# Patient Record
Sex: Male | Born: 1980 | Race: Black or African American | Hispanic: Refuse to answer | Marital: Married | Smoking: Never smoker
Health system: Southern US, Community
[De-identification: ages and names within clinical notes are randomized; demographics above are authoritative.]

## PROBLEM LIST (undated history)

## (undated) DIAGNOSIS — I10 Essential (primary) hypertension: Secondary | ICD-10-CM

## (undated) DIAGNOSIS — E669 Obesity, unspecified: Secondary | ICD-10-CM

## (undated) DIAGNOSIS — T7840XA Allergy, unspecified, initial encounter: Secondary | ICD-10-CM

## (undated) DIAGNOSIS — E201 Pseudohypoparathyroidism: Secondary | ICD-10-CM

## (undated) HISTORY — DX: Allergy, unspecified, initial encounter: T78.40XA

---

## 2004-08-02 DIAGNOSIS — I10 Essential (primary) hypertension: Secondary | ICD-10-CM

## 2004-08-02 HISTORY — DX: Essential (primary) hypertension: I10

## 2014-08-12 ENCOUNTER — Emergency Department (HOSPITAL_COMMUNITY): Payer: BC Managed Care – PPO

## 2014-08-12 ENCOUNTER — Emergency Department (HOSPITAL_COMMUNITY)
Admission: EM | Admit: 2014-08-12 | Discharge: 2014-08-12 | Disposition: A | Discharge status: Home / Self Care | Payer: BC Managed Care – PPO | Attending: Emergency Medicine | Admitting: Emergency Medicine

## 2014-08-12 ENCOUNTER — Encounter (HOSPITAL_COMMUNITY): Payer: Self-pay | Admitting: Family Medicine

## 2014-08-12 DIAGNOSIS — I10 Essential (primary) hypertension: Secondary | ICD-10-CM | POA: Insufficient documentation

## 2014-08-12 DIAGNOSIS — J189 Pneumonia, unspecified organism: Secondary | ICD-10-CM

## 2014-08-12 DIAGNOSIS — J159 Unspecified bacterial pneumonia: Secondary | ICD-10-CM | POA: Diagnosis not present

## 2014-08-12 DIAGNOSIS — E669 Obesity, unspecified: Secondary | ICD-10-CM | POA: Insufficient documentation

## 2014-08-12 DIAGNOSIS — R059 Cough, unspecified: Secondary | ICD-10-CM

## 2014-08-12 DIAGNOSIS — R05 Cough: Secondary | ICD-10-CM

## 2014-08-12 HISTORY — DX: Essential (primary) hypertension: I10

## 2014-08-12 LAB — BASIC METABOLIC PANEL
Anion gap: 8 (ref 5–15)
BUN: <5 mg/dL — ABNORMAL LOW (ref 6–23)
CHLORIDE: 97 meq/L (ref 96–112)
CO2: 30 mmol/L (ref 19–32)
Calcium: 6.4 mg/dL — CL (ref 8.4–10.5)
Creatinine, Ser: 0.88 mg/dL (ref 0.50–1.35)
GFR calc non Af Amer: >90 mL/min (ref >90)
GLUCOSE: 126 mg/dL — AB (ref 70–99)
Potassium: 3.2 mmol/L — ABNORMAL LOW (ref 3.5–5.1)
SODIUM: 135 mmol/L (ref 135–145)

## 2014-08-12 LAB — BRAIN NATRIURETIC PEPTIDE: B Natriuretic Peptide: 48.4 pg/mL (ref 0.0–100.0)

## 2014-08-12 LAB — CBC
HCT: 40 % (ref 39.0–52.0)
HEMOGLOBIN: 13.4 g/dL (ref 13.0–17.0)
MCH: 30.1 pg (ref 26.0–34.0)
MCHC: 33.5 g/dL (ref 30.0–36.0)
MCV: 89.9 fL (ref 78.0–100.0)
Platelets: 287 10*3/uL (ref 150–400)
RBC: 4.45 MIL/uL (ref 4.22–5.81)
RDW: 14.9 % (ref 11.5–15.5)
WBC: 12.3 10*3/uL — ABNORMAL HIGH (ref 4.0–10.5)

## 2014-08-12 LAB — I-STAT TROPONIN, ED: TROPONIN I, POC: 0 ng/mL (ref 0.00–0.08)

## 2014-08-12 MED ORDER — AZITHROMYCIN 250 MG PO TABS
500.0000 mg | ORAL_TABLET | Freq: Once | ORAL | Status: AC
  Administered 2014-08-12: 500 mg via ORAL
  Filled 2014-08-12: qty 2

## 2014-08-12 MED ORDER — AZITHROMYCIN 250 MG PO TABS: 250.0000 mg | ORAL_TABLET | Freq: Every day | ORAL | Status: DC

## 2014-08-12 MED ORDER — ACETAMINOPHEN 325 MG PO TABS
650.0000 mg | ORAL_TABLET | Freq: Once | ORAL | Status: AC
  Administered 2014-08-12: 650 mg via ORAL
  Filled 2014-08-12: qty 2

## 2014-08-12 MED ORDER — AMLODIPINE BESYLATE 5 MG PO TABS: 5.0000 mg | ORAL_TABLET | Freq: Every day | ORAL | Status: DC

## 2014-08-12 MED ORDER — AMLODIPINE BESYLATE 5 MG PO TABS
5.0000 mg | ORAL_TABLET | Freq: Once | ORAL | Status: AC
  Administered 2014-08-12: 5 mg via ORAL
  Filled 2014-08-12: qty 1

## 2014-08-12 NOTE — ED Notes (Signed)
PT returned from scans. Monitored by pulse ox, bp cuff, and 5-lead. 

## 2014-08-12 NOTE — ED Notes (Signed)
Pt sent here from [facility] with BP in the 200s. sts hx of hypertension not taking any meds. sts a cough and some hoarseness but denies chest pain, SOB.

## 2014-08-12 NOTE — ED Notes (Signed)
Pt given education on medications. MD aware of Pts BP and had prescribed medication

## 2014-08-12 NOTE — Discharge Instructions (Signed)
Pneumonia °Pneumonia is an infection of the lungs.  °CAUSES °Pneumonia may be caused by bacteria or a virus. Usually, these infections are caused by breathing infectious particles into the lungs (respiratory tract). °SIGNS AND SYMPTOMS  °· Cough. °· Fever. °· Chest pain. °· Increased rate of breathing. °· Wheezing. °· Mucus production. °DIAGNOSIS  °If you have the common symptoms of pneumonia, your health care provider will typically confirm the diagnosis with a chest X-ray. The X-ray will show an abnormality in the lung (pulmonary infiltrate) if you have pneumonia. Other tests of your blood, urine, or sputum may be done to find the specific cause of your pneumonia. Your health care provider may also do tests (blood gases or pulse oximetry) to see how well your lungs are working. °TREATMENT  °Some forms of pneumonia may be spread to other people when you cough or sneeze. You may be asked to wear a mask before and during your exam. Pneumonia that is caused by bacteria is treated with antibiotic medicine. Pneumonia that is caused by the influenza virus may be treated with an antiviral medicine. Most other viral infections must run their course. These infections will not respond to antibiotics.  °HOME CARE INSTRUCTIONS  °· Cough suppressants may be used if you are losing too much rest. However, coughing protects you by clearing your lungs. You should avoid using cough suppressants if you can. °· Your health care provider may have prescribed medicine if he or she thinks your pneumonia is caused by bacteria or influenza. Finish your medicine even if you start to feel better. °· Your health care provider may also prescribe an expectorant. This loosens the mucus to be coughed up. °· Take medicines only as directed by your health care provider. °· Do not smoke. Smoking is a common cause of bronchitis and can contribute to pneumonia. If you are a smoker and continue to smoke, your cough may last several weeks after your  pneumonia has cleared. °· A cold steam vaporizer or humidifier in your room or home may help loosen mucus. °· Coughing is often worse at night. Sleeping in a semi-upright position in a recliner or using a couple pillows under your head will help with this. °· Get rest as you feel it is needed. Your body will usually let you know when you need to rest. °PREVENTION °A pneumococcal shot (vaccine) is available to prevent a common bacterial cause of pneumonia. This is usually suggested for: °· People over 65 years old. °· Patients on chemotherapy. °· People with chronic lung problems, such as bronchitis or emphysema. °· People with immune system problems. °If you are over 65 or have a high risk condition, you may receive the pneumococcal vaccine if you have not received it before. In some countries, a routine influenza vaccine is also recommended. This vaccine can help prevent some cases of pneumonia. You may be offered the influenza vaccine as part of your care. °If you smoke, it is time to quit. You may receive instructions on how to stop smoking. Your health care provider can provide medicines and counseling to help you quit. °SEEK MEDICAL CARE IF: °You have a fever. °SEEK IMMEDIATE MEDICAL CARE IF:  °· Your illness becomes worse. This is especially true if you are elderly or weakened from any other disease. °· You cannot control your cough with suppressants and are losing sleep. °· You begin coughing up blood. °· You develop pain which is getting worse or is uncontrolled with medicines. °· Any of the symptoms   which initially brought you in for treatment are getting worse rather than better.  You develop shortness of breath or chest pain. MAKE SURE YOU:   Understand these instructions.  Will watch your condition.  Will get help right away if you are not doing well or get worse. Document Released: 07/19/2005 Document Revised: 12/03/2013 Document Reviewed: 10/08/2010 [facility] Patient Information 2015  [place], [place]. This information is not intended to replace advice given to you by your health care provider. Make sure you discuss any questions you have with your health care provider.  Hypertension Hypertension, commonly called high blood pressure, is when the force of blood pumping through your arteries is too strong. Your arteries are the blood vessels that carry blood from your heart throughout your body. A blood pressure reading consists of a higher number over a lower number, such as 110/72. The higher number (systolic) is the pressure inside your arteries when your heart pumps. The lower number (diastolic) is the pressure inside your arteries when your heart relaxes. Ideally you want your blood pressure below 120/80. Hypertension forces your heart to work harder to pump blood. Your arteries may become narrow or stiff. Having hypertension puts you at risk for heart disease, stroke, and other problems.  RISK FACTORS Some risk factors for high blood pressure are controllable. Others are not.  Risk factors you cannot control include:   Race. You may be at higher risk if you are African American.  Age. Risk increases with age.  Gender. Men are at higher risk than women before age 16 years. After age 65, women are at higher risk than men. Risk factors you can control include:  Not getting enough exercise or physical activity.  Being overweight.  Getting too much fat, sugar, calories, or salt in your diet.  Drinking too much alcohol. SIGNS AND SYMPTOMS Hypertension does not usually cause signs or symptoms. Extremely high blood pressure (hypertensive crisis) may cause headache, anxiety, shortness of breath, and nosebleed. DIAGNOSIS  To check if you have hypertension, your health care provider will measure your blood pressure while you are seated, with your arm held at the level of your heart. It should be measured at least twice using the same arm. Certain conditions can cause a difference  in blood pressure between your right and left arms. A blood pressure reading that is higher than normal on one occasion does not mean that you need treatment. If one blood pressure reading is high, ask your health care provider about having it checked again. TREATMENT  Treating high blood pressure includes making lifestyle changes and possibly taking medicine. Living a healthy lifestyle can help lower high blood pressure. You may need to change some of your habits. Lifestyle changes may include:  Following the DASH diet. This diet is high in fruits, vegetables, and whole grains. It is low in salt, red meat, and added sugars.  Getting at least 2 hours of brisk physical activity every week.  Losing weight if necessary.  Not smoking.  Limiting alcoholic beverages.  Learning ways to reduce stress. If lifestyle changes are not enough to get your blood pressure under control, your health care provider may prescribe medicine. You may need to take more than one. Work closely with your health care provider to understand the risks and benefits. HOME CARE INSTRUCTIONS  Have your blood pressure rechecked as directed by your health care provider.   Take medicines only as directed by your health care provider. Follow the directions carefully. Blood pressure medicines  must be taken as prescribed. The medicine does not work as well when you skip doses. Skipping doses also puts you at risk for problems.   Do not smoke.   Monitor your blood pressure at home as directed by your health care provider. SEEK MEDICAL CARE IF:   You think you are having a reaction to medicines taken.  You have recurrent headaches or feel dizzy.  You have swelling in your ankles.  You have trouble with your vision. SEEK IMMEDIATE MEDICAL CARE IF:  You develop a severe headache or confusion.  You have unusual weakness, numbness, or feel faint.  You have severe chest or abdominal pain.  You vomit  repeatedly.  You have trouble breathing. MAKE SURE YOU:   Understand these instructions.  Will watch your condition.  Will get help right away if you are not doing well or get worse. Document Released: 07/19/2005 Document Revised: 12/03/2013 Document Reviewed: 05/11/2013 [facility] Patient Information 2015 [place], [place]. This information is not intended to replace advice given to you by your health care provider. Make sure you discuss any questions you have with your health care provider.

## 2014-08-12 NOTE — ED Provider Notes (Signed)
CSN: [id number]     Arrival date & time 08/12/14  1702 History   First MD Initiated Contact with Patient 08/12/14 1809     Chief Complaint  Patient presents with  . Cough  . Hypertension     (Consider location/radiation/quality/duration/timing/severity/associated sxs/prior Treatment) Patient is a 34 y.o. male presenting with cough and hypertension.  Cough Cough characteristics:  Non-productive Severity:  Moderate Onset quality:  Gradual Duration: several days. Timing:  Constant Progression:  Unchanged Chronicity:  New Context: upper respiratory infection   Relieved by:  Nothing Ineffective treatments: robitussin\ Associated symptoms: sinus congestion   Associated symptoms: no chest pain, no fever, no headaches and no shortness of breath   Hypertension This is a recurrent problem. Pertinent negatives include no chest pain, no abdominal pain, no headaches and no shortness of breath. He has tried nothing for the symptoms.    Past Medical History  Diagnosis Date  . Hypertension    History reviewed. No pertinent past surgical history. History reviewed. No pertinent family history. History  Substance Use Topics  . Smoking status: Never Smoker   . Smokeless tobacco: Not on file  . Alcohol Use: Not on file    Review of Systems  Constitutional: Negative for fever.  Respiratory: Positive for cough. Negative for shortness of breath.   Cardiovascular: Negative for chest pain.  Gastrointestinal: Negative for nausea, vomiting, abdominal pain and diarrhea.  Neurological: Negative for headaches.  All other systems reviewed and are negative.     Allergies  Review of patient's allergies indicates no known allergies.  Home Medications   Prior to Admission medications   Not on File   BP 207/106 mmHg  Pulse 109  Temp(Src) 99 F (37.2 C) (Oral)  Resp 21  Ht  (1.778 m)  Wt 370 lb (167.831 kg)  BMI 53.09 kg/m2  SpO2 95% Physical Exam  Constitutional: He is  oriented to person, place, and time. He appears well-developed and well-nourished. No distress.  obese  HENT:  Head: Normocephalic and atraumatic.  Mouth/Throat: Oropharynx is clear and moist.  Eyes: Conjunctivae are normal. Pupils are equal, round, and reactive to light. No scleral icterus.  Neck: Neck supple.  Cardiovascular: Normal rate, regular rhythm, normal heart sounds and intact distal pulses.   No murmur heard. Pulmonary/Chest: Effort normal and breath sounds normal. No stridor. No respiratory distress. He has no wheezes. He has no rales.  Abdominal: Soft. He exhibits no distension. There is no tenderness.  Musculoskeletal: Normal range of motion. He exhibits no edema.  Neurological: He is alert and oriented to person, place, and time.  Skin: Skin is warm and dry. No rash noted.  Psychiatric: He has a normal mood and affect. His behavior is normal.  Nursing note and vitals reviewed.   ED Course  Procedures (including critical care time) Labs Review Labs Reviewed  CBC - Abnormal; Notable for the following:    WBC 12.3 (*)    All other components within normal limits  BASIC METABOLIC PANEL - Abnormal; Notable for the following:    Potassium 3.2 (*)    Glucose, Bld 126 (*)    BUN <5 (*)    Calcium 6.4 (*)    All other components within normal limits  BRAIN NATRIURETIC PEPTIDE  I-STAT TROPOININ, ED    Imaging Review Dg Chest 2 View  08/12/2014   CLINICAL DATA:  Cough and cold.  Hypertension.  EXAM: CHEST  2 VIEW  COMPARISON:  None.  FINDINGS: There is a focal  area of abnormal density posteriorly of uptake is at the right base medially. There is also peribronchial thickening. Heart size and vascularity are normal. No effusions. No osseous abnormality.  IMPRESSION: Bronchitic changes with a focal area of slight infiltrate or atelectasis at the right base. I recommend a follow-up radiograph in 4 weeks to ensure clearing.   Electronically Signed   By: Geanie CooleyJim  Maxwell M.D.   On:  08/12/2014 19:31  All radiology studies independently viewed by me.       EKG Interpretation   Date/Time:  Monday August 12 2014 17:15:53 EST Ventricular Rate:  113 PR Interval:  150 QRS Duration: 84 QT Interval:  362 QTC Calculation: 496 R Axis:   48 Text Interpretation:  Sinus tachycardia Otherwise normal ECG No old  tracing to compare Confirmed by [facility]OFF[facility]  MD, TREY ([id number]) on 08/12/2014  6:22:31 PM      MDM   Final diagnoses:  CAP (community acquired pneumonia)  Essential hypertension    34 yo male presenting from urgent care secondary to hypertension.  He went to urgent care for evaluation of a cough.  Regarding his hypertension, he was on medications in the past, but doesn't recall which ones.   He denies chest pain, shortness of breath, headaches, confusion, decreased UOP.  His hypertension is a little less high when checked with a manual cuff.  He has no symptoms or signs of end organ damage.  Will start norvasc, but he will need outpatient follow up.    Regarding his cough, it appears mild.  However, his chest xray shows possible pneumonia.  He is also slightly tachycardic.  Plan to treat as CAP.  He is tolerating POs easily and was advised to maintain good hydration.  He appears stable for discharge home with outpatient follow up.     Candyce ChurnJohn David Husein Guedes III, MD 08/12/14 [phone]

## 2014-08-21 ENCOUNTER — Emergency Department (HOSPITAL_COMMUNITY): Payer: BC Managed Care – PPO

## 2014-08-21 ENCOUNTER — Encounter (HOSPITAL_COMMUNITY): Payer: Self-pay | Admitting: Physical Medicine and Rehabilitation

## 2014-08-21 ENCOUNTER — Emergency Department (HOSPITAL_COMMUNITY)
Admission: EM | Admit: 2014-08-21 | Discharge: 2014-08-21 | Disposition: A | Discharge status: Home / Self Care | Payer: BC Managed Care – PPO | Attending: Emergency Medicine | Admitting: Emergency Medicine

## 2014-08-21 DIAGNOSIS — I10 Essential (primary) hypertension: Secondary | ICD-10-CM | POA: Diagnosis not present

## 2014-08-21 DIAGNOSIS — R000 Tachycardia, unspecified: Secondary | ICD-10-CM | POA: Diagnosis not present

## 2014-08-21 DIAGNOSIS — Z79899 Other long term (current) drug therapy: Secondary | ICD-10-CM | POA: Diagnosis not present

## 2014-08-21 DIAGNOSIS — Z8639 Personal history of other endocrine, nutritional and metabolic disease: Secondary | ICD-10-CM | POA: Diagnosis not present

## 2014-08-21 DIAGNOSIS — J9801 Acute bronchospasm: Secondary | ICD-10-CM | POA: Insufficient documentation

## 2014-08-21 DIAGNOSIS — R05 Cough: Secondary | ICD-10-CM

## 2014-08-21 DIAGNOSIS — R059 Cough, unspecified: Secondary | ICD-10-CM

## 2014-08-21 HISTORY — DX: Pseudohypoparathyroidism: E20.1

## 2014-08-21 MED ORDER — ALBUTEROL SULFATE (2.5 MG/3ML) 0.083% IN NEBU: 5.0000 mg | INHALATION_SOLUTION | Freq: Once | RESPIRATORY_TRACT | Status: DC

## 2014-08-21 MED ORDER — ALBUTEROL SULFATE HFA 108 (90 BASE) MCG/ACT IN AERS
2.0000 | INHALATION_SPRAY | Freq: Once | RESPIRATORY_TRACT | Status: AC
  Administered 2014-08-21: 2 via RESPIRATORY_TRACT
  Filled 2014-08-21: qty 6.7

## 2014-08-21 NOTE — ED Notes (Signed)
Pt presents to department for evaluation of cough and feeling of being "bloated." pt states he was recently treated for pneumonia, states he is unable to sleep due to coughing, also states "my stomach feels full." denies pain at present. Pt is alert and oriented x4.

## 2014-08-21 NOTE — Discharge Instructions (Signed)
Use albuterol inhaler every 4-6 hours as needed for cough and wheezing. Follow-up with your primary care physician.  Bronchospasm A bronchospasm is a spasm or tightening of the airways going into the lungs. During a bronchospasm breathing becomes more difficult because the airways get smaller. When this happens there can be coughing, a whistling sound when breathing (wheezing), and difficulty breathing. Bronchospasm is often associated with asthma, but not all patients who experience a bronchospasm have asthma. CAUSES  A bronchospasm is caused by inflammation or irritation of the airways. The inflammation or irritation may be triggered by:   Allergies (such as to animals, pollen, food, or mold). Allergens that cause bronchospasm may cause wheezing immediately after exposure or many hours later.   Infection. Viral infections are believed to be the most common cause of bronchospasm.   Exercise.   Irritants (such as pollution, cigarette smoke, strong odors, aerosol sprays, and paint fumes).   Weather changes. Winds increase molds and pollens in the air. Rain refreshes the air by washing irritants out. Cold air may cause inflammation.   Stress and emotional upset.  SIGNS AND SYMPTOMS   Wheezing.   Excessive nighttime coughing.   Frequent or severe coughing with a simple cold.   Chest tightness.   Shortness of breath.  DIAGNOSIS  Bronchospasm is usually diagnosed through a history and physical exam. Tests, such as chest X-rays, are sometimes done to look for other conditions. TREATMENT   Inhaled medicines can be given to open up your airways and help you breathe. The medicines can be given using either an inhaler or a nebulizer machine.  Corticosteroid medicines may be given for severe bronchospasm, usually when it is associated with asthma. HOME CARE INSTRUCTIONS   Always have a plan prepared for seeking medical care. Know when to call your health care provider and local  emergency services (911 in the [place] Know where you can access local emergency care.  Only take medicines as directed by your health care provider.  If you were prescribed an inhaler or nebulizer machine, ask your health care provider to explain how to use it correctly. Always use a spacer with your inhaler if you were given one.  It is necessary to remain calm during an attack. Try to relax and breathe more slowly.  Control your home environment in the following ways:   Change your heating and air conditioning filter at least once a month.   Limit your use of fireplaces and wood stoves.  Do not smoke and do not allow smoking in your home.   Avoid exposure to perfumes and fragrances.   Get rid of pests (such as roaches and mice) and their droppings.   Throw away plants if you see mold on them.   Keep your house clean and dust free.   Replace carpet with wood, tile, or vinyl flooring. Carpet can trap dander and dust.   Use allergy-proof pillows, mattress covers, and box spring covers.   Wash bed sheets and blankets every week in hot water and dry them in a dryer.   Use blankets that are made of polyester or cotton.   Wash hands frequently. SEEK MEDICAL CARE IF:   You have muscle aches.   You have chest pain.   The sputum changes from clear or white to yellow, green, gray, or bloody.   The sputum you cough up gets thicker.   There are problems that may be related to the medicine you are given, such as a rash, itching,  swelling, or trouble breathing.  SEEK IMMEDIATE MEDICAL CARE IF:   You have worsening wheezing and coughing even after taking your prescribed medicines.   You have increased difficulty breathing.   You develop severe chest pain. MAKE SURE YOU:   Understand these instructions.  Will watch your condition.  Will get help right away if you are not doing well or get worse. Document Released: 07/22/2003 Document Revised: 07/24/2013  Document Reviewed: 01/08/2013 [facility] Patient Information 2015 [place], [place]. This information is not intended to replace advice given to you by your health care provider. Make sure you discuss any questions you have with your health care provider.  Cough, Adult  A cough is a reflex that helps clear your throat and airways. It can help heal the body or may be a reaction to an irritated airway. A cough may only last 2 or 3 weeks (acute) or may last more than 8 weeks (chronic).  CAUSES Acute cough:  Viral or bacterial infections. Chronic cough:  Infections.  Allergies.  Asthma.  Post-nasal drip.  Smoking.  Heartburn or acid reflux.  Some medicines.  Chronic lung problems (COPD).  Cancer. SYMPTOMS   Cough.  Fever.  Chest pain.  Increased breathing rate.  High-pitched whistling sound when breathing (wheezing).  Colored mucus that you cough up (sputum). TREATMENT   A bacterial cough may be treated with antibiotic medicine.  A viral cough must run its course and will not respond to antibiotics.  Your caregiver may recommend other treatments if you have a chronic cough. HOME CARE INSTRUCTIONS   Only take over-the-counter or prescription medicines for pain, discomfort, or fever as directed by your caregiver. Use cough suppressants only as directed by your caregiver.  Use a cold steam vaporizer or humidifier in your bedroom or home to help loosen secretions.  Sleep in a semi-upright position if your cough is worse at night.  Rest as needed.  Stop smoking if you smoke. SEEK IMMEDIATE MEDICAL CARE IF:   You have pus in your sputum.  Your cough starts to worsen.  You cannot control your cough with suppressants and are losing sleep.  You begin coughing up blood.  You have difficulty breathing.  You develop pain which is getting worse or is uncontrolled with medicine.  You have a fever. MAKE SURE YOU:   Understand these instructions.  Will watch your  condition.  Will get help right away if you are not doing well or get worse. Document Released: 01/15/2011 Document Revised: 10/11/2011 Document Reviewed: 01/15/2011 [facility] Patient Information 2015 [place], [place]. This information is not intended to replace advice given to you by your health care provider. Make sure you discuss any questions you have with your health care provider.

## 2014-08-21 NOTE — ED Notes (Signed)
PA in room

## 2014-08-21 NOTE — ED Provider Notes (Signed)
CSN: [id number]     Arrival date & time 08/21/14  1420 History   First MD Initiated Contact with Patient 08/21/14 1432     Chief Complaint  Patient presents with  . Cough  . Bloated     (Consider location/radiation/quality/duration/timing/severity/associated sxs/prior Treatment) HPI Comments: 34 year old obese male with a past medical history of hypertension and pseudohypoparathyroidism presenting to the ED with continued cough after being diagnosed with pneumonia on January 11. Patient reports the cough has somewhat improved, however is worse at night and he is occasionally wheezing. He completed a course of azithromycin. Since starting azithromycin, he states his abdomen feels bloated and has had a few episodes of nonbloody diarrhea. Denies fever, chills, nausea or vomiting. He was also started on amlodipine which she has been taking as he was told his blood pressure was elevated at the last visit. Mom states the patient has seasonal allergies and he has been outside in the cold a lot.  Patient is a 34 y.o. male presenting with cough. The history is provided by the patient and a parent.  Cough Associated symptoms: wheezing     Past Medical History  Diagnosis Date  . Hypertension   . Pseudohypoparathyroidism    No past surgical history on file. No family history on file. History  Substance Use Topics  . Smoking status: Never Smoker   . Smokeless tobacco: Not on file  . Alcohol Use: No    Review of Systems  Respiratory: Positive for cough and wheezing.   Gastrointestinal: Positive for diarrhea.       + Bloating.  All other systems reviewed and are negative.     Allergies  Review of patient's allergies indicates no known allergies.  Home Medications   Prior to Admission medications   Medication Sig Start Date End Date Taking? Authorizing Provider  amLODipine (NORVASC) 5 MG tablet Take 1 tablet (5 mg total) by mouth daily. 08/12/14   Candyce ChurnJohn David Wofford III, MD   azithromycin (ZITHROMAX) 250 MG tablet Take 1 tablet (250 mg total) by mouth daily. 08/13/14   Candyce ChurnJohn David Wofford III, MD   BP 174/102 mmHg  Pulse 106  Temp(Src) 98.9 F (37.2 C) (Oral)  Resp 22  Ht 5\' 9"  (1.753 m)  Wt 370 lb (167.831 kg)  BMI 54.61 kg/m2  SpO2 94% Physical Exam  Constitutional: He is oriented to person, place, and time. He appears well-developed and well-nourished. No distress.  HENT:  Head: Normocephalic and atraumatic.  Mouth/Throat: Oropharynx is clear and moist.  Eyes: Conjunctivae and EOM are normal. Pupils are equal, round, and reactive to light.  Neck: Normal range of motion. Neck supple.  Cardiovascular: Regular rhythm and normal heart sounds.   Mild tachycardia. No extremity edema.  Pulmonary/Chest: Effort normal. He has no rhonchi. He has no rales.  Faint expiratory wheezes bilateral.  Abdominal: Soft. Bowel sounds are normal. He exhibits no distension. There is no tenderness.  Musculoskeletal: Normal range of motion. He exhibits no edema.  Neurological: He is alert and oriented to person, place, and time.  Skin: Skin is warm and dry.  Psychiatric: He has a normal mood and affect. His behavior is normal.  Nursing note and vitals reviewed.   ED Course  Procedures (including critical care time) Labs Review Labs Reviewed - No data to display  Imaging Review Dg Chest 2 View  08/21/2014   CLINICAL DATA:  34 year old male with recent pneumonia. Now unrelenting feeling of bloating. Initial encounter.  EXAM: CHEST  2 VIEW  COMPARISON:  08/12/2014.  FINDINGS: Large body habitus. Interval resolved Mild streaky opacity in the right lower lobe. No pleural effusion. No new pulmonary opacity. Normal cardiac size and mediastinal contours. Visualized tracheal air column is within normal limits. No pneumothorax or pneumoperitoneum. Negative visible bowel gas pattern. No acute osseous abnormality identified.  IMPRESSION: Right lower lobe bronchopneumonia appears  resolved. No new cardiopulmonary abnormality.   Electronically Signed   By: Augusto Gamble M.D.   On: 08/21/2014 15:50     EKG Interpretation None      MDM   Final diagnoses:  Cough  Bronchospasm   Patient in no apparent distress. O2 sat 94% on room air. Mild tachycardia, however it is noted at his prior visit he was more tachycardic than he is at this time. No associated chest pain or shortness of breath. Chest x-ray showing the right lower lobe bronchopneumonia resolved. Blood pressure elevated at 174/102, however this is significantly improved from his prior visit where his blood pressure was consistently in the 200s over 110s. He has an appointment in 5 days with his PCP for further discussion of his blood pressure. No headaches or vision change. Given the faint expiratory wheezes, albuterol inhaler given. Mom states there is a lot of dust in the apartment and he has allergies. Advised him to continue BP meds and f/u with PCP. Stable for d/c. Return precautions given. Patient states understanding of treatment care plan and is agreeable.   Kathrynn Speed, PA-C 08/21/14 1612  Samuel Jester, DO 08/24/14 1343

## 2014-08-26 ENCOUNTER — Encounter: Payer: Self-pay | Admitting: Family Medicine

## 2014-08-26 ENCOUNTER — Ambulatory Visit: Payer: BC Managed Care – PPO | Attending: Family Medicine | Admitting: Family Medicine

## 2014-08-26 VITALS — BP 196/108 | HR 106 | Temp 98.4°F | Resp 20 | Ht 69.0 in | Wt 348.2 lb

## 2014-08-26 DIAGNOSIS — Z113 Encounter for screening for infections with a predominantly sexual mode of transmission: Secondary | ICD-10-CM

## 2014-08-26 DIAGNOSIS — J189 Pneumonia, unspecified organism: Secondary | ICD-10-CM | POA: Diagnosis not present

## 2014-08-26 DIAGNOSIS — I10 Essential (primary) hypertension: Secondary | ICD-10-CM | POA: Diagnosis not present

## 2014-08-26 DIAGNOSIS — J309 Allergic rhinitis, unspecified: Secondary | ICD-10-CM | POA: Insufficient documentation

## 2014-08-26 DIAGNOSIS — E201 Pseudohypoparathyroidism: Secondary | ICD-10-CM | POA: Diagnosis not present

## 2014-08-26 DIAGNOSIS — Z6841 Body Mass Index (BMI) 40.0 and over, adult: Secondary | ICD-10-CM | POA: Diagnosis not present

## 2014-08-26 DIAGNOSIS — Z114 Encounter for screening for human immunodeficiency virus [HIV]: Secondary | ICD-10-CM | POA: Insufficient documentation

## 2014-08-26 DIAGNOSIS — E209 Hypoparathyroidism, unspecified: Secondary | ICD-10-CM | POA: Insufficient documentation

## 2014-08-26 DIAGNOSIS — IMO0001 Reserved for inherently not codable concepts without codable children: Secondary | ICD-10-CM

## 2014-08-26 DIAGNOSIS — R030 Elevated blood-pressure reading, without diagnosis of hypertension: Secondary | ICD-10-CM

## 2014-08-26 DIAGNOSIS — E876 Hypokalemia: Secondary | ICD-10-CM | POA: Insufficient documentation

## 2014-08-26 LAB — COMPLETE METABOLIC PANEL WITH GFR
ALT: 56 U/L — ABNORMAL HIGH (ref 0–53)
AST: 33 U/L (ref 0–37)
Albumin: 3.9 g/dL (ref 3.5–5.2)
Alkaline Phosphatase: 88 U/L (ref 39–117)
BUN: 11 mg/dL (ref 6–23)
CALCIUM: 7.4 mg/dL — AB (ref 8.4–10.5)
CHLORIDE: 99 meq/L (ref 96–112)
CO2: 25 meq/L (ref 19–32)
Creat: 0.79 mg/dL (ref 0.50–1.35)
GFR, Est African American: >89 mL/min
GFR, Est Non African American: >89 mL/min
Glucose, Bld: 109 mg/dL — ABNORMAL HIGH (ref 70–99)
POTASSIUM: 3.2 meq/L — AB (ref 3.5–5.3)
Sodium: 141 mEq/L (ref 135–145)
TOTAL PROTEIN: 7.5 g/dL (ref 6.0–8.3)
Total Bilirubin: 0.4 mg/dL (ref 0.2–1.2)

## 2014-08-26 LAB — LIPID PANEL
Cholesterol: 171 mg/dL (ref 0–200)
HDL: 29 mg/dL — AB (ref >39)
LDL Cholesterol: 116 mg/dL — ABNORMAL HIGH (ref 0–99)
Total CHOL/HDL Ratio: 5.9 Ratio
Triglycerides: 132 mg/dL (ref <150)
VLDL: 26 mg/dL (ref 0–40)

## 2014-08-26 LAB — POCT URINALYSIS DIPSTICK
BILIRUBIN UA: NEGATIVE
Blood, UA: NEGATIVE
Glucose, UA: NEGATIVE
KETONES UA: NEGATIVE
Leukocytes, UA: NEGATIVE
NITRITE UA: NEGATIVE
SPEC GRAV UA: 1.015
UROBILINOGEN UA: 0.2
pH, UA: 7

## 2014-08-26 LAB — HIV ANTIBODY (ROUTINE TESTING W REFLEX): HIV 1&2 Ab, 4th Generation: NONREACTIVE

## 2014-08-26 LAB — TSH: TSH: 1.333 u[IU]/mL (ref 0.350–4.500)

## 2014-08-26 LAB — POCT GLYCOSYLATED HEMOGLOBIN (HGB A1C): Hemoglobin A1C: 6

## 2014-08-26 LAB — GLUCOSE, POCT (MANUAL RESULT ENTRY): POC GLUCOSE: 138 mg/dL — AB (ref 70–99)

## 2014-08-26 MED ORDER — DILTIAZEM HCL ER BEADS 240 MG PO CP24: 240.0000 mg | ORAL_CAPSULE | Freq: Every day | ORAL | Status: DC

## 2014-08-26 MED ORDER — CLONIDINE HCL 0.1 MG PO TABS
0.2000 mg | ORAL_TABLET | Freq: Once | ORAL | Status: AC
  Administered 2014-08-26: 0.2 mg via ORAL

## 2014-08-26 MED ORDER — LISINOPRIL 10 MG PO TABS: 10.0000 mg | ORAL_TABLET | Freq: Every day | ORAL | Status: DC

## 2014-08-26 MED ORDER — FLUTICASONE PROPIONATE 50 MCG/ACT NA SUSP: 2.0000 | Freq: Every day | NASAL | Status: DC

## 2014-08-26 NOTE — Patient Instructions (Addendum)
[name],  Thank you for coming in today. It was a pleasure meeting you. I look forward to being your primary doctor.   1. HTN: goal BP is < 140/90 at all times. STOP norvasc Start diltiazem 240 mg once daily, this will treat BP and heart rate. Also take lisinopril 10 mg daily. Over the next 4-6 weeks we will titrate up dose to get BP to goal.  Low salt diet, increase exercise as tolerated by adding walking 20-30 minutes 3-4 days per week.   2. Allergic rhinitis with swelling on R side: nasal steroid.  Continue albuterol as needed. Limit this to lowest dose needed as overuse of albuterol can increase heart rate and lower potassium.  You will be called with lab results.  F/u in one week for RN BP check, if BP still above goal but oyu are tolerating the medication the plan will be to increase lisinopril to 20 mg daily (1/2 max)  and diltiazem to 480 mg daily (max)  F/u with me in 3 weeks for HTN.   Dr. Armen Hahn DASH Eating Plan DASH stands for "Dietary Approaches to Stop Hypertension." The DASH eating plan is a healthy eating plan that has been shown to reduce high blood pressure (hypertension). Additional health benefits may include reducing the risk of type 2 diabetes mellitus, heart disease, and stroke. The DASH eating plan may also help with weight loss. WHAT DO I NEED TO KNOW ABOUT THE DASH EATING PLAN? For the DASH eating plan, you will follow these general guidelines:  Choose foods with a percent daily value for sodium of less than 5% (as listed on the food label).  Use salt-free seasonings or herbs instead of table salt or sea salt.  Check with your health care provider or pharmacist before using salt substitutes.  Eat lower-sodium products, often labeled as "lower sodium" or "no salt added."  Eat fresh foods.  Eat more vegetables, fruits, and low-fat dairy products.  Choose whole grains. Look for the word "whole" as the first word in the ingredient list.  Choose fish  and skinless chicken or [place] more often than red meat. Limit fish, poultry, and meat to 6 oz (170 g) each day.  Limit sweets, desserts, sugars, and sugary drinks.  Choose heart-healthy fats.  Limit cheese to 1 oz (28 g) per day.  Eat more home-cooked food and less restaurant, buffet, and fast food.  Limit fried foods.  Cook foods using methods other than frying.  Limit canned vegetables. If you do use them, rinse them well to decrease the sodium.  When eating at a restaurant, ask that your food be prepared with less salt, or no salt if possible. WHAT FOODS CAN I EAT? Seek help from a dietitian for individual calorie needs. Grains Whole grain or whole wheat bread. Brown rice. Whole grain or whole wheat pasta. Quinoa, bulgur, and whole grain cereals. Low-sodium cereals. Corn or whole wheat flour tortillas. Whole grain cornbread. Whole grain crackers. Low-sodium crackers. Vegetables Fresh or frozen vegetables (raw, steamed, roasted, or grilled). Low-sodium or reduced-sodium tomato and vegetable juices. Low-sodium or reduced-sodium tomato sauce and paste. Low-sodium or reduced-sodium canned vegetables.  Fruits All fresh, canned (in natural juice), or frozen fruits. Meat and Other Protein Products Ground beef (85% or leaner), grass-fed beef, or beef trimmed of fat. Skinless chicken or [place]. Ground chicken or [place]. Pork trimmed of fat. All fish and seafood. Eggs. Dried beans, peas, or lentils. Unsalted nuts and seeds. Unsalted canned beans. Dairy Low-fat dairy  products, such as skim or 1% milk, 2% or reduced-fat cheeses, low-fat ricotta or cottage cheese, or plain low-fat yogurt. Low-sodium or reduced-sodium cheeses. Fats and Oils Tub margarines without trans fats. Light or reduced-fat mayonnaise and salad dressings (reduced sodium). Avocado. Safflower, olive, or canola oils. Natural peanut or almond butter. Other Unsalted popcorn and pretzels. The items listed above may not be a  complete list of recommended foods or beverages. Contact your dietitian for more options. WHAT FOODS ARE NOT RECOMMENDED? Grains White bread. White pasta. White rice. Refined cornbread. Bagels and croissants. Crackers that contain trans fat. Vegetables Creamed or fried vegetables. Vegetables in a cheese sauce. Regular canned vegetables. Regular canned tomato sauce and paste. Regular tomato and vegetable juices. Fruits Dried fruits. Canned fruit in light or heavy syrup. Fruit juice. Meat and Other Protein Products Fatty cuts of meat. Ribs, chicken wings, bacon, sausage, bologna, salami, chitterlings, fatback, hot dogs, bratwurst, and packaged luncheon meats. Salted nuts and seeds. Canned beans with salt. Dairy Whole or 2% milk, cream, half-and-half, and cream cheese. Whole-fat or sweetened yogurt. Full-fat cheeses or blue cheese. Nondairy creamers and whipped toppings. Processed cheese, cheese spreads, or cheese curds. Condiments Onion and garlic salt, seasoned salt, table salt, and sea salt. Canned and packaged gravies. Worcestershire sauce. Tartar sauce. Barbecue sauce. Teriyaki sauce. Soy sauce, including reduced sodium. Steak sauce. Fish sauce. Oyster sauce. Cocktail sauce. Horseradish. Ketchup and mustard. Meat flavorings and tenderizers. Bouillon cubes. Hot sauce. Tabasco sauce. Marinades. Taco seasonings. Relishes. Fats and Oils Butter, stick margarine, lard, shortening, ghee, and bacon fat. Coconut, palm kernel, or palm oils. Regular salad dressings. Other Pickles and olives. Salted popcorn and pretzels. The items listed above may not be a complete list of foods and beverages to avoid. Contact your dietitian for more information. WHERE CAN I FIND MORE INFORMATION? National Heart, Lung, and Blood Institute: [employer] Document Released: 07/08/2011 Document Revised: 12/03/2013 Document Reviewed: 05/23/2013 [facility] Patient Information 2015  [place], [place]. This information is not intended to replace advice given to you by your health care provider. Make sure you discuss any questions you have with your health care provider.

## 2014-08-26 NOTE — Assessment & Plan Note (Addendum)
A: elevated above goal, with hypokalemia and tachycardia  P: D/c norvasc Start diltiazem 240 mg daily Lisinopril 10 mg daily Check CMP Check plasma renin/aldosterone level and ratio  Screening for diabetes negative

## 2014-08-26 NOTE — Assessment & Plan Note (Signed)
Screening HIV ordered  

## 2014-08-26 NOTE — Progress Notes (Signed)
   Subjective:    Patient ID: Peter Hahn, male    DOB: 09/24/1980, 34 y.o.   MRN: [id number] CC: HTN HPI 34 yo M NP:  1. HTN: dx in 2006. No previous treatment. Denies HA, CP, SOB, ext edema. Taking norvasc 5 mg daily. Has limited sodium. Has cut out caffeine.   2. Recent PNA: finished azithromycin. No CP or SOB. Some wheezing at night. Using albuterol. Grandma was sick contact with PNA. Having some R nasal stuffiness.   3. 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 Low calcium induced seizure. Patient irregularly taking calcium supplement, 500 mg TID recommended. No seizures. No tingling or numbness in face or extremities.   Soc Hx: non smoker  Med Hx: HTN dx in 2006,  Surg Hx: negative  Review of Systems As per HPI    Objective:   Physical Exam BP 218/100 mmHg  Pulse 113  Temp(Src) 98.4 F (36.9 C) (Oral)  Resp 20  Ht 5\' 9"  (1.753 m)  Wt 348 lb 3.2 oz (157.942 kg)  BMI 51.40 kg/m2  SpO2 94% General appearance: alert, cooperative, no distress and morbidly obese. Cheeks or ruddy.  Eyes: conjunctivae/corneas clear. PERRL, EOM's intact.  Nose: swollen turbinate R nares  Neck: no adenopathy, no carotid bruit, no JVD, supple, symmetrical, trachea midline and thyroid not enlarged, symmetric, no tenderness/mass/nodules Lungs: normal WOB, exp wheezing b/l, no crackles, no rhonhchi Heart: S1S2, RRR, tachy heart rate  Extremities: edema negative   CBG 138      Assessment & Plan:

## 2014-08-26 NOTE — Progress Notes (Signed)
Patient presents to establish care [facility] and was told he had HTN Started on 5 mg amlodipine Would like to lose weight Denies h/a, chest piain, blurred vision Declined flu vaccine due to recent pneumonia  BP 218/100 Left arm manually with large cuff P 113  Clonidine 0.2 mg given per protocol and PCP

## 2014-08-26 NOTE — Assessment & Plan Note (Signed)
2. Allergic rhinitis with swelling on R side: nasal steroid.  Continue albuterol as needed. Limit this to lowest dose needed as overuse of albuterol can increase heart rate and lower potassium.

## 2014-08-26 NOTE — Assessment & Plan Note (Signed)
Check PTH level Continue calcium supplement

## 2014-08-27 LAB — PARATHYROID HORMONE, INTACT (NO CA): PTH: 137 pg/mL — ABNORMAL HIGH (ref 14–64)

## 2014-08-27 LAB — MICROALBUMIN / CREATININE URINE RATIO
Creatinine, Urine: 143.5 mg/dL
Microalb Creat Ratio: 20.9 mg/g (ref 0.0–30.0)
Microalb, Ur: 3 mg/dL — ABNORMAL HIGH (ref <2.0)

## 2014-09-03 ENCOUNTER — Ambulatory Visit: Payer: BC Managed Care – PPO | Attending: Family Medicine | Admitting: *Deleted

## 2014-09-03 VITALS — BP 190/70 | HR 116 | Temp 99.6°F | Resp 22

## 2014-09-03 DIAGNOSIS — I10 Essential (primary) hypertension: Secondary | ICD-10-CM | POA: Diagnosis present

## 2014-09-03 LAB — ALDOSTERONE + RENIN ACTIVITY W/ RATIO
ALDO / PRA RATIO: 1.7 ratio (ref 0.9–28.9)
ALDOSTERONE: 7 ng/dL
PRA LC/MS/MS: 4.13 ng/mL/h (ref 0.25–5.82)

## 2014-09-03 MED ORDER — DILTIAZEM HCL ER BEADS 240 MG PO CP24: 480.0000 mg | ORAL_CAPSULE | Freq: Every day | ORAL | Status: DC

## 2014-09-03 NOTE — Patient Instructions (Signed)
DASH Eating Plan °DASH stands for "Dietary Approaches to Stop Hypertension." The DASH eating plan is a healthy eating plan that has been shown to reduce high blood pressure (hypertension). Additional health benefits may include reducing the risk of type 2 diabetes mellitus, heart disease, and stroke. The DASH eating plan may also help with weight loss. °WHAT DO I NEED TO KNOW ABOUT THE DASH EATING PLAN? °For the DASH eating plan, you will follow these general guidelines: °· Choose foods with a percent daily value for sodium of less than 5% (as listed on the food label). °· Use salt-free seasonings or herbs instead of table salt or sea salt. °· Check with your health care provider or pharmacist before using salt substitutes. °· Eat lower-sodium products, often labeled as "lower sodium" or "no salt added." °· Eat fresh foods. °· Eat more vegetables, fruits, and low-fat dairy products. °· Choose whole grains. Look for the word "whole" as the first word in the ingredient list. °· Choose fish and skinless chicken or turkey more often than red meat. Limit fish, poultry, and meat to 6 oz (170 g) each day. °· Limit sweets, desserts, sugars, and sugary drinks. °· Choose heart-healthy fats. °· Limit cheese to 1 oz (28 g) per day. °· Eat more home-cooked food and less restaurant, buffet, and fast food. °· Limit fried foods. °· Cook foods using methods other than frying. °· Limit canned vegetables. If you do use them, rinse them well to decrease the sodium. °· When eating at a restaurant, ask that your food be prepared with less salt, or no salt if possible. °WHAT FOODS CAN I EAT? °Seek help from a dietitian for individual calorie needs. °Grains °Whole grain or whole wheat bread. Brown rice. Whole grain or whole wheat pasta. Quinoa, bulgur, and whole grain cereals. Low-sodium cereals. Corn or whole wheat flour tortillas. Whole grain cornbread. Whole grain crackers. Low-sodium crackers. °Vegetables °Fresh or frozen vegetables  (raw, steamed, roasted, or grilled). Low-sodium or reduced-sodium tomato and vegetable juices. Low-sodium or reduced-sodium tomato sauce and paste. Low-sodium or reduced-sodium canned vegetables.  °Fruits °All fresh, canned (in natural juice), or frozen fruits. °Meat and Other Protein Products °Ground beef (85% or leaner), grass-fed beef, or beef trimmed of fat. Skinless chicken or turkey. Ground chicken or turkey. Pork trimmed of fat. All fish and seafood. Eggs. Dried beans, peas, or lentils. Unsalted nuts and seeds. Unsalted canned beans. °Dairy °Low-fat dairy products, such as skim or 1% milk, 2% or reduced-fat cheeses, low-fat ricotta or cottage cheese, or plain low-fat yogurt. Low-sodium or reduced-sodium cheeses. °Fats and Oils °Tub margarines without trans fats. Light or reduced-fat mayonnaise and salad dressings (reduced sodium). Avocado. Safflower, olive, or canola oils. Natural peanut or almond butter. °Other °Unsalted popcorn and pretzels. °The items listed above may not be a complete list of recommended foods or beverages. Contact your dietitian for more options. °WHAT FOODS ARE NOT RECOMMENDED? °Grains °White bread. White pasta. White rice. Refined cornbread. Bagels and croissants. Crackers that contain trans fat. °Vegetables °Creamed or fried vegetables. Vegetables in a cheese sauce. Regular canned vegetables. Regular canned tomato sauce and paste. Regular tomato and vegetable juices. °Fruits °Dried fruits. Canned fruit in light or heavy syrup. Fruit juice. °Meat and Other Protein Products °Fatty cuts of meat. Ribs, chicken wings, bacon, sausage, bologna, salami, chitterlings, fatback, hot dogs, bratwurst, and packaged luncheon meats. Salted nuts and seeds. Canned beans with salt. °Dairy °Whole or 2% milk, cream, half-and-half, and cream cheese. Whole-fat or sweetened yogurt. Full-fat   cheeses or blue cheese. Nondairy creamers and whipped toppings. Processed cheese, cheese spreads, or cheese  curds. °Condiments °Onion and garlic salt, seasoned salt, table salt, and sea salt. Canned and packaged gravies. Worcestershire sauce. Tartar sauce. Barbecue sauce. Teriyaki sauce. Soy sauce, including reduced sodium. Steak sauce. Fish sauce. Oyster sauce. Cocktail sauce. Horseradish. Ketchup and mustard. Meat flavorings and tenderizers. Bouillon cubes. Hot sauce. Tabasco sauce. Marinades. Taco seasonings. Relishes. °Fats and Oils °Butter, stick margarine, lard, shortening, ghee, and bacon fat. Coconut, palm kernel, or palm oils. Regular salad dressings. °Other °Pickles and olives. Salted popcorn and pretzels. °The items listed above may not be a complete list of foods and beverages to avoid. Contact your dietitian for more information. °WHERE CAN I FIND MORE INFORMATION? °National Heart, Lung, and Blood Institute: www.nhlbi.nih.gov/health/health-topics/topics/dash/ °Document Released: 07/08/2011 Document Revised: 12/03/2013 Document Reviewed: 05/23/2013 °[employer]® Patient Information ©2015 [employer]. This information is not intended to replace advice given to you by your health care provider. Make sure you discuss any questions you have with your health care provider. ° °

## 2014-09-03 NOTE — Progress Notes (Signed)
Patient presents for BP check Med list reviewed; states taking all meds as directed Discussed need for low sodium diet and using Mrs. Dash as alternative to salt. States he's been reading labels to choose low sodium options and has not had any fast food. States he has appt with dietician next Monday, 09/09/14 States he will start walking today and playing basketball Declined flu vaccine  BP 190/70  left arm manually with large cuff.   Patient states that he's been anxious all morning knowing he was coming today for BP check. States he could feel his heart "pounding" as soon as he walked into the office. States he has reacted to Dr's offices like this since childhood  States he is going to purchase BP cuff today to check at home. Instructed to record date/time and BP reading each time and bring record to next visit  P 116 verified manually R 22  T  99.6 oral  States he feels well. Denies cold sx  SPO2  96%  Per PCP: Increase diltiazem to 2 caps daily (480 mg total) F/u with PCP in 2 weeks. Will discuss lab results at that time.  Patient advised to call for med refills at least 7 days before running out so as not to go without.

## 2014-09-06 ENCOUNTER — Telehealth: Payer: Self-pay | Admitting: *Deleted

## 2014-09-06 MED ORDER — LISINOPRIL 20 MG PO TABS
20.0000 mg | ORAL_TABLET | Freq: Every day | ORAL | Status: DC
Start: 2014-09-06 — End: 2014-09-26

## 2014-09-06 MED ORDER — CALCIUM CITRATE 950 (200 CA) MG PO TABS: 200.0000 mg | ORAL_TABLET | Freq: Two times a day (BID) | ORAL | Status: DC

## 2014-09-06 NOTE — Addendum Note (Signed)
Addended by: Dessa PhiFUNCHES, Kaarin Pardy on: 09/06/2014 01:15 PM   Modules accepted: Orders

## 2014-09-06 NOTE — Telephone Encounter (Signed)
-----   Message from Lora PaulaJosalyn C Funches, MD sent at 09/06/2014  1:11 PM EST ----- Slightly low potassium Slightly elevated LDL High PTH with low calcium and normal kidneys consistent with pseudohypoparathyroidism  All other labs including renin and aldosterone activity and thyroid test normal  Plan:  Continue daily calcium supplement increase to twice daily  Increase lisinopril to 20 mg daily for BP  Will need to check phosphorous level

## 2014-09-06 NOTE — Telephone Encounter (Signed)
Pt aware of results and medicine changes

## 2014-09-09 ENCOUNTER — Telehealth: Payer: Self-pay | Admitting: Family Medicine

## 2014-09-09 ENCOUNTER — Encounter: Payer: BC Managed Care – PPO | Attending: Family Medicine | Admitting: Skilled Nursing Facility1

## 2014-09-09 ENCOUNTER — Encounter: Payer: Self-pay | Admitting: Skilled Nursing Facility1

## 2014-09-09 DIAGNOSIS — Z6841 Body Mass Index (BMI) 40.0 and over, adult: Secondary | ICD-10-CM | POA: Diagnosis not present

## 2014-09-09 DIAGNOSIS — Z713 Dietary counseling and surveillance: Secondary | ICD-10-CM | POA: Diagnosis not present

## 2014-09-09 NOTE — Patient Instructions (Signed)
-  Try to add a protein with your fruit like peanut butter with your banana or cheese cubes with your pineapple -Try to drink at least two cups of milk and eat one carton of yogurt -Try to add a serving of a vegetable to lunch like green beans or try sauteeing the chicken with peppers -Try switching up your oils from canola to olive and back -Try different cooking techniques -Try to move every day

## 2014-09-09 NOTE — Telephone Encounter (Signed)
Returned Pt call, was concerned about medication dose. notified used, pt verbalized understanding

## 2014-09-09 NOTE — Progress Notes (Signed)
  Medical Nutrition Therapy:  Appt start time: 0800 end time:  0900.   Assessment:  Primary concerns today: Referred for obesity. Patient states he has had parathyroidism since he was 34 years old. Patient states he had pneumonia one month ago when he discovered he has high blood pressure and he weighed 374 pounds he wanted to manage his weight. Patient was 374 pounds one month ago in hospital (according to patient, in record 370 pounds) which is about a 20 pound weight loss. Patient states the pneumonia medication caused a lot of bloat and once he was off that he probably lost water. Patient made dietary changes after he discovered how much he weighed:he stopped eating fast food and drinks more water; pizza is his favorite food but this past December he has stopped eating pizza and has no urge for it. Patient reports he has Been looking at the sodium content of food and was shocked at how much sodium was in the products he was eating. Patients Mom has been helping him to eat healthy by cooking all of his meals. Patient states he has also Switched to wheat bread. Patient states his Parents have recently moved in with him; they seem to be taking care of him i.e. Cooking for him. HDL 29, LDL 116, ALT 56, Potassium 3.2, A1C 6  Preferred Learning Style:   Auditory  Learning Readiness:   Change in progress   MEDICATIONS: Lisinopril, Calcium + Vitamin D, Tiazac   DIETARY INTAKE:  Usual eating pattern includes 3 meals and 2 snacks per day.  Everyday foods include chicken.  Avoided foods include pizza.    24-hr recall:  B ( AM): 2 eggs, 1 [place] bacon low sodium, 1 wheat toast------cereal with 2% milk Snk ( AM): 1-2 bananas L ( PM): 3-4 pieces of fried breaded chicken (diced small), wheat toast-------chicken salad Snk ( PM): banana and pineapples D ( PM): fried breaded chicken, cabbage----greens Snk ( PM): none Beverages: water, gingerale  Usual physical activity: ADL's  Estimated energy  needs: 1800 calories 200 g carbohydrates 135 g protein 50 g fat  Progress Towards Goal(s):  In progress.   Nutritional Diagnosis:  NB-2.1 Physical inactivity As related to lack of motivation.  As evidenced by patient report and BMI of 51.39.    Intervention:  Nutrition Counseling for obesity. Dietitian educated the patient on the importance of variety, balance, and portion control. Dietitian also educated the patient on the importance of physical activity and how to consume/absorb more calcium.  Goals:   -Try to add a protein with your fruit like peanut butter with your banana or cheese cubes with your pineapple -Try to drink at least two cups of milk and eat one carton of yogurt -Try to add a serving of a vegetable to lunch like green beans or try sauteeing the chicken with peppers -Try switching up your oils from canola to olive and back -Try different cooking techniques -Try to move every day  Teaching Method Utilized:   Visual Auditory   Handouts given during visit include:  Low sodium flavoring options  15 gram carbohydrate snack sheet  Barriers to learning/adherence to lifestyle change: fear of gaining weight.  Demonstrated degree of understanding via:  Teach Back   Monitoring/Evaluation:  Dietary intake, physical activity, calcium levels, A1C, LDL and body weight prn.

## 2014-09-09 NOTE — Telephone Encounter (Signed)
Patient called to speak to nurse about his recent medicine changes. Please f/u with pt.

## 2014-09-13 ENCOUNTER — Telehealth: Payer: Self-pay | Admitting: Family Medicine

## 2014-09-13 ENCOUNTER — Other Ambulatory Visit: Payer: Self-pay | Admitting: *Deleted

## 2014-09-13 DIAGNOSIS — I10 Essential (primary) hypertension: Secondary | ICD-10-CM

## 2014-09-13 MED ORDER — DILTIAZEM HCL ER BEADS 240 MG PO CP24: 480.0000 mg | ORAL_CAPSULE | Freq: Every day | ORAL | Status: DC

## 2014-09-13 NOTE — Telephone Encounter (Signed)
Pt spoke with the nurse and the nurse approved the following medication but the pharmacy will not fill. Please follow up with pharmacy and/or patient.  diltiazem (TIAZAC) 240 MG 24 hr capsule  Pt also needs a refill on:  lisinopril (PRINIVIL,ZESTRIL) 20 MG tablet

## 2014-09-15 ENCOUNTER — Encounter (HOSPITAL_COMMUNITY): Payer: Self-pay | Admitting: Physical Medicine and Rehabilitation

## 2014-09-15 ENCOUNTER — Emergency Department (HOSPITAL_COMMUNITY)
Admission: EM | Admit: 2014-09-15 | Discharge: 2014-09-15 | Disposition: A | Discharge status: Home / Self Care | Payer: BC Managed Care – PPO | Attending: Emergency Medicine | Admitting: Emergency Medicine

## 2014-09-15 ENCOUNTER — Emergency Department (HOSPITAL_COMMUNITY): Payer: BC Managed Care – PPO

## 2014-09-15 DIAGNOSIS — Z79899 Other long term (current) drug therapy: Secondary | ICD-10-CM | POA: Insufficient documentation

## 2014-09-15 DIAGNOSIS — I10 Essential (primary) hypertension: Secondary | ICD-10-CM | POA: Insufficient documentation

## 2014-09-15 DIAGNOSIS — R1013 Epigastric pain: Secondary | ICD-10-CM | POA: Diagnosis present

## 2014-09-15 DIAGNOSIS — Z8639 Personal history of other endocrine, nutritional and metabolic disease: Secondary | ICD-10-CM | POA: Diagnosis not present

## 2014-09-15 DIAGNOSIS — M25512 Pain in left shoulder: Secondary | ICD-10-CM | POA: Insufficient documentation

## 2014-09-15 DIAGNOSIS — R7989 Other specified abnormal findings of blood chemistry: Secondary | ICD-10-CM | POA: Diagnosis not present

## 2014-09-15 DIAGNOSIS — R945 Abnormal results of liver function studies: Secondary | ICD-10-CM

## 2014-09-15 LAB — URINALYSIS, ROUTINE W REFLEX MICROSCOPIC
Bilirubin Urine: NEGATIVE
Glucose, UA: NEGATIVE mg/dL
Hgb urine dipstick: NEGATIVE
Ketones, ur: NEGATIVE mg/dL
Leukocytes, UA: NEGATIVE
Nitrite: NEGATIVE
Protein, ur: NEGATIVE mg/dL
Specific Gravity, Urine: 1.024 (ref 1.005–1.030)
UROBILINOGEN UA: 0.2 mg/dL (ref 0.0–1.0)
pH: 5.5 (ref 5.0–8.0)

## 2014-09-15 LAB — COMPREHENSIVE METABOLIC PANEL
ALT: 56 U/L — ABNORMAL HIGH (ref 0–53)
ANION GAP: 9 (ref 5–15)
AST: 41 U/L — ABNORMAL HIGH (ref 0–37)
Albumin: 3.5 g/dL (ref 3.5–5.2)
Alkaline Phosphatase: 105 U/L (ref 39–117)
BUN: 14 mg/dL (ref 6–23)
CALCIUM: 7.8 mg/dL — AB (ref 8.4–10.5)
CHLORIDE: 101 mmol/L (ref 96–112)
CO2: 28 mmol/L (ref 19–32)
CREATININE: 1 mg/dL (ref 0.50–1.35)
GFR calc Af Amer: >90 mL/min (ref >90)
GFR calc non Af Amer: >90 mL/min (ref >90)
Glucose, Bld: 103 mg/dL — ABNORMAL HIGH (ref 70–99)
Potassium: 3.7 mmol/L (ref 3.5–5.1)
Sodium: 138 mmol/L (ref 135–145)
Total Bilirubin: 0.6 mg/dL (ref 0.3–1.2)
Total Protein: 7.6 g/dL (ref 6.0–8.3)

## 2014-09-15 LAB — CBC WITH DIFFERENTIAL/PLATELET
BASOS PCT: 0 % (ref 0–1)
Basophils Absolute: 0 10*3/uL (ref 0.0–0.1)
EOS ABS: 0.2 10*3/uL (ref 0.0–0.7)
EOS PCT: 1 % (ref 0–5)
HCT: 42 % (ref 39.0–52.0)
Hemoglobin: 13.9 g/dL (ref 13.0–17.0)
Lymphocytes Relative: 22 % (ref 12–46)
Lymphs Abs: 2.4 10*3/uL (ref 0.7–4.0)
MCH: 29.3 pg (ref 26.0–34.0)
MCHC: 33.1 g/dL (ref 30.0–36.0)
MCV: 88.6 fL (ref 78.0–100.0)
MONO ABS: 0.7 10*3/uL (ref 0.1–1.0)
MONOS PCT: 6 % (ref 3–12)
NEUTROS ABS: 7.8 10*3/uL — AB (ref 1.7–7.7)
NEUTROS PCT: 71 % (ref 43–77)
PLATELETS: 284 10*3/uL (ref 150–400)
RBC: 4.74 MIL/uL (ref 4.22–5.81)
RDW: 14.8 % (ref 11.5–15.5)
WBC: 11.1 10*3/uL — AB (ref 4.0–10.5)

## 2014-09-15 LAB — I-STAT TROPONIN, ED: TROPONIN I, POC: 0.01 ng/mL (ref 0.00–0.08)

## 2014-09-15 LAB — LIPASE, BLOOD: LIPASE: 19 U/L (ref 11–59)

## 2014-09-15 MED ORDER — OMEPRAZOLE 20 MG PO CPDR: 20.0000 mg | DELAYED_RELEASE_CAPSULE | Freq: Every day | ORAL | Status: DC

## 2014-09-15 NOTE — ED Provider Notes (Signed)
CSN: [id number]     Arrival date & time 09/15/14  1300 History   First MD Initiated Contact with Patient 09/15/14 1549     Chief Complaint  Patient presents with  . Abdominal Pain  . Shoulder Pain     (Consider location/radiation/quality/duration/timing/severity/associated sxs/prior Treatment) The history is provided by the patient and a relative.    Abdominal pain: Pt endorses several week history of mild bloating, worse after meals, and seems to be related to eating fatty/unhealthy meals. He describes the pain as mild, primarily epigastric, and resolved with burping or passing flatus. He has a h/o similar pain for years. No vomiting or diarrhea. No fever or chills. No recent abdominal trauma. Denies any recent sick contacts. No history of prior abdominal surgeries. He has not taken anything for it. No associated chest pain or SOB, palpitations, or other symptoms.   Shoulder pain: Patient endorses mild, aching, left sided shoulder/back pain that began after sleeping on his shoulder on a couch. Patient states he always has "aches and pains" from sleeping on sofas and this feels similar to his usual pain. He describes it as a dull, aching sensation that was relieved completely with rubbing and OTC "icy hot." No radiation to the chest. Pain reproduced with any movement but does not seem exertional. No recent trauma to the area. No associated SOB.   Past Medical History  Diagnosis Date  . Pseudohypoparathyroidism   . Hypertension 2006    first told his BP was high, never treated    History reviewed. No pertinent past surgical history. Family History  Problem Relation Age of Onset  . Diabetes Mother   . Diabetes Father   . Hypertension Other    History  Substance Use Topics  . Smoking status: Never Smoker   . Smokeless tobacco: Not on file  . Alcohol Use: No    Review of Systems  Constitutional: Negative for fever and chills.  HENT: Negative for congestion, rhinorrhea and sore  throat.   Eyes: Negative for visual disturbance.  Respiratory: Negative for cough, shortness of breath and wheezing.   Cardiovascular: Negative for chest pain and leg swelling.  Gastrointestinal: Negative for nausea, vomiting, diarrhea, constipation and abdominal distention.       + bloating  Musculoskeletal: Negative for neck pain.  Skin: Negative for rash.  Neurological: Negative for dizziness, light-headedness, numbness and headaches.      Allergies  Review of patient's allergies indicates no known allergies.  Home Medications   Prior to Admission medications   Medication Sig Start Date End Date Taking? Authorizing Provider  acetaminophen (TYLENOL) 500 MG tablet Take 1,000 mg by mouth daily as needed for mild pain or moderate pain.   Yes Historical Provider, MD  calcium citrate (CALCITRATE - DOSED IN MG ELEMENTAL CALCIUM) 950 MG tablet Take 1 tablet (200 mg of elemental calcium total) by mouth 2 (two) times daily. 09/06/14  Yes Josalyn C Funches, MD  diltiazem (TIAZAC) 240 MG 24 hr capsule Take 2 capsules (480 mg total) by mouth daily. 09/13/14  Yes Josalyn C Funches, MD  fluticasone (FLONASE) 50 MCG/ACT nasal spray Place 2 sprays into both nostrils daily. Patient taking differently: Place 2 sprays into both nostrils daily as needed for allergies or rhinitis.  08/26/14  Yes Josalyn C Funches, MD  lisinopril (PRINIVIL,ZESTRIL) 20 MG tablet Take 1 tablet (20 mg total) by mouth daily. 09/06/14  Yes Josalyn C Funches, MD  omeprazole (PRILOSEC) 20 MG capsule Take 1 capsule (20 mg total) by  mouth daily. 09/15/14   Shaune Pollack, MD   BP 166/83 mmHg  Pulse 110  Temp(Src) 98.4 F (36.9 C) (Oral)  Resp 18  SpO2 95% Physical Exam  Constitutional: He is oriented to person, place, and time. He appears well-developed and well-nourished. No distress.  HENT:  Head: Normocephalic and atraumatic.  Mouth/Throat: No oropharyngeal exudate.  Eyes: Conjunctivae are normal. Pupils are equal, round, and  reactive to light.  Neck: Neck supple.  Cardiovascular: Normal rate, normal heart sounds and intact distal pulses.  Exam reveals no friction rub.   No murmur heard. Pulmonary/Chest: Effort normal and breath sounds normal. No respiratory distress. He has no wheezes. He has no rales.  Abdominal: Soft. He exhibits no distension. There is no tenderness. There is no rebound and no guarding.  Musculoskeletal: He exhibits no edema.  Mild diffuse TTP over scapula with no pinpoint tenderness, deformity, bruising, or palpable abnormality. Shoulder with full painless ROM.  Neurological: He is alert and oriented to person, place, and time. No cranial nerve deficit. He exhibits normal muscle tone.  Skin: Skin is warm. No rash noted.  Nursing note and vitals reviewed.   ED Course  Procedures (including critical care time) Labs Review Labs Reviewed  CBC WITH DIFFERENTIAL/PLATELET - Abnormal; Notable for the following:    WBC 11.1 (*)    Neutro Abs 7.8 (*)    All other components within normal limits  COMPREHENSIVE METABOLIC PANEL - Abnormal; Notable for the following:    Glucose, Bld 103 (*)    Calcium 7.8 (*)    AST 41 (*)    ALT 56 (*)    All other components within normal limits  LIPASE, BLOOD  URINALYSIS, ROUTINE W REFLEX MICROSCOPIC  I-STAT TROPOININ, ED    Imaging Review Dg Chest 2 View  09/15/2014   CLINICAL DATA:  Elevated liver function test. Shortness of breath. Patient diagnosed with pneumonia 3 weeks ago.  EXAM: CHEST  2 VIEW  COMPARISON:  PA and lateral chest 08/21/2014 and 08/12/2014.  FINDINGS: Mild peribronchial thickening is identified. No consolidative process, pneumothorax or effusion is seen. Linear atelectasis or scar is noted in the right lower lobe. There is no pneumothorax or pleural effusion.  IMPRESSION: Mild peribronchial thickening suggestive of bronchitis.  Linear atelectasis or scar right lower lobe.   Electronically Signed   By: Drusilla Kanner M.D.   On: 09/15/2014  17:21   US Abdomen Limited Ruq  09/15/2014   CLINICAL DATA:  Elevated liver function tests.  Initial encounter.  EXAM: US ABDOMEN LIMITED - RIGHT UPPER QUADRANT  COMPARISON:  None.  FINDINGS: Gallbladder:  No gallstones or wall thickening visualized. No sonographic Murphy sign noted.  Common bile duct:  Diameter: 4 mm common normal.  Liver:  Echogenic liver when compared to the adjacent RIGHT kidney. No mass lesion or intrahepatic biliary ductal dilation.  IMPRESSION: 1. Negative for cholelithiasis or cholecystitis. 2. Echogenic liver compatible with hepatosteatosis.   Electronically Signed   By: Andreas Newport M.D.   On: 09/15/2014 17:18     EKG Interpretation   Date/Time:  Sunday September 15 2014 13:10:01 EST Ventricular Rate:  103 PR Interval:  160 QRS Duration: 90 QT Interval:  354 QTC Calculation: 463 R Axis:   38 Text Interpretation:  Sinus tachycardia Cannot rule out Anterior infarct ,  age undetermined Abnormal ECG Confirmed by BEATON  MD, ROBERT ([id number]) on  09/15/2014 3:47:39 PM      MDM   Final diagnoses:  Elevated LFTs  Essential hypertension  Left shoulder pain    34 yo AAM with PMHx of HTN, obesity, and tachycardia on Diltiazem who presents with two primary complaints: bloating and mild left shoulder pain. See HPI above. On arrival, pt AF, HR 90-100, RR 19, BP 152/91, satting 95% on RA. Exam as above, pt is overall well-appearing and in NAD. Abdomen soft, NT, ND. See remainder above.  Regarding pt's abdominal pain, suspect this is 2/2 mild GERD versus diet-related bloating. Relief with burping and flatulence is c/w indigestion and he denies any severe pain, vomiting, or signs of pancreatitis. No RUQ TTP or evidence of cholecystitis on exam. Abdomen is o/w soft, NT, ND, without evidence of SBO. No peritonitis and pt HDS. No RLQ TTP or evidence of appendicitis. No chest pain, SOB, and EKG shows NSR with no acute changes - do not suspect ACS/anginal equivalent. Also pain and  exam is not c/w acute dissection. Pt does have HR 90-100, but this is his baseline per review of records (For which PCP has started him on dilt) and he denies any palpitations to suggest arrhythmia. Will f/u labs and re-assess.  Regarding shoulder pain, his history and exam is c/w mild MSK pain 2/2 sleeping position on sofa. No dislocation on exam and ROM is full and painless. No recent trauma and do not suspect underlying fx or bony abnormality. Pt is neurovascularly intact. Will advise supportive care, OTC analgesics, and discussed sleep hygiene. Shoulder pain is separate from reported bloating and is "not connected," do not suspect anginal equivalent.  Labs as above. CBC with mild leukocytosis of 11.1k, which is baseline per review of records. UA negative w/o evidence of stone or UTI. CMP with mild elevation in ASt 41, ALT 56. Bili WNL. This is likely 2/2 fatty liver disease given habitus, but will send for RUQ U/S for eval of GB disease or liver pathology. Lipase normal. Troponin negative. CXR clear.  RUQ U/S negative. Pt states sx resolved. Will d/c with outp f/u, supportive care, and return precautions. Will trial PPI for possible GERD component.  Clinical Impression: 1. Essential hypertension   2. Elevated LFTs   3. Left shoulder pain     Disposition: Discharge  Condition: Good  I have discussed the results, Dx and Tx plan with the pt(& family if present). He/she/they expressed understanding and agree(s) with the plan. Discharge instructions discussed at great length. Strict return precautions discussed and pt &/or family have verbalized understanding of the instructions. No further questions at time of discharge.    Discharge Medication List as of 09/15/2014  6:35 PM    START taking these medications   Details  omeprazole (PRILOSEC) 20 MG capsule Take 1 capsule (20 mg total) by mouth daily., Starting 09/15/2014, Until Discontinued, Print        Follow Up: [facility]  [facility] [address] [place] [id number] [phone]  Call if you are interested in setting up an appointment with a new PCP   Pt seen in conjunction with Dr. Tivis RingerBeaton    Frazer [place], MD 09/16/14 1334  Shaune Pollackameron Taraya Steward, MD 09/16/14 1334  Nelia Shiobert L Beaton, MD 09/17/14 1245

## 2014-09-15 NOTE — ED Notes (Addendum)
Pt presents to department for evaluation of L shoulder pain, abdominal pain and heartburn. Pt states "my stomach feels full." denies pain upon arrival to ED. Respirations unlabored. No nausea/vomiting/diarrhea. Pt is alert and oriented x4. Currently taking Cardizem and Lisinopril.

## 2014-09-16 ENCOUNTER — Other Ambulatory Visit: Payer: Self-pay | Admitting: Family Medicine

## 2014-09-16 DIAGNOSIS — E201 Pseudohypoparathyroidism: Secondary | ICD-10-CM

## 2014-09-16 NOTE — Assessment & Plan Note (Signed)
Elevated PTH level. Plan to check vit D and phos level

## 2014-09-19 ENCOUNTER — Telehealth: Payer: Self-pay | Admitting: *Deleted

## 2014-09-19 NOTE — Telephone Encounter (Signed)
Pt did pick up BP medication and taking daily as prescribed.  Has F/U appointement with PCP      ----- Message from Lora PaulaJosalyn C Funches, MD sent at 09/16/2014 6:25 PM EST -----   Please call patient to verify that he was able to get his BP medicine and that he has f/u labs ordered vit D and phosphorous.

## 2014-09-26 ENCOUNTER — Ambulatory Visit: Payer: BC Managed Care – PPO | Attending: Family Medicine | Admitting: Family Medicine

## 2014-09-26 ENCOUNTER — Encounter: Payer: Self-pay | Admitting: Family Medicine

## 2014-09-26 VITALS — BP 165/110 | HR 106 | Temp 98.7°F | Resp 18 | Ht 69.0 in | Wt 342.0 lb

## 2014-09-26 DIAGNOSIS — E201 Pseudohypoparathyroidism: Secondary | ICD-10-CM | POA: Insufficient documentation

## 2014-09-26 DIAGNOSIS — I10 Essential (primary) hypertension: Secondary | ICD-10-CM | POA: Insufficient documentation

## 2014-09-26 DIAGNOSIS — Z6841 Body Mass Index (BMI) 40.0 and over, adult: Secondary | ICD-10-CM | POA: Insufficient documentation

## 2014-09-26 DIAGNOSIS — J209 Acute bronchitis, unspecified: Secondary | ICD-10-CM | POA: Insufficient documentation

## 2014-09-26 DIAGNOSIS — R030 Elevated blood-pressure reading, without diagnosis of hypertension: Secondary | ICD-10-CM

## 2014-09-26 DIAGNOSIS — IMO0001 Reserved for inherently not codable concepts without codable children: Secondary | ICD-10-CM

## 2014-09-26 LAB — BASIC METABOLIC PANEL
BUN: 11 mg/dL (ref 6–23)
CO2: 25 meq/L (ref 19–32)
Calcium: 8.1 mg/dL — ABNORMAL LOW (ref 8.4–10.5)
Chloride: 100 mEq/L (ref 96–112)
Creat: 0.95 mg/dL (ref 0.50–1.35)
Glucose, Bld: 97 mg/dL (ref 70–99)
Potassium: 3.8 mEq/L (ref 3.5–5.3)
SODIUM: 139 meq/L (ref 135–145)

## 2014-09-26 LAB — PHOSPHORUS: Phosphorus: 4.4 mg/dL (ref 2.3–4.6)

## 2014-09-26 MED ORDER — CLONIDINE HCL 0.1 MG PO TABS
0.1000 mg | ORAL_TABLET | Freq: Once | ORAL | Status: AC
  Administered 2014-09-26: 0.1 mg via ORAL

## 2014-09-26 MED ORDER — LISINOPRIL 40 MG PO TABS: 40.0000 mg | ORAL_TABLET | Freq: Every day | ORAL | Status: DC

## 2014-09-26 MED ORDER — FEXOFENADINE HCL 60 MG PO TABS: 60.0000 mg | ORAL_TABLET | Freq: Two times a day (BID) | ORAL | Status: DC

## 2014-09-26 MED ORDER — ALBUTEROL SULFATE HFA 108 (90 BASE) MCG/ACT IN AERS: 2.0000 | INHALATION_SPRAY | Freq: Four times a day (QID) | RESPIRATORY_TRACT | Status: DC | PRN

## 2014-09-26 NOTE — Progress Notes (Signed)
Pt comes in for f/u HTN with medication management States he is compliant with taking medications daily Denies headache,dizziness or numb/tingling  BP elevated 171/101 101, will recheck Noted with sob,cough,congestion- not taking OTC medication

## 2014-09-26 NOTE — Patient Instructions (Addendum)
Mr. Yetta BarreJones,  Thank you for coming in today.   1. HTN: Continue diltiazem Increase lisinopril to 40 mg daily.   2. Bronchitis:  Add albuterol and allegra as needed  Consider dry cough could be due to lisinopril so if you are still coughing in 2-3 weeks we will change from lisinopril to ARB  3. Hypoparathyroidism: Vit D and phos today Continue calcium supplement  F/u in 3 weeks with nurse for BP check  See me in 2 months for HTN  Dr. Armen PickupFunches

## 2014-09-27 LAB — VITAMIN D 25 HYDROXY (VIT D DEFICIENCY, FRACTURES): Vit D, 25-Hydroxy: 36 ng/mL (ref 30–100)

## 2014-09-27 NOTE — Assessment & Plan Note (Signed)
HTN: improved  P:  Continue diltiazem Increase lisinopril to 40 mg daily.

## 2014-09-27 NOTE — Assessment & Plan Note (Signed)
Acute Bronchitis:  Add albuterol and allegra as needed  Consider dry cough could be due to lisinopril so if you are still coughing in 2-3 weeks we will change from lisinopril to ARB

## 2014-09-27 NOTE — Assessment & Plan Note (Signed)
Hypoparathyroidism: Vit D and phos today Continue calcium supplement  Vit D and phos normal

## 2014-09-27 NOTE — Progress Notes (Signed)
   Subjective:    Patient ID: Peter Hahn, male    DOB: 02/16/1981, 34 y.o.   MRN: [id number] CC: f./u HTN HPI 34 yo M:  1. CHRONIC HYPERTENSION  Disease Monitoring  Blood pressure range: does not check   Chest pain: no   Dyspnea: no   Claudication: no   Medication compliance: yes  Medication Side Effects  Lightheadedness: no   Urinary frequency: no   Edema: no   Impotence: no   2. Cough: recent bronchitis. Has dry cough. Withy congestion. No fever, CP or SOB. Was recently treated with azithromycin.  3. psuedohypoparathyroidism: taking calcium supplement. No bone pain, dysuria or GI upset.   Soc Hx: non smoker  Review of Systems As per HPI     Objective:   Physical Exam BP 165/110 mmHg  Pulse 106  Temp(Src) 98.7 F (37.1 C) (Oral)  Resp 18  Ht 5\' 9"  (1.753 m)  Wt 342 lb (155.13 kg)  BMI 50.48 kg/m2  SpO2 96% General appearance: alert, cooperative, no distress and morbidly obese Lungs: wheezes RUL Heart: regular rate and rhythm, S1, S2 normal, no murmur, click, rub or gallop   Albuterol neb treatment x one:  Improved lung exam following albuterol        Assessment & Plan:

## 2014-09-28 ENCOUNTER — Emergency Department (HOSPITAL_COMMUNITY)
Admission: EM | Admit: 2014-09-28 | Discharge: 2014-09-28 | Disposition: A | Discharge status: Home / Self Care | Payer: BC Managed Care – PPO | Attending: Emergency Medicine | Admitting: Emergency Medicine

## 2014-09-28 ENCOUNTER — Encounter (HOSPITAL_COMMUNITY): Payer: Self-pay | Admitting: *Deleted

## 2014-09-28 ENCOUNTER — Emergency Department (HOSPITAL_COMMUNITY): Payer: BC Managed Care – PPO

## 2014-09-28 DIAGNOSIS — J159 Unspecified bacterial pneumonia: Secondary | ICD-10-CM | POA: Insufficient documentation

## 2014-09-28 DIAGNOSIS — Z79899 Other long term (current) drug therapy: Secondary | ICD-10-CM | POA: Insufficient documentation

## 2014-09-28 DIAGNOSIS — E669 Obesity, unspecified: Secondary | ICD-10-CM | POA: Insufficient documentation

## 2014-09-28 DIAGNOSIS — J189 Pneumonia, unspecified organism: Secondary | ICD-10-CM

## 2014-09-28 DIAGNOSIS — I10 Essential (primary) hypertension: Secondary | ICD-10-CM | POA: Insufficient documentation

## 2014-09-28 DIAGNOSIS — R079 Chest pain, unspecified: Secondary | ICD-10-CM | POA: Diagnosis present

## 2014-09-28 HISTORY — DX: Obesity, unspecified: E66.9

## 2014-09-28 LAB — BASIC METABOLIC PANEL WITH GFR
Anion gap: 7 (ref 5–15)
BUN: 8 mg/dL (ref 6–23)
CO2: 27 mmol/L (ref 19–32)
Calcium: 7.3 mg/dL — ABNORMAL LOW (ref 8.4–10.5)
Chloride: 104 mmol/L (ref 96–112)
Creatinine, Ser: 0.96 mg/dL (ref 0.50–1.35)
GFR calc Af Amer: >90 mL/min
GFR calc non Af Amer: >90 mL/min
Glucose, Bld: 93 mg/dL (ref 70–99)
Potassium: 3.7 mmol/L (ref 3.5–5.1)
Sodium: 138 mmol/L (ref 135–145)

## 2014-09-28 LAB — CBC WITH DIFFERENTIAL/PLATELET
Basophils Absolute: 0 K/uL (ref 0.0–0.1)
Basophils Relative: 0 % (ref 0–1)
Eosinophils Absolute: 0.2 K/uL (ref 0.0–0.7)
Eosinophils Relative: 2 % (ref 0–5)
HCT: 40.8 % (ref 39.0–52.0)
Hemoglobin: 13.3 g/dL (ref 13.0–17.0)
Lymphocytes Relative: 23 % (ref 12–46)
Lymphs Abs: 1.7 K/uL (ref 0.7–4.0)
MCH: 29.2 pg (ref 26.0–34.0)
MCHC: 32.6 g/dL (ref 30.0–36.0)
MCV: 89.7 fL (ref 78.0–100.0)
Monocytes Absolute: 0.5 K/uL (ref 0.1–1.0)
Monocytes Relative: 6 % (ref 3–12)
Neutro Abs: 4.9 K/uL (ref 1.7–7.7)
Neutrophils Relative %: 69 % (ref 43–77)
Platelets: 225 K/uL (ref 150–400)
RBC: 4.55 MIL/uL (ref 4.22–5.81)
RDW: 15.4 % (ref 11.5–15.5)
WBC: 7.2 K/uL (ref 4.0–10.5)

## 2014-09-28 LAB — I-STAT TROPONIN, ED: Troponin i, poc: 0 ng/mL (ref 0.00–0.08)

## 2014-09-28 MED ORDER — IPRATROPIUM-ALBUTEROL 0.5-2.5 (3) MG/3ML IN SOLN
3.0000 mL | Freq: Once | RESPIRATORY_TRACT | Status: AC
  Administered 2014-09-28: 3 mL via RESPIRATORY_TRACT
  Filled 2014-09-28: qty 3

## 2014-09-28 MED ORDER — GUAIFENESIN 100 MG/5ML PO SOLN: 5.0000 mL | ORAL | Status: DC | PRN

## 2014-09-28 MED ORDER — ALBUTEROL (5 MG/ML) CONTINUOUS INHALATION SOLN
10.0000 mg/h | INHALATION_SOLUTION | RESPIRATORY_TRACT | Status: DC
  Administered 2014-09-28: 10 mg/h via RESPIRATORY_TRACT
  Filled 2014-09-28: qty 20

## 2014-09-28 MED ORDER — LEVOFLOXACIN 750 MG PO TABS: 750.0000 mg | ORAL_TABLET | Freq: Every day | ORAL | Status: DC

## 2014-09-28 NOTE — Discharge Instructions (Signed)

## 2014-09-28 NOTE — ED Notes (Signed)
Pt reports having a cough and chest congestion/"tightness" for several days. No acute distress noted at triage.

## 2014-09-28 NOTE — ED Provider Notes (Signed)
CSN: [id number]     Arrival date & time 09/28/14  1033 History   First MD Initiated Contact with Patient 09/28/14 1111     Chief Complaint  Patient presents with  . Chest Pain  . Cough   Peter Hahn is a 34 y.o. male with a history of hypertension, obesity and pneumonia who presents to the emergency department complaining of a cough with wheezing and nasal congestion for the past 6 days. The patient complains of a productive cough associated with wheezing, nasal congestion, and chest tightness. Patient ports he woke up this morning with chest tightness and feeling lightheaded after using his albuterol inhaler. Patient reports this lightheadedness has resolved currently. He still claims of some chest tightness and wheezing. Patient ports he was seen by his primary care provider 2 days ago and was provided albuterol inhaler for bronchitis. Patient reports a history of pneumonia last month. Currently denies any chest pain or shortness of breath. Patient did not get a flu shot this year. He denies any body aches. Patient denies fevers, chills, chest pain shortness breath, palpitations, abdominal pain, nausea, vomiting, rashes, headache, leg pain, leg swelling, or weakness.   (Consider location/radiation/quality/duration/timing/severity/associated sxs/prior Treatment) HPI  Past Medical History  Diagnosis Date  . Pseudohypoparathyroidism   . Hypertension 2006    first told his BP was high, never treated   . Obesity    History reviewed. No pertinent past surgical history. Family History  Problem Relation Age of Onset  . Diabetes Mother   . Diabetes Father   . Hypertension Other    History  Substance Use Topics  . Smoking status: Never Smoker   . Smokeless tobacco: Not on file  . Alcohol Use: No    Review of Systems  Constitutional: Negative for fever, chills and appetite change.  HENT: Positive for congestion, postnasal drip and rhinorrhea. Negative for ear pain, sinus pressure, sore  throat and trouble swallowing.   Eyes: Negative for pain, itching and visual disturbance.  Respiratory: Positive for cough, chest tightness and wheezing. Negative for shortness of breath.   Cardiovascular: Negative for chest pain, palpitations and leg swelling.  Gastrointestinal: Negative for nausea, vomiting, abdominal pain and diarrhea.  Genitourinary: Negative for dysuria.  Musculoskeletal: Negative for myalgias, back pain, arthralgias, neck pain and neck stiffness.  Skin: Negative for rash.  Neurological: Positive for light-headedness. Negative for dizziness, syncope, weakness, numbness and headaches.      Allergies  Review of patient's allergies indicates no known allergies.  Home Medications   Prior to Admission medications   Medication Sig Start Date End Date Taking? Authorizing Provider  acetaminophen (TYLENOL) 500 MG tablet Take 1,000 mg by mouth daily as needed for mild pain or moderate pain.   Yes Historical Provider, MD  albuterol (PROVENTIL HFA;VENTOLIN HFA) 108 (90 BASE) MCG/ACT inhaler Inhale 2 puffs into the lungs every 6 (six) hours as needed for wheezing or shortness of breath. 09/26/14  Yes Josalyn C Funches, MD  calcium citrate (CALCITRATE - DOSED IN MG ELEMENTAL CALCIUM) 950 MG tablet Take 1 tablet (200 mg of elemental calcium total) by mouth 2 (two) times daily. 09/06/14  Yes Lora Paula, MD  Dextromethorphan-Guaifenesin 10-200 MG CAPS Take 2 capsules by mouth daily as needed (cough, congestion).   Yes Historical Provider, MD  diltiazem (TIAZAC) 240 MG 24 hr capsule Take 2 capsules (480 mg total) by mouth daily. 09/13/14  Yes Josalyn C Funches, MD  fexofenadine (ALLEGRA ALLERGY) 60 MG tablet Take 1 tablet (60 mg  total) by mouth 2 (two) times daily. 09/26/14  Yes Josalyn C Funches, MD  lisinopril (PRINIVIL,ZESTRIL) 40 MG tablet Take 1 tablet (40 mg total) by mouth daily. 09/26/14  Yes Josalyn C Funches, MD  guaiFENesin (ROBITUSSIN) 100 MG/5ML SOLN Take 5 mLs (100 mg  total) by mouth every 4 (four) hours as needed for cough or to loosen phlegm. 09/28/14   Einar GipWilliam Duncan Aurie Harroun, PA-C  levofloxacin (LEVAQUIN) 750 MG tablet Take 1 tablet (750 mg total) by mouth daily. 09/28/14   Einar GipWilliam Duncan Neils [place], PA-C   BP 134/70 mmHg  Pulse 108  Temp(Src) 99.9 F (37.7 C) (Oral)  Resp 15  SpO2 96% Physical Exam  Constitutional: He appears well-developed and well-nourished. No distress.  HENT:  Head: Normocephalic and atraumatic.  Right Ear: External ear normal.  Left Ear: External ear normal.  Mouth/Throat: Oropharynx is clear and moist. No oropharyngeal exudate.  Bilateral tympanic membranes are pearly-gray without erythema or loss of landmarks. Rhinorrhea noted. No maxillary or frontal sinus tenderness. No tonsillar hypertrophy or exudates.  Eyes: Conjunctivae are normal. Pupils are equal, round, and reactive to light. Right eye exhibits no discharge. Left eye exhibits no discharge.  Neck: Normal range of motion. Neck supple. No JVD present.  Cardiovascular: Normal rate, regular rhythm, normal heart sounds and intact distal pulses.  Exam reveals no gallop and no friction rub.   No murmur heard. HR 96.   Pulmonary/Chest: Effort normal. No respiratory distress. He has wheezes. He has no rales. He exhibits no tenderness.  Respirations normal and unlabored. Expiratory wheezing scattered bilaterally. Diminished lung sounds, worse on right than left.   Abdominal: Soft. Bowel sounds are normal. He exhibits no distension. There is no tenderness.  Musculoskeletal: He exhibits no edema or tenderness.  No lower extremity edema or tenderness.  Lymphadenopathy:    He has no cervical adenopathy.  Neurological: He is alert. Coordination normal.  Skin: Skin is warm and dry. No rash noted. He is not diaphoretic. No erythema. No pallor.  Psychiatric: He has a normal mood and affect. His behavior is normal.  Nursing note and vitals reviewed.   ED Course  Procedures (including  critical care time) Labs Review Labs Reviewed  BASIC METABOLIC PANEL - Abnormal; Notable for the following:    Calcium 7.3 (*)    All other components within normal limits  CBC WITH DIFFERENTIAL/PLATELET  Rosezena SensorI-STAT TROPOININ, ED    Imaging Review Dg Chest 2 View  09/28/2014   CLINICAL DATA:  Congestion, productive cough, chest tightness  EXAM: CHEST  2 VIEW  COMPARISON:  09/15/2014  FINDINGS: Right upper and lower lobe opacities, suspicious for pneumonia.  No pleural effusion or pneumothorax.  The heart is normal in size.  Visualized osseous structures are within normal limits.  IMPRESSION: Right upper and lower lobe pneumonia.   Electronically Signed   By: Charline BillsSriyesh  Krishnan M.D.   On: 09/28/2014 13:32     EKG Interpretation   Date/Time:  Saturday September 28 2014 10:35:18 EST Ventricular Rate:  98 PR Interval:  156 QRS Duration: 86 QT Interval:  360 QTC Calculation: 459 R Axis:   42 Text Interpretation:  Normal sinus rhythm Normal ECG Confirmed by RAY MD,  DANIELLE ([id number]) on 09/28/2014 4:05:34 PM      Filed Vitals:   09/28/14 1745 09/28/14 1815 09/28/14 1825 09/28/14 1826  BP: 130/67 134/70    Pulse: 111 122  108  Temp:   99.9 F (37.7 C)   TempSrc:   Oral  Resp:    15  SpO2: 97% 97%  96%     MDM   Meds given in ED:  Medications  ipratropium-albuterol (DUONEB) 0.5-2.5 (3) MG/3ML nebulizer solution 3 mL (3 mLs Nebulization Given 09/28/14 1213)    Discharge Medication List as of 09/28/2014  5:23 PM    START taking these medications   Details  guaiFENesin (ROBITUSSIN) 100 MG/5ML SOLN Take 5 mLs (100 mg total) by mouth every 4 (four) hours as needed for cough or to loosen phlegm., Starting 09/28/2014, Until Discontinued, Print    levofloxacin (LEVAQUIN) 750 MG tablet Take 1 tablet (750 mg total) by mouth daily., Starting 09/28/2014, Until Discontinued, Print        Final diagnoses:  Community acquired pneumonia  Hypocalcemia   This is a 34 y.o. male with a  history of hypertension, obesity and pneumonia who presents to the emergency department complaining of a cough with wheezing and nasal congestion for the past 6 days. The patient complains of a productive cough associated with wheezing, nasal congestion, and chest tightness. The patient was diagnosed with community-acquired pneumonia on 08/12/2014 and treated with azithromycin. He had another CXR on 08/21/14 which showed this pneumonia had resolved. The patient is afebrile and nontoxic-appearing. His oxygen saturation is 96% on room air. Patient has scattered wheezing bilaterally and diminished lung sounds greater on the right and the left. Plan is to to chest x-ray, nebulizer treatment and basic blood work.  At 13:45 reevaluation the patient still has wheezing and diminished lung sounds, worse on right than left. Will do continuous neb and reevaluate. His oxygen saturation is 96% on room air and his not tachypneic or tachycardic.   Patient's chest x-ray indicates right upper and lower lobe pneumonia. CBC is unremarkable. Troponin is negative. At reevaluation the patient reports feeling much better and feels ready to be discharged. Plan is to discharge with Levaquin and Robitussin. I instructed the patient to follow-up with his primary care provider this week. I advised him to return to the emergency room with new or worsening symptoms or new concerns. The patient verbalized understanding and agreement with plan.  Waiting on calcium to return at shift change and patient care handed off at shift change for disposition.   This patient was discussed with Dr. Rosalia Hammers who agrees with assessment and plan.   Lawana Chambers, PA-C 09/29/14 1703  Hilario Quarry, MD 10/01/14 [phone]

## 2014-09-28 NOTE — Progress Notes (Signed)
Pt off CAT and on RA. Pt states he feels like his heart is racing but he can breathe slightly better.

## 2014-09-30 ENCOUNTER — Encounter (HOSPITAL_COMMUNITY): Payer: Self-pay | Admitting: Emergency Medicine

## 2014-09-30 ENCOUNTER — Emergency Department (HOSPITAL_COMMUNITY)
Admission: EM | Admit: 2014-09-30 | Discharge: 2014-09-30 | Disposition: A | Discharge status: Home / Self Care | Payer: BC Managed Care – PPO | Attending: Emergency Medicine | Admitting: Emergency Medicine

## 2014-09-30 DIAGNOSIS — I10 Essential (primary) hypertension: Secondary | ICD-10-CM | POA: Diagnosis not present

## 2014-09-30 DIAGNOSIS — Z8701 Personal history of pneumonia (recurrent): Secondary | ICD-10-CM | POA: Diagnosis not present

## 2014-09-30 DIAGNOSIS — R0789 Other chest pain: Secondary | ICD-10-CM | POA: Insufficient documentation

## 2014-09-30 DIAGNOSIS — E201 Pseudohypoparathyroidism: Secondary | ICD-10-CM | POA: Insufficient documentation

## 2014-09-30 DIAGNOSIS — E669 Obesity, unspecified: Secondary | ICD-10-CM | POA: Diagnosis not present

## 2014-09-30 DIAGNOSIS — J209 Acute bronchitis, unspecified: Secondary | ICD-10-CM | POA: Diagnosis not present

## 2014-09-30 DIAGNOSIS — Z79899 Other long term (current) drug therapy: Secondary | ICD-10-CM | POA: Insufficient documentation

## 2014-09-30 DIAGNOSIS — R0602 Shortness of breath: Secondary | ICD-10-CM | POA: Diagnosis present

## 2014-09-30 LAB — I-STAT TROPONIN, ED: Troponin i, poc: 0 ng/mL (ref 0.00–0.08)

## 2014-09-30 MED ORDER — IPRATROPIUM-ALBUTEROL 0.5-2.5 (3) MG/3ML IN SOLN
RESPIRATORY_TRACT | Status: AC
  Filled 2014-09-30: qty 3

## 2014-09-30 MED ORDER — ALBUTEROL SULFATE HFA 108 (90 BASE) MCG/ACT IN AERS
2.0000 | INHALATION_SPRAY | Freq: Once | RESPIRATORY_TRACT | Status: AC
  Administered 2014-09-30: 2 via RESPIRATORY_TRACT

## 2014-09-30 MED ORDER — PREDNISONE 20 MG PO TABS
60.0000 mg | ORAL_TABLET | Freq: Once | ORAL | Status: AC
  Administered 2014-09-30: 60 mg via ORAL
  Filled 2014-09-30: qty 3

## 2014-09-30 MED ORDER — ALBUTEROL SULFATE HFA 108 (90 BASE) MCG/ACT IN AERS
2.0000 | INHALATION_SPRAY | Freq: Once | RESPIRATORY_TRACT | Status: DC
  Filled 2014-09-30: qty 6.7

## 2014-09-30 MED ORDER — ALBUTEROL SULFATE (2.5 MG/3ML) 0.083% IN NEBU
5.0000 mg | INHALATION_SOLUTION | Freq: Once | RESPIRATORY_TRACT | Status: AC
  Administered 2014-09-30: 5 mg via RESPIRATORY_TRACT
  Filled 2014-09-30: qty 6

## 2014-09-30 MED ORDER — PREDNISONE 50 MG PO TABS: ORAL_TABLET | ORAL | Status: DC

## 2014-09-30 MED ORDER — IPRATROPIUM-ALBUTEROL 0.5-2.5 (3) MG/3ML IN SOLN
3.0000 mL | Freq: Once | RESPIRATORY_TRACT | Status: AC
  Administered 2014-09-30: 3 mL via RESPIRATORY_TRACT

## 2014-09-30 NOTE — ED Notes (Signed)
PT ambulated 100+ feet while on pulse ox. Steady gait. Pt denies dizziness or lightheadedness. Pt's pulse ox stayed between 92-95% during ambulation. Pt's pulse was between 110-125bpm during ambulation. Pt states he felt "good" during ambulation.  Pt returned to bed. Monitored by pulse ox, bp cuff, and 5-lead. NAD noted.

## 2014-09-30 NOTE — Discharge Instructions (Signed)

## 2014-09-30 NOTE — ED Notes (Signed)
Pt seen Sunday and rdx with pneumonia. Pt taking Levaquin. Pt reports continued sob and chest tightness intermittently. Wheezing noted in lung fields.

## 2014-09-30 NOTE — ED Provider Notes (Signed)
CSN: [id number]     Arrival date & time 09/30/14  1921 History   First MD Initiated Contact with Patient 09/30/14 2225     Chief Complaint  Patient presents with  . Shortness of Breath    Patient is a 34 y.o. male presenting with shortness of breath. The history is provided by the patient.  Shortness of Breath Severity:  Moderate Onset quality:  Gradual Duration: several hours. Timing:  Constant Progression:  Worsening Chronicity:  Recurrent Relieved by:  Nothing Worsened by:  Nothing tried Associated symptoms: cough and wheezing   Associated symptoms: no fever and no hemoptysis   Associated symptoms comment:  Chest tightness  Risk factors: no tobacco use   Patient reports he had onset of cough/wheezing and chest tightness earlier today He reports it is worsening No fevers are reported He was recently diagnosed with pneumonia and he is taking levaquin  Past Medical History  Diagnosis Date  . Pseudohypoparathyroidism   . Hypertension 2006    first told his BP was high, never treated   . Obesity    History reviewed. No pertinent past surgical history. Family History  Problem Relation Age of Onset  . Diabetes Mother   . Diabetes Father   . Hypertension Other    History  Substance Use Topics  . Smoking status: Never Smoker   . Smokeless tobacco: Not on file  . Alcohol Use: No    Review of Systems  Constitutional: Negative for fever.  Respiratory: Positive for cough, shortness of breath and wheezing. Negative for hemoptysis.   Cardiovascular:       Chest tightness   All other systems reviewed and are negative.     Allergies  Review of patient's allergies indicates no known allergies.  Home Medications   Prior to Admission medications   Medication Sig Start Date End Date Taking? Authorizing Provider  acetaminophen (TYLENOL) 500 MG tablet Take 1,000 mg by mouth daily as needed for mild pain or moderate pain.    Historical Provider, MD  albuterol (PROVENTIL  HFA;VENTOLIN HFA) 108 (90 BASE) MCG/ACT inhaler Inhale 2 puffs into the lungs every 6 (six) hours as needed for wheezing or shortness of breath. 09/26/14   Josalyn C Funches, MD  calcium citrate (CALCITRATE - DOSED IN MG ELEMENTAL CALCIUM) 950 MG tablet Take 1 tablet (200 mg of elemental calcium total) by mouth 2 (two) times daily. 09/06/14   Lora PaulaJosalyn C Funches, MD  Dextromethorphan-Guaifenesin 10-200 MG CAPS Take 2 capsules by mouth daily as needed (cough, congestion).    Historical Provider, MD  diltiazem (TIAZAC) 240 MG 24 hr capsule Take 2 capsules (480 mg total) by mouth daily. 09/13/14   Lora PaulaJosalyn C Funches, MD  fexofenadine (ALLEGRA ALLERGY) 60 MG tablet Take 1 tablet (60 mg total) by mouth 2 (two) times daily. 09/26/14   Lora PaulaJosalyn C Funches, MD  guaiFENesin (ROBITUSSIN) 100 MG/5ML SOLN Take 5 mLs (100 mg total) by mouth every 4 (four) hours as needed for cough or to loosen phlegm. 09/28/14   Einar GipWilliam Duncan [place], PA-C  levofloxacin (LEVAQUIN) 750 MG tablet Take 1 tablet (750 mg total) by mouth daily. 09/28/14   Einar GipWilliam Duncan [place], PA-C  lisinopril (PRINIVIL,ZESTRIL) 40 MG tablet Take 1 tablet (40 mg total) by mouth daily. 09/26/14   Josalyn C Funches, MD   BP 126/110 mmHg  Pulse 94  Temp(Src) 98.5 F (36.9 C) (Oral)  Resp 14  Ht 5\' 10"  (1.778 m)  Wt 341 lb (154.677 kg)  BMI 48.93 kg/m2  SpO2 96% Physical Exam CONSTITUTIONAL: Well developed/well nourished HEAD: Normocephalic/atraumatic EYES: EOMI/PERRL ENMT: Mucous membranes moist NECK: supple no meningeal signs, no JVD SPINE/BACK:entire spine nontender CV: S1/S2 noted, no murmurs/rubs/gallops noted LUNGS: wheezing noted bilaterally.  No distress noted ABDOMEN: soft, nontender, no rebound or guarding, bowel sounds noted throughout abdomen GU:no cva tenderness NEURO: Pt is awake/alert/appropriate, moves all extremitiesx4.  No facial droop.   EXTREMITIES: pulses normal/equal, full ROM, no LE edema is noted SKIN: warm, color normal PSYCH:  no abnormalities of mood noted, alert and oriented to situation  ED Course  Procedures   11:16 PM Pt well appearing, no distress He has wheezing but feels improved Recent diagnosed with CAP and is on levaquin I doubt worsening pneumonia will defer CXR (no fever, no hypoxia, no crackles) He denies h/o asthma but has "bronchitis" He has had multiple visits for cough/wheeze recently Will start on prednisone Will refer to pulmonology for multiple episodes of wheezing recently 11:44 PM Pt improved He denies dyspnea on exertion He feels well for d/c home I doubt ACS/PE I do feel he would benefit from outpatient evaluation and potentially pulmonology evaluation  Labs Review Labs Reviewed  Rosezena Sensor, ED     EKG Interpretation   Date/Time:  Monday September 30 2014 19:33:30 EST Ventricular Rate:  96 PR Interval:  164 QRS Duration: 92 QT Interval:  368 QTC Calculation: 464 R Axis:   47 Text Interpretation:  Normal sinus rhythm Normal ECG No significant change  since last tracing Confirmed by Bebe Shaggy  MD, Laniqua Torrens ([id number]) on 09/30/2014  10:17:07 PM     Medications  ipratropium-albuterol (DUONEB) 0.5-2.5 (3) MG/3ML nebulizer solution (  Canceled Entry 09/30/14 2216)  ipratropium-albuterol (DUONEB) 0.5-2.5 (3) MG/3ML nebulizer solution 3 mL (3 mLs Nebulization Given 09/30/14 1947)  predniSONE (DELTASONE) tablet 60 mg (60 mg Oral Given 09/30/14 2215)  albuterol (PROVENTIL) (2.5 MG/3ML) 0.083% nebulizer solution 5 mg (5 mg Nebulization Given 09/30/14 2239)    MDM   Final diagnoses:  Bronchospasm with bronchitis, acute    Nursing notes including past medical history and social history reviewed and considered in documentation Labs/vital reviewed myself and considered during evaluation Previous records reviewed and considered     Joya Gaskins, MD 09/30/14 2355

## 2014-10-01 ENCOUNTER — Encounter: Payer: Self-pay | Admitting: Family Medicine

## 2014-10-01 ENCOUNTER — Ambulatory Visit: Payer: BC Managed Care – PPO | Attending: Family Medicine | Admitting: Family Medicine

## 2014-10-01 VITALS — BP 155/83 | HR 125 | Temp 98.4°F | Resp 16 | Ht 70.0 in | Wt 334.0 lb

## 2014-10-01 DIAGNOSIS — J209 Acute bronchitis, unspecified: Secondary | ICD-10-CM

## 2014-10-01 DIAGNOSIS — J189 Pneumonia, unspecified organism: Secondary | ICD-10-CM

## 2014-10-01 DIAGNOSIS — I10 Essential (primary) hypertension: Secondary | ICD-10-CM

## 2014-10-01 MED ORDER — CLONIDINE HCL 0.1 MG PO TABS
0.1000 mg | ORAL_TABLET | Freq: Once | ORAL | Status: AC
  Administered 2014-10-01: 0.1 mg via ORAL

## 2014-10-01 NOTE — Assessment & Plan Note (Signed)
A: improved. Still elevated Meds: compliant P: close f/u with RN Consider change from lisinopril to thiazide diuretic if cough persist, thiazide would also help with chronic hypocalcemia

## 2014-10-01 NOTE — Assessment & Plan Note (Signed)
Resolved vs progressed to PNA

## 2014-10-01 NOTE — Assessment & Plan Note (Signed)
A: improved clinically. I am concerned that this is his second significant resp infection in < 2 months  P: PPD pulm referral for PFTs Finish levaquin Prn albuterol, this is a short term medication

## 2014-10-01 NOTE — Progress Notes (Signed)
   Subjective:    Patient ID: Peter Hahn, male    DOB: 12/01/1980, 34 y.o.   MRN: [id number] CC: ED f/u R L PNA  HPI 34 yo M with morbid obesity, HTN, hypoparathyroidism   1. R sided PNA: dx in ED on 09/28/14. Patient was symptomatic with chills and SOB. He is feeling much better now on Levaquin, prednisone course and albuterol prn. He had bronchitis in 08/12/14 treated with azithromycin. He is HIV negative. He has no hx of asthma. He does reports excessive dust exposure at his apartment. No known sick contacts.   2. HTN: taking lisinopril and diltiazem. No HA, vision changes, CP or SOB.   Soc Hx:  Chronic non smoker  Review of Systems As per HPI    Objective:   Physical Exam BP 155/83 mmHg  Pulse 125  Temp(Src) 98.4 F (36.9 C)  Resp 16  Ht 5\' 10"  (1.778 m)  Wt 334 lb (151.501 kg)  BMI 47.92 kg/m2  SpO2 96% General appearance: alert, cooperative, no distress and morbidly obese Neck: no adenopathy, supple, symmetrical, trachea midline and thyroid not enlarged, symmetric, no tenderness/mass/nodules Lungs: normal WOB, intermittent exp wheezing that improves when patien breathes through his mouth  Heart: regular rate and rhythm, S1, S2 normal, no murmur, click, rub or gallop  Treated with clonidine 0.1 mg for elevated BP  PPD ordered and placed today     Assessment & Plan:

## 2014-10-01 NOTE — Patient Instructions (Addendum)
Mr. Yetta BarreJones,  Thank you for coming in today.   1. Recurrent pneumonia: Finish levaquin Continue albuterol as needed  PPD placed today to rule out TB exposure return in 48 hrs for read. pulmonolgy referral placed for pulmonary function test    2. HTN: BP elevated today Continue diltiazem and lisinopril.  Clonidine 0.1 mg x one given  F/u in 2 week for RN BP check  See me in 3 months for hypertension   Dr. Armen PickupFunches

## 2014-10-01 NOTE — Progress Notes (Signed)
Patient here as a follow up to ER visit on this past Saturday ( 09/28/14) and yesterday (09/30/14)  Patient states his air condition went out last night and he got very hot in his home and the cough started again.

## 2014-10-02 ENCOUNTER — Emergency Department (HOSPITAL_COMMUNITY)
Admission: EM | Admit: 2014-10-02 | Discharge: 2014-10-02 | Disposition: A | Discharge status: Home / Self Care | Payer: BC Managed Care – PPO | Attending: Emergency Medicine | Admitting: Emergency Medicine

## 2014-10-02 ENCOUNTER — Encounter (HOSPITAL_COMMUNITY): Payer: Self-pay | Admitting: Emergency Medicine

## 2014-10-02 ENCOUNTER — Emergency Department (HOSPITAL_COMMUNITY): Payer: BC Managed Care – PPO

## 2014-10-02 DIAGNOSIS — J159 Unspecified bacterial pneumonia: Secondary | ICD-10-CM | POA: Diagnosis not present

## 2014-10-02 DIAGNOSIS — J189 Pneumonia, unspecified organism: Secondary | ICD-10-CM

## 2014-10-02 DIAGNOSIS — Z7951 Long term (current) use of inhaled steroids: Secondary | ICD-10-CM | POA: Insufficient documentation

## 2014-10-02 DIAGNOSIS — Z7952 Long term (current) use of systemic steroids: Secondary | ICD-10-CM | POA: Insufficient documentation

## 2014-10-02 DIAGNOSIS — I10 Essential (primary) hypertension: Secondary | ICD-10-CM | POA: Diagnosis not present

## 2014-10-02 DIAGNOSIS — E201 Pseudohypoparathyroidism: Secondary | ICD-10-CM | POA: Diagnosis not present

## 2014-10-02 DIAGNOSIS — R079 Chest pain, unspecified: Secondary | ICD-10-CM | POA: Diagnosis present

## 2014-10-02 DIAGNOSIS — R0789 Other chest pain: Secondary | ICD-10-CM | POA: Insufficient documentation

## 2014-10-02 DIAGNOSIS — Z79899 Other long term (current) drug therapy: Secondary | ICD-10-CM | POA: Diagnosis not present

## 2014-10-02 DIAGNOSIS — E669 Obesity, unspecified: Secondary | ICD-10-CM | POA: Insufficient documentation

## 2014-10-02 LAB — CBC WITH DIFFERENTIAL/PLATELET
BASOS ABS: 0 10*3/uL (ref 0.0–0.1)
Basophils Relative: 0 % (ref 0–1)
Eosinophils Absolute: 0 10*3/uL (ref 0.0–0.7)
Eosinophils Relative: 0 % (ref 0–5)
HCT: 40 % (ref 39.0–52.0)
HEMOGLOBIN: 13.1 g/dL (ref 13.0–17.0)
Lymphocytes Relative: 11 % — ABNORMAL LOW (ref 12–46)
Lymphs Abs: 1.6 10*3/uL (ref 0.7–4.0)
MCH: 29.1 pg (ref 26.0–34.0)
MCHC: 32.8 g/dL (ref 30.0–36.0)
MCV: 88.9 fL (ref 78.0–100.0)
MONOS PCT: 5 % (ref 3–12)
Monocytes Absolute: 0.8 10*3/uL (ref 0.1–1.0)
NEUTROS ABS: 12.2 10*3/uL — AB (ref 1.7–7.7)
NEUTROS PCT: 84 % — AB (ref 43–77)
Platelets: 265 10*3/uL (ref 150–400)
RBC: 4.5 MIL/uL (ref 4.22–5.81)
RDW: 15.6 % — ABNORMAL HIGH (ref 11.5–15.5)
WBC: 14.5 10*3/uL — AB (ref 4.0–10.5)

## 2014-10-02 LAB — BASIC METABOLIC PANEL
Anion gap: 9 (ref 5–15)
BUN: 16 mg/dL (ref 6–23)
CALCIUM: 7.2 mg/dL — AB (ref 8.4–10.5)
CO2: 26 mmol/L (ref 19–32)
CREATININE: 1.28 mg/dL (ref 0.50–1.35)
Chloride: 104 mmol/L (ref 96–112)
GFR calc Af Amer: 83 mL/min — ABNORMAL LOW (ref >90)
GFR calc non Af Amer: 71 mL/min — ABNORMAL LOW (ref >90)
Glucose, Bld: 109 mg/dL — ABNORMAL HIGH (ref 70–99)
Potassium: 4.1 mmol/L (ref 3.5–5.1)
SODIUM: 139 mmol/L (ref 135–145)

## 2014-10-02 LAB — I-STAT TROPONIN, ED: TROPONIN I, POC: 0 ng/mL (ref 0.00–0.08)

## 2014-10-02 LAB — D-DIMER, QUANTITATIVE: D-Dimer, Quant: <0.27 ug/mL-FEU (ref 0.00–0.48)

## 2014-10-02 MED ORDER — IPRATROPIUM-ALBUTEROL 0.5-2.5 (3) MG/3ML IN SOLN
3.0000 mL | Freq: Once | RESPIRATORY_TRACT | Status: AC
  Administered 2014-10-02: 3 mL via RESPIRATORY_TRACT
  Filled 2014-10-02: qty 3

## 2014-10-02 NOTE — ED Provider Notes (Signed)
CSN: [id number]     Arrival date & time 10/02/14  1644 History   First MD Initiated Contact with Patient 10/02/14 1841     Chief Complaint  Patient presents with  . Chest Pain     (Consider location/radiation/quality/duration/timing/severity/associated sxs/prior Treatment) HPI Comments: presented from home with chest tightness and nonproductive cough for the past 4 days. He was diagnosed with pneumonia on February 27 and seen again if it were 29th. He is currently on Levaquin and prednisone. he reports chest tightness that onset earlier today that is now resolved. Cough is nonproductive. No fever. No exertional chest pain. Endorses some congestion and nasal congestion. No history of asthma but does have "bronchitis." Does not smoke. No leg pain or leg swelling. He is scheduled to see pulmonology next week. No fevers, chills, nausea or vomiting. No abdominal pain. States 2 days left of Levaquin and prednisone.  The history is provided by the patient.    Past Medical History  Diagnosis Date  . Pseudohypoparathyroidism   . Hypertension 2006    first told his BP was high, never treated   . Obesity   . Allergy    History reviewed. No pertinent past surgical history. Family History  Problem Relation Age of Onset  . Diabetes Mother   . Diabetes Father   . Hypertension Other    History  Substance Use Topics  . Smoking status: Never Smoker   . Smokeless tobacco: Never Used  . Alcohol Use: No    Review of Systems  Constitutional: Negative for fever, chills, activity change, appetite change and fatigue.  HENT: Negative for congestion and rhinorrhea.   Respiratory: Positive for cough, chest tightness and shortness of breath.   Cardiovascular: Positive for chest pain.  Gastrointestinal: Negative for nausea, vomiting and abdominal pain.  Genitourinary: Negative for dysuria and hematuria.  Musculoskeletal: Negative for myalgias and arthralgias.  Skin: Negative for wound.  Neurological:  Negative for dizziness, weakness and light-headedness.  A complete 10 system review of systems was obtained and all systems are negative except as noted in the HPI and PMH.      Allergies  Review of patient's allergies indicates no known allergies.  Home Medications   Prior to Admission medications   Medication Sig Start Date End Date Taking? Authorizing Provider  acetaminophen (TYLENOL) 500 MG tablet Take 1,000 mg by mouth daily as needed for mild pain or moderate pain.   Yes Historical Provider, MD  albuterol (PROVENTIL HFA;VENTOLIN HFA) 108 (90 BASE) MCG/ACT inhaler Inhale 2 puffs into the lungs every 6 (six) hours as needed for wheezing or shortness of breath. 09/26/14  Yes Josalyn C Funches, MD  calcium citrate (CALCITRATE - DOSED IN MG ELEMENTAL CALCIUM) 950 MG tablet Take 1 tablet (200 mg of elemental calcium total) by mouth 2 (two) times daily. 09/06/14  Yes Josalyn C Funches, MD  diltiazem (TIAZAC) 240 MG 24 hr capsule Take 2 capsules (480 mg total) by mouth daily. 09/13/14  Yes Josalyn C Funches, MD  fexofenadine (ALLEGRA ALLERGY) 60 MG tablet Take 1 tablet (60 mg total) by mouth 2 (two) times daily. 09/26/14  Yes Josalyn C Funches, MD  fluticasone (FLONASE) 50 MCG/ACT nasal spray Place 2 sprays into both nostrils daily.   Yes Historical Provider, MD  guaiFENesin (ROBITUSSIN) 100 MG/5ML SOLN Take 5 mLs (100 mg total) by mouth every 4 (four) hours as needed for cough or to loosen phlegm. 09/28/14  Yes Einar Gip Dansie, PA-C  levofloxacin (LEVAQUIN) 750 MG tablet Take  1 tablet (750 mg total) by mouth daily. 09/28/14  Yes Einar GipWilliam Duncan Dansie, PA-C  lisinopril (PRINIVIL,ZESTRIL) 40 MG tablet Take 1 tablet (40 mg total) by mouth daily. 09/26/14  Yes Lora PaulaJosalyn C Funches, MD  predniSONE (DELTASONE) 50 MG tablet One tablet PO daily for 4 days 09/30/14  Yes Joya Gaskinsonald W Wickline, MD   BP 133/70 mmHg  Pulse 102  Temp(Src) 98.1 F (36.7 C) (Oral)  Resp 20  Ht 5\' 9"  (1.753 m)  Wt 339 lb (153.769  kg)  BMI 50.04 kg/m2  SpO2 95% Physical Exam  Constitutional: He is oriented to person, place, and time. He appears well-developed and well-nourished. No distress.  Begin full sentences  HENT:  Head: Normocephalic and atraumatic.  Mouth/Throat: Oropharynx is clear and moist. No oropharyngeal exudate.  Eyes: Conjunctivae and EOM are normal. Pupils are equal, round, and reactive to light.  Neck: Normal range of motion. Neck supple.  No meningismus.  Cardiovascular: Normal rate, regular rhythm, normal heart sounds and intact distal pulses.   No murmur heard. Pulmonary/Chest: Effort normal. No respiratory distress. He has wheezes.  Scattered respiratory wheezing, no distress  Abdominal: Soft. There is no tenderness. There is no rebound and no guarding.  Musculoskeletal: Normal range of motion. He exhibits no edema or tenderness.  Neurological: He is alert and oriented to person, place, and time. No cranial nerve deficit. He exhibits normal muscle tone. Coordination normal.  No ataxia on finger to nose bilaterally. No pronator drift. 5/5 strength throughout. CN 2-12 intact. Negative Romberg. Equal grip strength. Sensation intact. Gait is normal.   Skin: Skin is warm.  Psychiatric: He has a normal mood and affect. His behavior is normal.  Nursing note and vitals reviewed.   ED Course  Procedures (including critical care time) Labs Review Labs Reviewed  CBC WITH DIFFERENTIAL/PLATELET - Abnormal; Notable for the following:    WBC 14.5 (*)    RDW 15.6 (*)    Neutrophils Relative % 84 (*)    Neutro Abs 12.2 (*)    Lymphocytes Relative 11 (*)    All other components within normal limits  BASIC METABOLIC PANEL - Abnormal; Notable for the following:    Glucose, Bld 109 (*)    Calcium 7.2 (*)    GFR calc non Af Amer 71 (*)    GFR calc Af Amer 83 (*)    All other components within normal limits  D-DIMER, QUANTITATIVE  I-STAT TROPOININ, ED    Imaging Review Dg Chest 2 View  10/02/2014    CLINICAL DATA:  Chest pain and dry cough for 4 days.  EXAM: CHEST  2 VIEW  COMPARISON:  PA and lateral chest 09/28/2014 and 08/12/2014.  FINDINGS: Right upper and lower lobe airspace disease seen on the most recent examination has resolved. The lungs appear clear. No consolidative process, pneumothorax or effusion. Heart size is normal.  IMPRESSION: No acute disease.   Electronically Signed   By: Drusilla Kannerhomas  Dalessio M.D.   On: 10/02/2014 20:02     EKG Interpretation   Date/Time:  Wednesday October 02 2014 16:51:12 EST Ventricular Rate:  101 PR Interval:  156 QRS Duration: 88 QT Interval:  352 QTC Calculation: 456 R Axis:   43 Text Interpretation:  Sinus tachycardia Otherwise normal ECG Rate faster  Confirmed by Linn Clavin  MD, Zaydrian Batta [phone]([id number]) on 10/02/2014 6:41:33 PM      MDM   Final diagnoses:  Atypical chest pain  Community acquired pneumonia   Patient with chest tightness and nonproductive  cough, recent diagnosis of pneumonia. Compliant with Levaquin and steroids.  No wheezing on exam. Patient received nebulizer prior to my evaluation. Chest x-ray appears improved with resolution of infiltrate. D-dimer negative. Patient ambulatory without desaturation.  Doubt ACS or PE. Advised to continue Levaquin and steroids. Follow up with pulmonary as scheduled next week. Return precautions discussed.     Glynn Octave, MD 10/02/14 [phone]

## 2014-10-02 NOTE — Discharge Instructions (Signed)
Chest Pain (Nonspecific) °There is no evidence of heart attack or blood clot in the lung. Follow up with your doctor. Return to the ED if you develop new or worsening symptoms. °It is often hard to give a specific diagnosis for the cause of chest pain. There is always a chance that your pain could be related to something serious, such as a heart attack or a blood clot in the lungs. You need to follow up with your health care provider for further evaluation. °CAUSES  °· Heartburn. °· Pneumonia or bronchitis. °· Anxiety or stress. °· Inflammation around your heart (pericarditis) or lung (pleuritis or pleurisy). °· A blood clot in the lung. °· A collapsed lung (pneumothorax). It can develop suddenly on its own (spontaneous pneumothorax) or from trauma to the chest. °· Shingles infection (herpes zoster virus). °The chest wall is composed of bones, muscles, and cartilage. Any of these can be the source of the pain. °· The bones can be bruised by injury. °· The muscles or cartilage can be strained by coughing or overwork. °· The cartilage can be affected by inflammation and become sore (costochondritis). °DIAGNOSIS  °Lab tests or other studies may be needed to find the cause of your pain. Your health care provider may have you take a test called an ambulatory electrocardiogram (ECG). An ECG records your heartbeat patterns over a 24-hour period. You may also have other tests, such as: °· Transthoracic echocardiogram (TTE). During echocardiography, sound waves are used to evaluate how blood flows through your heart. °· Transesophageal echocardiogram (TEE). °· Cardiac monitoring. This allows your health care provider to monitor your heart rate and rhythm in real time. °· Holter monitor. This is a portable device that records your heartbeat and can help diagnose heart arrhythmias. It allows your health care provider to track your heart activity for several days, if needed. °· Stress tests by exercise or by giving medicine  that makes the heart beat faster. °TREATMENT  °· Treatment depends on what may be causing your chest pain. Treatment may include: °¨ Acid blockers for heartburn. °¨ Anti-inflammatory medicine. °¨ Pain medicine for inflammatory conditions. °¨ Antibiotics if an infection is present. °· You may be advised to change lifestyle habits. This includes stopping smoking and avoiding alcohol, caffeine, and chocolate. °· You may be advised to keep your head raised (elevated) when sleeping. This reduces the chance of acid going backward from your stomach into your esophagus. °Most of the time, nonspecific chest pain will improve within 2-3 days with rest and mild pain medicine.  °HOME CARE INSTRUCTIONS  °· If antibiotics were prescribed, take them as directed. Finish them even if you start to feel better. °· For the next few days, avoid physical activities that bring on chest pain. Continue physical activities as directed. °· Do not use any tobacco products, including cigarettes, chewing tobacco, or electronic cigarettes. °· Avoid drinking alcohol. °· Only take medicine as directed by your health care provider. °· Follow your health care provider's suggestions for further testing if your chest pain does not go away. °· Keep any follow-up appointments you made. If you do not go to an appointment, you could develop lasting (chronic) problems with pain. If there is any problem keeping an appointment, call to reschedule. °SEEK MEDICAL CARE IF:  °· Your chest pain does not go away, even after treatment. °· You have a rash with blisters on your chest. °· You have a fever. °SEEK IMMEDIATE MEDICAL CARE IF:  °· You have increased   chest pain or pain that spreads to your arm, neck, jaw, back, or abdomen. °· You have shortness of breath. °· You have an increasing cough, or you cough up blood. °· You have severe back or abdominal pain. °· You feel nauseous or vomit. °· You have severe weakness. °· You faint. °· You have chills. °This is an  emergency. Do not wait to see if the pain will go away. Get medical help at once. Call your local emergency services (911 in [place] Do not drive yourself to the hospital. °MAKE SURE YOU:  °· Understand these instructions. °· Will watch your condition. °· Will get help right away if you are not doing well or get worse. °Document Released: 04/28/2005 Document Revised: 07/24/2013 Document Reviewed: 02/22/2008 °ExitCare® Patient Information ©2015 [employer]. This information is not intended to replace advice given to you by your health care provider. Make sure you discuss any questions you have with your health care provider. ° °

## 2014-10-02 NOTE — ED Notes (Signed)
Pt st's he was diagnosed with pneumonia on 2/29 was seen here again with chest tightness and dx with bronchitis.  Pt st's he continues to have cough and chest tightness

## 2014-10-03 ENCOUNTER — Emergency Department (HOSPITAL_COMMUNITY)
Admission: EM | Admit: 2014-10-03 | Discharge: 2014-10-03 | Disposition: A | Discharge status: Home / Self Care | Payer: BC Managed Care – PPO | Attending: Emergency Medicine | Admitting: Emergency Medicine

## 2014-10-03 ENCOUNTER — Telehealth: Payer: Self-pay | Admitting: Family Medicine

## 2014-10-03 ENCOUNTER — Telehealth: Payer: Self-pay | Admitting: Emergency Medicine

## 2014-10-03 ENCOUNTER — Ambulatory Visit: Payer: BC Managed Care – PPO | Attending: Family Medicine

## 2014-10-03 ENCOUNTER — Emergency Department (HOSPITAL_COMMUNITY): Payer: BC Managed Care – PPO

## 2014-10-03 ENCOUNTER — Encounter (HOSPITAL_COMMUNITY): Payer: Self-pay | Admitting: *Deleted

## 2014-10-03 DIAGNOSIS — Z79899 Other long term (current) drug therapy: Secondary | ICD-10-CM | POA: Insufficient documentation

## 2014-10-03 DIAGNOSIS — R42 Dizziness and giddiness: Secondary | ICD-10-CM | POA: Insufficient documentation

## 2014-10-03 DIAGNOSIS — Z7951 Long term (current) use of inhaled steroids: Secondary | ICD-10-CM | POA: Diagnosis not present

## 2014-10-03 DIAGNOSIS — R0789 Other chest pain: Secondary | ICD-10-CM | POA: Insufficient documentation

## 2014-10-03 DIAGNOSIS — R109 Unspecified abdominal pain: Secondary | ICD-10-CM | POA: Diagnosis not present

## 2014-10-03 DIAGNOSIS — I10 Essential (primary) hypertension: Secondary | ICD-10-CM | POA: Diagnosis not present

## 2014-10-03 DIAGNOSIS — R05 Cough: Secondary | ICD-10-CM | POA: Diagnosis not present

## 2014-10-03 DIAGNOSIS — E669 Obesity, unspecified: Secondary | ICD-10-CM | POA: Diagnosis not present

## 2014-10-03 DIAGNOSIS — R110 Nausea: Secondary | ICD-10-CM | POA: Insufficient documentation

## 2014-10-03 DIAGNOSIS — Z792 Long term (current) use of antibiotics: Secondary | ICD-10-CM | POA: Diagnosis not present

## 2014-10-03 LAB — I-STAT CHEM 8, ED
BUN: 22 mg/dL (ref 6–23)
CALCIUM ION: 0.79 mmol/L — AB (ref 1.12–1.23)
CHLORIDE: 104 mmol/L (ref 96–112)
Creatinine, Ser: 1.1 mg/dL (ref 0.50–1.35)
GLUCOSE: 95 mg/dL (ref 70–99)
HEMATOCRIT: 45 % (ref 39.0–52.0)
HEMOGLOBIN: 15.3 g/dL (ref 13.0–17.0)
Potassium: 3.8 mmol/L (ref 3.5–5.1)
Sodium: 139 mmol/L (ref 135–145)
TCO2: 20 mmol/L (ref 0–100)

## 2014-10-03 LAB — I-STAT TROPONIN, ED: TROPONIN I, POC: 0 ng/mL (ref 0.00–0.08)

## 2014-10-03 LAB — TB SKIN TEST
Induration: 0 mm
TB Skin Test: NEGATIVE

## 2014-10-03 MED ORDER — AMLODIPINE BESYLATE 10 MG PO TABS: 10.0000 mg | ORAL_TABLET | Freq: Every day | ORAL | Status: DC

## 2014-10-03 MED ORDER — DIPHENHYDRAMINE HCL 25 MG PO CAPS
25.0000 mg | ORAL_CAPSULE | Freq: Once | ORAL | Status: AC
  Administered 2014-10-03: 25 mg via ORAL
  Filled 2014-10-03: qty 1

## 2014-10-03 MED ORDER — LORAZEPAM 1 MG PO TABS
1.0000 mg | ORAL_TABLET | Freq: Once | ORAL | Status: AC
  Administered 2014-10-03: 1 mg via ORAL
  Filled 2014-10-03: qty 1

## 2014-10-03 MED ORDER — METOCLOPRAMIDE HCL 10 MG PO TABS
10.0000 mg | ORAL_TABLET | Freq: Once | ORAL | Status: AC
  Administered 2014-10-03: 10 mg via ORAL
  Filled 2014-10-03: qty 1

## 2014-10-03 NOTE — Telephone Encounter (Signed)
Called patient. Agree with stopping diltiazem Added norvasc 10 mg daily to replace diltiazem.   Will see patient f/u.

## 2014-10-03 NOTE — Telephone Encounter (Signed)
Expand All Collapse All   Pt scheduled appt regarding medication reaction but would like to speak to nurse in the meantime, please f/u with pt.              Pt called in c/o possible side effect with taking Tiazac starting 09/13/14 States sx's started with hoarseness, cough,congestion and now abdominal tightness States he continues to take medication everyday Instructed pt to stop medication until new instructions given per provider

## 2014-10-03 NOTE — ED Notes (Signed)
NAD at this time. Pt is stable and leaving with his mother.   

## 2014-10-03 NOTE — Discharge Instructions (Signed)
Please follow up with your primary care physician in 1-2 days. If you do not have one please call the [facility] number listed above. Please read all discharge instructions and return precautions.  ° ° °Hypertension °Hypertension, commonly called high blood pressure, is when the force of blood pumping through your arteries is too strong. Your arteries are the blood vessels that carry blood from your heart throughout your body. A blood pressure reading consists of a higher number over a lower number, such as 110/72. The higher number (systolic) is the pressure inside your arteries when your heart pumps. The lower number (diastolic) is the pressure inside your arteries when your heart relaxes. Ideally you want your blood pressure below 120/80. °Hypertension forces your heart to work harder to pump blood. Your arteries may become narrow or stiff. Having hypertension puts you at risk for heart disease, stroke, and other problems.  °RISK FACTORS °Some risk factors for high blood pressure are controllable. Others are not.  °Risk factors you cannot control include:  °· Race. You may be at higher risk if you are African American. °· Age. Risk increases with age. °· Gender. Men are at higher risk than women before age 45 years. After age 65, women are at higher risk than men. °Risk factors you can control include: °· Not getting enough exercise or physical activity. °· Being overweight. °· Getting too much fat, sugar, calories, or salt in your diet. °· Drinking too much alcohol. °SIGNS AND SYMPTOMS °Hypertension does not usually cause signs or symptoms. Extremely high blood pressure (hypertensive crisis) may cause headache, anxiety, shortness of breath, and nosebleed. °DIAGNOSIS  °To check if you have hypertension, your health care provider will measure your blood pressure while you are seated, with your arm held at the level of your heart. It should be measured at least twice using the same arm. Certain  conditions can cause a difference in blood pressure between your right and left arms. A blood pressure reading that is higher than normal on one occasion does not mean that you need treatment. If one blood pressure reading is high, ask your health care provider about having it checked again. °TREATMENT  °Treating high blood pressure includes making lifestyle changes and possibly taking medicine. Living a healthy lifestyle can help lower high blood pressure. You may need to change some of your habits. °Lifestyle changes may include: °· Following the DASH diet. This diet is high in fruits, vegetables, and whole grains. It is low in salt, red meat, and added sugars. °· Getting at least 2½ hours of brisk physical activity every week. °· Losing weight if necessary. °· Not smoking. °· Limiting alcoholic beverages. °· Learning ways to reduce stress. ° If lifestyle changes are not enough to get your blood pressure under control, your health care provider may prescribe medicine. You may need to take more than one. Work closely with your health care provider to understand the risks and benefits. °HOME CARE INSTRUCTIONS °· Have your blood pressure rechecked as directed by your health care provider.   °· Take medicines only as directed by your health care provider. Follow the directions carefully. Blood pressure medicines must be taken as prescribed. The medicine does not work as well when you skip doses. Skipping doses also puts you at risk for problems.   °· Do not smoke.   °· Monitor your blood pressure at home as directed by your health care provider.  °SEEK MEDICAL CARE IF:  °· You think you are having a   reaction to medicines taken. °· You have recurrent headaches or feel dizzy. °· You have swelling in your ankles. °· You have trouble with your vision. °SEEK IMMEDIATE MEDICAL CARE IF: °· You develop a severe headache or confusion. °· You have unusual weakness, numbness, or feel faint. °· You have severe chest or abdominal  pain. °· You vomit repeatedly. °· You have trouble breathing. °MAKE SURE YOU:  °· Understand these instructions. °· Will watch your condition. °· Will get help right away if you are not doing well or get worse. °Document Released: 07/19/2005 Document Revised: 12/03/2013 Document Reviewed: 05/11/2013 °ExitCare® Patient Information ©2015 [employer]. This information is not intended to replace advice given to you by your health care provider. Make sure you discuss any questions you have with your health care provider. ° °Establish relationship with primary care doctor as discussed. A resource guide and information on the Affordable Care Act has been provided for your information.  ° ° °RESOURCE GUIDE ° °If you do not have a primary care doctor to follow up with regarding today's visit, please call the [facility] at [phone] to make an appointment. Hours of operation are 10am - 7pm, Monday through Friday, and they have a sliding scale fee.  ° °Insufficient Money for Medicine: °Contact [employer]:  call "211" or [facility] [phone]. ° °No Primary Care Doctor: °- Call [facility]  [phone] - can help you locate a primary care doctor that  accepts your insurance, provides certain services, etc. °- Physician Referral Service- 1-800-533-3463 ° °Agencies that provide inexpensive medical care: °- [facility]  [phone] °- [facility]  [phone] °- [facility]  [phone] °- [facility]  [phone] °- [facility]  [phone] °- [facility]  [phone] ° °Medicaid-accepting [place] Providers: °- [facility]- [address] ° [phone], Mon-Fri 9am-7pm, Sat 9am-1pm °- [facility]- [address], [address] ° [phone] °- [facility]- [address], [address] ° [phone] °- [facility]- [address] [place]  [address] ° [phone] °- [facility]- [address], [address], [phone] ° Only accepts [place] Access Medicaid patients after they have their name  applied to their card ° °Self Pay (no insurance) in [place]: °- Sickle Cell Patients: Dr Eric Dean, [facility] ° [address], [phone] °- [facility]- [address] ° [phone] °      -      [facility]- [address] °      -     [facility]- see information above (Speak to Pam H if you do not have insurance) °      -  [facility]- [address], [phone] °      -  [facility] [place]- [address],  [phone] °      -  [facility]- [address] [place] [address], [phone] °      -  Dr Osei-Bonsu-  [address], [address], [place], [phone] °      -  [facility]- [address], [phone] °      -  [facility]- [address] [place] [address], [phone], also [address], [phone] °      -      [facility]- [address], [phone], 1st & 3rd Saturday   every month, 10am-1pm ° °1) Find a Doctor and Pay Out of Pocket °Although you won't have to find out who is covered by your insurance plan, it is a good idea to ask around and get recommendations. You will then need to call the office and see if the doctor you have chosen will accept you as a new patient and what types of options they offer for patients who are self-pay. Some doctors offer discounts or will set up payment plans for their patients who do not have insurance, but you will need to ask so you aren't surprised when you get to your appointment. ° °2) Contact Your Local Health Department °Not all health departments have doctors that can see patients for sick visits, but many do, so it is worth a call to see if yours does. If you don't know where your local health department is, you can check in your phone book. The CDC also has a tool to help you locate your state's  health department, and many state websites also have listings of all of their local health departments. ° °3) Find a Walk-in Clinic °If your illness is not likely to be very severe or complicated, you may want to try a walk in clinic. These are popping up all over the country in pharmacies, drugstores, and shopping centers. They're usually staffed by nurse practitioners or physician assistants that have been trained to treat common illnesses and complaints. They're usually fairly quick and inexpensive. However, if you have serious medical issues or chronic medical problems, these are probably not your best option ° ° °

## 2014-10-03 NOTE — Telephone Encounter (Signed)
Patient is stating that he has had bloating/constipation and congestion and had to go to the hospital and is thinking that it might be due to diltiazem (TIAZAC) 240 MG 24 hr capsule. Please follow up with pt.

## 2014-10-03 NOTE — ED Notes (Signed)
Pt in stating that he is concerned about his blood pressure, states he thinks he is having an allergic reaction to his BP medication because it is making him anxious, pt also recovering from pneumonia, seen for this yesterday, no distress noted

## 2014-10-03 NOTE — ED Notes (Signed)
Patient transported to X-ray 

## 2014-10-03 NOTE — Telephone Encounter (Signed)
Pt scheduled appt regarding medication reaction but would like to speak to nurse in the meantime, please f/u with pt.

## 2014-10-03 NOTE — ED Provider Notes (Signed)
CSN: [id number]     Arrival date & time 10/03/14  1819 History   First MD Initiated Contact with Patient 10/03/14 1956     Chief Complaint  Patient presents with  . Hypertension     (Consider location/radiation/quality/duration/timing/severity/associated sxs/prior Treatment) HPI Comments: Patient is a 34 year old male past medical history significant for hypertension, pseudohypoparathyroidism presenting to the emergency department for possible allergic reaction to his hypertensive medications. Patient states he was seen yesterday for this but states since being discharged he is also developed lightheadedness and generalized headache. He endorses he continues to have his month long symptoms of chest tightness, nonproductive cough, abdominal bloating. States he read the side effects associated with his blood pressure medication, called his primary care physician who advised him to come in the morning for medication change was advised to stop taking his blood pressure medication today.  Patient is a 34 y.o. male presenting with hypertension.  Hypertension Associated symptoms include abdominal pain (bloating), coughing, headaches and nausea. Pertinent negatives include no chills, fever, numbness, vomiting or weakness.    Past Medical History  Diagnosis Date  . Pseudohypoparathyroidism   . Hypertension 2006    first told his BP was high, never treated   . Obesity   . Allergy    History reviewed. No pertinent past surgical history. Family History  Problem Relation Age of Onset  . Diabetes Mother   . Diabetes Father   . Hypertension Other    History  Substance Use Topics  . Smoking status: Never Smoker   . Smokeless tobacco: Never Used  . Alcohol Use: No    Review of Systems  Constitutional: Negative for fever and chills.  Respiratory: Positive for cough and chest tightness.   Gastrointestinal: Positive for nausea and abdominal pain (bloating). Negative for vomiting and  constipation.  Neurological: Positive for light-headedness and headaches. Negative for syncope, weakness and numbness.  All other systems reviewed and are negative.     Allergies  Review of patient's allergies indicates no known allergies.  Home Medications   Prior to Admission medications   Medication Sig Start Date End Date Taking? Authorizing Provider  acetaminophen (TYLENOL) 500 MG tablet Take 1,000 mg by mouth daily as needed for mild pain or moderate pain.   Yes Historical Provider, MD  albuterol (PROVENTIL HFA;VENTOLIN HFA) 108 (90 BASE) MCG/ACT inhaler Inhale 2 puffs into the lungs every 6 (six) hours as needed for wheezing or shortness of breath. 09/26/14  Yes Josalyn C Funches, MD  diltiazem (TIAZAC) 240 MG 24 hr capsule Take 480 mg by mouth daily.   Yes Historical Provider, MD  fexofenadine (ALLEGRA ALLERGY) 60 MG tablet Take 1 tablet (60 mg total) by mouth 2 (two) times daily. 09/26/14  Yes Josalyn C Funches, MD  fluticasone (FLONASE) 50 MCG/ACT nasal spray Place 2 sprays into both nostrils daily.   Yes Historical Provider, MD  levofloxacin (LEVAQUIN) 750 MG tablet Take 1 tablet (750 mg total) by mouth daily. 09/28/14  Yes Einar Gip Dansie, PA-C  lisinopril (PRINIVIL,ZESTRIL) 40 MG tablet Take 1 tablet (40 mg total) by mouth daily. 09/26/14  Yes Lora Paula, MD  predniSONE (DELTASONE) 50 MG tablet One tablet PO daily for 4 days 09/30/14  Yes Joya Gaskins, MD  amLODipine (NORVASC) 10 MG tablet Take 1 tablet (10 mg total) by mouth daily. Patient not taking: Reported on 10/03/2014 10/03/14   Lora Paula, MD  calcium citrate (CALCITRATE - DOSED IN MG ELEMENTAL CALCIUM) 950 MG tablet Take 1  tablet (200 mg of elemental calcium total) by mouth 2 (two) times daily. Patient not taking: Reported on 10/03/2014 09/06/14   Lora Paula, MD  guaiFENesin (ROBITUSSIN) 100 MG/5ML SOLN Take 5 mLs (100 mg total) by mouth every 4 (four) hours as needed for cough or to loosen  phlegm. Patient not taking: Reported on 10/03/2014 09/28/14   Einar Gip Dansie, PA-C   BP 152/99 mmHg  Pulse 94  Temp(Src) 98 F (36.7 C) (Oral)  Resp 15  Wt 331 lb 3 oz (150.226 kg)  SpO2 96% Physical Exam  Constitutional: He is oriented to person, place, and time. He appears well-developed and well-nourished. No distress.  HENT:  Head: Normocephalic and atraumatic.  Right Ear: External ear normal.  Left Ear: External ear normal.  Nose: Nose normal.  Mouth/Throat: Oropharynx is clear and moist. No oropharyngeal exudate.  Eyes: Conjunctivae and EOM are normal. Pupils are equal, round, and reactive to light.  Neck: Normal range of motion. Neck supple.  Cardiovascular: Normal rate, regular rhythm, normal heart sounds and intact distal pulses.   Pulmonary/Chest: Effort normal and breath sounds normal.  Abdominal: Soft. Bowel sounds are normal. There is no tenderness.  Musculoskeletal: Normal range of motion. He exhibits no edema.  Lymphadenopathy:    He has no cervical adenopathy.  Neurological: He is alert and oriented to person, place, and time. No cranial nerve deficit.  Moves all extremities without ataxia.  Skin: Skin is warm and dry. He is not diaphoretic.  Psychiatric: He has a normal mood and affect.  Nursing note and vitals reviewed.   ED Course  Procedures (including critical care time) Medications  LORazepam (ATIVAN) tablet 1 mg (1 mg Oral Given 10/03/14 2033)  diphenhydrAMINE (BENADRYL) capsule 25 mg (25 mg Oral Given 10/03/14 2217)  metoCLOPramide (REGLAN) tablet 10 mg (10 mg Oral Given 10/03/14 2217)    Labs Review Labs Reviewed  I-STAT CHEM 8, ED - Abnormal; Notable for the following:    Calcium, Ion 0.79 (*)    All other components within normal limits  I-STAT TROPOININ, ED    Imaging Review Dg Chest 2 View  10/02/2014   CLINICAL DATA:  Chest pain and dry cough for 4 days.  EXAM: CHEST  2 VIEW  COMPARISON:  PA and lateral chest 09/28/2014 and 08/12/2014.   FINDINGS: Right upper and lower lobe airspace disease seen on the most recent examination has resolved. The lungs appear clear. No consolidative process, pneumothorax or effusion. Heart size is normal.  IMPRESSION: No acute disease.   Electronically Signed   By: Drusilla Kanner M.D.   On: 10/02/2014 20:02   Dg Abd Acute W/chest  10/03/2014   CLINICAL DATA:  Lightheaded, constipation, shortness of breath  EXAM: ACUTE ABDOMEN SERIES (ABDOMEN 2 VIEW & CHEST 1 VIEW)  COMPARISON:  10/02/2014  FINDINGS: There is no evidence of dilated bowel loops or free intraperitoneal air. No radiopaque calculi or other significant radiographic abnormality is seen. Heart size and mediastinal contours are within normal limits. Both lungs are clear.  IMPRESSION: Negative abdominal radiographs.  No acute cardiopulmonary disease.   Electronically Signed   By: Elige Ko   On: 10/03/2014 21:33     EKG Interpretation None       Date: 10/03/2014  Rate: 96  Rhythm: normal sinus rhythm  QRS Axis: normal  Intervals: normal  ST/T Wave abnormalities: normal  Conduction Disutrbances:none  Narrative Interpretation:   Old EKG Reviewed: unchanged   MDM   Final diagnoses:  Essential hypertension    Filed Vitals:   10/03/14 2200  BP: 152/99  Pulse: 94  Temp:   Resp: 15   Afebrile, NAD, non-toxic appearing, AAOx4.  I have reviewed nursing notes, vital signs, and all appropriate lab and imaging results for this patient.  Patient noted to be hypertensive in the emergency department.  No signs of hypertensive urgency.  Discussed with patient the importance of keeping his follow-up appointment in the morning with his primary care physician to discuss adequate blood pressure control. Patient's symptoms greatly improved after Ativan administration. Labs reviewed without abnormalities. Imaging reviewed without abnormality. EKG unremarkable. Doubt ACS or PE. Again advised to continue Levaquin and steroids. Follow up with  pulmonary as scheduled next week. Return precautions discussed. Patient is stable at time of discharge      Jeannetta EllisJennifer L Vianca Bracher, PA-C 10/04/14 0106  Harrold DonathNathan R. Rubin PayorPickering, MD 10/07/14 [phone]

## 2014-10-03 NOTE — Telephone Encounter (Signed)
Pt stated congestion, bloating and constipation. Sx started since new Rx Dialtizem Can this medication be change?

## 2014-10-04 ENCOUNTER — Ambulatory Visit: Payer: BC Managed Care – PPO | Attending: Family Medicine | Admitting: Family Medicine

## 2014-10-04 ENCOUNTER — Encounter: Payer: Self-pay | Admitting: Family Medicine

## 2014-10-04 ENCOUNTER — Ambulatory Visit: Payer: BC Managed Care – PPO | Admitting: Family Medicine

## 2014-10-04 VITALS — BP 139/92 | HR 109 | Temp 99.4°F | Resp 18 | Ht 69.0 in | Wt 330.0 lb

## 2014-10-04 DIAGNOSIS — R05 Cough: Secondary | ICD-10-CM | POA: Diagnosis not present

## 2014-10-04 DIAGNOSIS — F411 Generalized anxiety disorder: Secondary | ICD-10-CM

## 2014-10-04 DIAGNOSIS — F419 Anxiety disorder, unspecified: Secondary | ICD-10-CM | POA: Insufficient documentation

## 2014-10-04 DIAGNOSIS — T461X5A Adverse effect of calcium-channel blockers, initial encounter: Secondary | ICD-10-CM | POA: Diagnosis not present

## 2014-10-04 DIAGNOSIS — R109 Unspecified abdominal pain: Secondary | ICD-10-CM | POA: Diagnosis not present

## 2014-10-04 DIAGNOSIS — I10 Essential (primary) hypertension: Secondary | ICD-10-CM | POA: Insufficient documentation

## 2014-10-04 MED ORDER — PROPRANOLOL HCL 40 MG PO TABS: 40.0000 mg | ORAL_TABLET | Freq: Two times a day (BID) | ORAL | Status: DC

## 2014-10-04 NOTE — Patient Instructions (Addendum)
[name],  1. HTN: Goal BP < 140/90  Continue norvasc 10 mg daily  Continue lisinopril 40 mg daily  Propranolol 40 mg BID-new for blood pressure and anxiety  2. Anxiety: [facility]ol [facility].  Since you have BCBS you can also go online and find counselors in network  F/u in 3 weeks with RN for BP check  F/u with me in 3 months   Dr. Armen PickupFunches

## 2014-10-04 NOTE — Assessment & Plan Note (Signed)
1. HTN: Goal BP < 140/90  Continue norvasc 10 mg daily  Continue lisinopril 40 mg daily  Propranolol 40 mg BID-new for blood pressure and anxiety

## 2014-10-04 NOTE — Progress Notes (Signed)
   Subjective:    Patient ID: Peter Hahn, male    DOB: 05/23/1981, 34 y.o.   MRN: [id number] CC: medication reaction, HTN, anxiety  HPI 34 yo M:  1. HTN: stopped diltiazem and started norvasc. Feeling like congestion, cough and constipation have improved. Feels anxious. Has history of many years of anxiety associated with concerns about his health and fearing death.  2. Anxiety: interested in medication and therapy. Has never had either. Normally sleeps well. Denies performance anxiety and agoraphobia.   Soc Hx: chronic non smoker  Review of Systems As per HPI     Objective:   Physical Exam BP 139/92 mmHg  Pulse 109  Temp(Src) 99.4 F (37.4 C) (Oral)  Resp 18  Ht 5\' 9"  (1.753 m)  Wt 330 lb (149.687 kg)  BMI 48.71 kg/m2  SpO2 96%  Wt Readings from Last 3 Encounters:  10/04/14 330 lb (149.687 kg)  10/03/14 331 lb 3 oz (150.226 kg)  10/02/14 339 lb (153.769 kg)   BP Readings from Last 3 Encounters:  10/04/14 139/92  10/03/14 145/79  10/02/14 133/70  General appearance: alert, cooperative and no distress  Nose: swollen nasal turbinate R side  Lungs: clear to auscultation bilaterally Heart: regular rate and rhythm, S1, S2 normal, no murmur, click, rub or gallop      Assessment & Plan:

## 2014-10-04 NOTE — Assessment & Plan Note (Signed)
  2. Anxiety: Propranolol Counseling services available at [facility].  Since you have BCBS you can also go online and find counselors in network

## 2014-10-04 NOTE — Progress Notes (Signed)
Pt returns today with medication reaction to prescribed Diltiazem medication Pt states sx's started with cough,congestion,dry skin with abdominal pain which has resolved Pt just stopped taking medication yesterday Pt appears worried and restless with taking vital signs BP- 156/85 112 Denies chest pain or SOB

## 2014-10-07 ENCOUNTER — Encounter: Payer: Self-pay | Admitting: Internal Medicine

## 2014-10-07 ENCOUNTER — Telehealth: Payer: Self-pay | Admitting: Family Medicine

## 2014-10-07 ENCOUNTER — Ambulatory Visit (INDEPENDENT_AMBULATORY_CARE_PROVIDER_SITE_OTHER): Payer: BC Managed Care – PPO | Admitting: Internal Medicine

## 2014-10-07 VITALS — BP 124/90 | HR 85 | Ht 69.5 in | Wt 328.0 lb

## 2014-10-07 DIAGNOSIS — E201 Pseudohypoparathyroidism: Secondary | ICD-10-CM

## 2014-10-07 DIAGNOSIS — Z23 Encounter for immunization: Secondary | ICD-10-CM | POA: Diagnosis not present

## 2014-10-07 DIAGNOSIS — J189 Pneumonia, unspecified organism: Secondary | ICD-10-CM | POA: Diagnosis not present

## 2014-10-07 NOTE — Telephone Encounter (Signed)
Pt is interested in a referral to an endocrinologist. Please follow up with pt.   °

## 2014-10-07 NOTE — Patient Instructions (Signed)
Pneumovax 23 pneumonia vaccine  Sample Breo Ellipta 200     1 puff then rinse mouth one time daily

## 2014-10-07 NOTE — Progress Notes (Signed)
10/07/14- 33 yoM never smoker  Referred by Hospital.  Recurrent bronchitis and pneumonia.   No prior pneumonia. Given rescue inhaler once in college. 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  Pneumonia Rx'd outpatient Aug 12, 2014. Cleared on f/u CXR. Then Feb 27 RUL and RLL pneumonia, cleared on f/u 10/02/14. No prior flu or pneumonia vaccine. 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 Still some cough, clear mucus. HIV Neg 08/26/14 and he reports no risk. Denies GERD. Does not wake dyspneic or coughing.  Prior to Admission medications   Medication Sig Start Date End Date Taking? Authorizing Provider  acetaminophen (TYLENOL) 500 MG tablet Take 1,000 mg by mouth daily as needed for mild pain or moderate pain.   Yes Historical Provider, MD  amLODipine (NORVASC) 10 MG tablet Take 1 tablet (10 mg total) by mouth daily. 10/03/14  Yes Josalyn Funches, MD  calcium citrate (CALCITRATE - DOSED IN MG ELEMENTAL CALCIUM) 950 MG tablet Take 1 tablet (200 mg of elemental calcium total) by mouth 2 (two) times daily. 09/06/14  Yes Josalyn Funches, MD  fluticasone (FLONASE) 50 MCG/ACT nasal spray Place 2 sprays into both nostrils daily.   Yes Historical Provider, MD  lisinopril (PRINIVIL,ZESTRIL) 40 MG tablet Take 1 tablet (40 mg total) by mouth daily. 09/26/14  Yes Josalyn Funches, MD  propranolol (INDERAL) 40 MG tablet Take 1 tablet (40 mg total) by mouth 2 (two) times daily. 10/04/14  Yes Dessa PhiJosalyn Funches, MD   Past Medical History  Diagnosis Date  . Pseudohypoparathyroidism   . Hypertension 2006    first told his BP was high, never treated   . Obesity   . Allergy    No past surgical history on file. Family History  Problem Relation Age of Onset  . Diabetes Mother   . Diabetes Father   . Hypertension Father   . Hypertension Paternal Grandmother    History   Social History  . Marital Status: Single    Spouse Name: N/A  .  Number of Children: 0  . Years of Education: N/A   Occupational History  . Not on file.   Social History Main Topics  . Smoking status: Never Smoker   . Smokeless tobacco: Never Used  . Alcohol Use: No  . Drug Use: 1.00 per week    Special: Marijuana     Comment: only smoked for 1 year while in college back in 2000  . Sexual Activity:    Partners: Female    Pharmacist, hospitalBirth Control/ Protection: Condom   Other Topics Concern  . Not on file   Social History Narrative   ROS-see HPI   Negative unless "+" Constitutional:    weight loss, night sweats, fevers, chills, fatigue, lassitude. HEENT:    headaches, difficulty swallowing, tooth/dental problems, +sore throat,       sneezing, itching, ear ache, +nasal congestion, post nasal drip, snoring CV:    chest pain, orthopnea, PND, swelling in lower extremities, anasarca,                                  dizziness, +palpitations Resp:   +shortness of breath with exertion or at rest.                +productive cough,   non-productive cough, coughing up of blood.  change in color of mucus.  wheezing.   Skin:    rash or lesions. GI:  No-   heartburn, indigestion, abdominal pain, nausea, vomiting, diarrhea,                 change in bowel habits, loss of appetite GU: dysuria, change in color of urine, no urgency or frequency.   flank pain. MS:   joint pain, stiffness, decreased range of motion, back pain. Neuro-     nothing unusual Psych:  change in mood or affect.  depression or +anxiety.   memory loss.  OBJ- Physical Exam General- Alert, Oriented, Affect-appropriate, Distress- none acute, + obese Skin- rash-none, lesions- none, excoriation- none Lymphadenopathy- none Head- atraumatic            Eyes- Gross vision intact, PERRLA, conjunctivae and secretions clear            Ears- Hearing, canals-normal            Nose- Clear, no-Septal dev, mucus, polyps, erosion, perforation             Throat- Mallampati III , mucosa  -tongue+coated , drainage- none, tonsils- atrophic Neck- flexible , trachea midline, no stridor , thyroid nl, carotid no bruit Chest - symmetrical excursion , unlabored           Heart/CV- RRR , no murmur , no gallop  , no rub, nl s1 s2                           - JVD- none , edema- none, stasis changes- none, varices- none           Lung- clear to P&A, wheeze- none, cough-+raspy , dullness-none, rub- none           Chest wall-  Abd-  Br/ Gen/ Rectal- Not done, not indicated Extrem- cyanosis- none, clubbing, none, atrophy- none, strength- nl Neuro- grossly intact to observation

## 2014-10-07 NOTE — Telephone Encounter (Signed)
Pt is interested in a referral to an endocrinologist. Please follow up with pt.

## 2014-10-07 NOTE — Telephone Encounter (Signed)
Endocrinology referral placed. Please inform patient.

## 2014-10-08 ENCOUNTER — Telehealth: Payer: Self-pay | Admitting: Family Medicine

## 2014-10-08 NOTE — Telephone Encounter (Signed)
Pt would like to be Referred to an endocronologist. Please f/u with pt.

## 2014-10-09 ENCOUNTER — Telehealth: Payer: Self-pay | Admitting: General Practice

## 2014-10-09 NOTE — Telephone Encounter (Signed)
Patient calling to follow up on request for endocrinology referral. Informed patient that the referral was placed on 10/07/14, and for him to allow at least 5-10 days for a update from the in house referral specialist. Patient verbalized understanding. Patient is now requesting a letter stating that he can return back to work. Patient was last seen by PCP on 10/04/14. Please assist.

## 2014-10-09 NOTE — Telephone Encounter (Signed)
Referral was placed when patient when patient first called about it, 2 days ago.  Please see chart. Please inform patient.

## 2014-10-15 ENCOUNTER — Encounter: Payer: Self-pay | Admitting: Internal Medicine

## 2014-10-15 DIAGNOSIS — Z23 Encounter for immunization: Secondary | ICD-10-CM | POA: Diagnosis not present

## 2014-10-15 NOTE — Assessment & Plan Note (Addendum)
Clinically resolving with some residual bronchitis that should clear. If persistent he will need assessment for cultures and immune status. Plan- CXR, flu and pneumovax, sample Breo to help with residual bronchitis

## 2014-10-15 NOTE — Assessment & Plan Note (Signed)
Managed elsewhere 

## 2014-10-17 ENCOUNTER — Ambulatory Visit: Payer: BC Managed Care – PPO | Attending: Family Medicine | Admitting: *Deleted

## 2014-10-17 ENCOUNTER — Telehealth: Payer: Self-pay | Admitting: Internal Medicine

## 2014-10-17 VITALS — BP 160/86 | HR 94 | Temp 99.6°F | Resp 20

## 2014-10-17 DIAGNOSIS — I10 Essential (primary) hypertension: Secondary | ICD-10-CM | POA: Diagnosis present

## 2014-10-17 MED ORDER — FLUTICASONE FUROATE-VILANTEROL 100-25 MCG/INH IN AEPB: 1.0000 | INHALATION_SPRAY | Freq: Every day | RESPIRATORY_TRACT | Status: DC

## 2014-10-17 NOTE — Progress Notes (Signed)
Patient presents for BP check Med list reviewed; states taking all meds as directed Following low sodium diet  Encouraged to choose foods with 5% or less of daily value for sodium. Discussed walking 30 minutes per day for exercise. Patient recovering from bronchitis. Will begin exercising once feeling well Patient denies headaches, blurred vision, SHOB, chest pain or pressure  BP 160/86  left arm manually with large cuff Patient states that BP is always higher at Dr.'s office. [facility] and it is lower. Encouraged patient to keep BP log and bring to all future visits.  P 94 R 20  T  99.6 oral SPO2  97%  Per PCP: No changes to meds at this time  Patient advised to call for med refills at least 7 days before running out so as not to go without.  Patient aware that he is to f/u with PCP 3 months from last visit (Due 01/04/15)

## 2014-10-17 NOTE — Telephone Encounter (Signed)
Spoke with the pt  He states that his breathing has improved with Breo  Rx was sent to pharm  Nothing further needed per pt

## 2014-10-21 ENCOUNTER — Telehealth: Payer: Self-pay | Admitting: Internal Medicine

## 2014-10-21 NOTE — Telephone Encounter (Signed)
Patient says he has been taking Breo.  Patient [facility] information about [facility] online and has noticed that he is having sore throat and slight chest pain on the right side, only happens when he takes the BerkleyBreo.  As the day goes by, it goes away.  Patient would like to stop Breo.    No wheezing, no problems with breathing at this time, he doesn't think he needs anything.  Ok to stop?  Current Outpatient Prescriptions on File Prior to Visit  Medication Sig Dispense Refill  . acetaminophen (TYLENOL) 500 MG tablet Take 1,000 mg by mouth daily as needed for mild pain or moderate pain.    Marland Kitchen. amLODipine (NORVASC) 10 MG tablet Take 1 tablet (10 mg total) by mouth daily. 90 tablet 3  . calcium citrate (CALCITRATE - DOSED IN MG ELEMENTAL CALCIUM) 950 MG tablet Take 1 tablet (200 mg of elemental calcium total) by mouth 2 (two) times daily. 180 tablet 1  . fluticasone (FLONASE) 50 MCG/ACT nasal spray Place 2 sprays into both nostrils daily.    . Fluticasone Furoate-Vilanterol 100-25 MCG/INH AEPB Inhale 1 puff into the lungs daily. 60 each 5  . lisinopril (PRINIVIL,ZESTRIL) 40 MG tablet Take 1 tablet (40 mg total) by mouth daily. 90 tablet 1  . propranolol (INDERAL) 40 MG tablet Take 1 tablet (40 mg total) by mouth 2 (two) times daily. 60 tablet 2   No current facility-administered medications on file prior to visit.   Allergies  Allergen Reactions  . Diltiazem     Body aches, chills  . Pollen Extract     Sneezing, headaches

## 2014-10-21 NOTE — Telephone Encounter (Signed)
Fine to stop Breo. Let us know if he has problems.

## 2014-10-21 NOTE — Telephone Encounter (Signed)
Pt aware of rec's per Dr Young. Nothing further needed. 

## 2014-10-23 ENCOUNTER — Telehealth: Payer: Self-pay | Admitting: Family Medicine

## 2014-10-23 ENCOUNTER — Telehealth: Payer: Self-pay | Admitting: Internal Medicine

## 2014-10-23 NOTE — Telephone Encounter (Signed)
Pt called requesting to speak to nurse, pt would like to know if he is able to take ibuprofen  for pain . Please f/u with pt

## 2014-10-23 NOTE — Telephone Encounter (Signed)
Patient requesting appointment before 11/03/2014.  Patient is going back to work on 11/03/14 and he would like to make sure he is fine to go back to work since he had trouble with the inhaler.  Dr. Maple HudsonYoung, are there any slots we can open for him before that date?  Please advise.

## 2014-10-24 ENCOUNTER — Ambulatory Visit: Payer: BC Managed Care – PPO | Attending: Family Medicine | Admitting: *Deleted

## 2014-10-24 VITALS — BP 127/81 | HR 93 | Temp 98.6°F | Resp 16

## 2014-10-24 DIAGNOSIS — I10 Essential (primary) hypertension: Secondary | ICD-10-CM | POA: Diagnosis not present

## 2014-10-24 DIAGNOSIS — Z0130 Encounter for examination of blood pressure without abnormal findings: Secondary | ICD-10-CM

## 2014-10-24 NOTE — Progress Notes (Signed)
Patient presents for BP check Med list reviewed; states taking all meds as directed except stopped Breo 2 days ago due to headaches after using. Has f/u with pulmonologist already scheduled Patient following low sodium diet  Patient has not yet started exercising. Waiting till after pulmonology f/u Patient has been keeping BP log 21 readings over last 6 days ranging 118-154 / 65-96 16 readings < 140/90 5 reading with systolic > 140 Only 2 reading with diastolic > 90  BP 127/81 P 93 R 16  T  98.6 oral SPO2  97%  Lungs: Clear to auscultation with good breath sounds noted throughout  Patient advised to call for med refills at least 7 days before running out so as not to go without.  Patient aware that he is to f/u with PCP 3 months from last visit (Due 01/04/15)

## 2014-10-24 NOTE — Telephone Encounter (Signed)
Most days your scheduled is blocked at 11:15 and 11:30am. Could we use those times?

## 2014-10-24 NOTE — Telephone Encounter (Signed)
Pt scheduled for OV with CY 10/28/14 at 11:15 (ok per CY) Pt states that he had a reaction to Breo, breathing is fine right now but stopped taking Breo on Monday 10/14/14. Pt would like to discuss this at his OV on 3/38/16. Asking if there is anything he needs to do in the meantime since he is off the BrandtBreo.   Please advise Dr Maple HudsonYoung. Thanks.

## 2014-10-24 NOTE — Telephone Encounter (Signed)
Yes

## 2014-10-24 NOTE — Telephone Encounter (Signed)
Since his breathing is ok, i don't recommend we do anything for now instead of Breo. We will discuss at ov.

## 2014-10-24 NOTE — Telephone Encounter (Signed)
Pt is aware of CY's recommendation. Nothing further was needed. 

## 2014-10-24 NOTE — Telephone Encounter (Signed)
Please come to me with a couple of possible RN slots and I will look.I don't see those slots on my schedule.

## 2014-10-28 ENCOUNTER — Ambulatory Visit: Payer: BC Managed Care – PPO | Attending: Family Medicine | Admitting: *Deleted

## 2014-10-28 ENCOUNTER — Telehealth: Payer: Self-pay | Admitting: Family Medicine

## 2014-10-28 ENCOUNTER — Encounter: Payer: Self-pay | Admitting: Internal Medicine

## 2014-10-28 ENCOUNTER — Other Ambulatory Visit: Payer: BC Managed Care – PPO

## 2014-10-28 ENCOUNTER — Ambulatory Visit: Payer: BC Managed Care – PPO | Admitting: Internal Medicine

## 2014-10-28 ENCOUNTER — Encounter: Payer: Self-pay | Admitting: *Deleted

## 2014-10-28 DIAGNOSIS — J45901 Unspecified asthma with (acute) exacerbation: Secondary | ICD-10-CM

## 2014-10-28 MED ORDER — IPRATROPIUM BROMIDE HFA 17 MCG/ACT IN AERS: INHALATION_SPRAY | RESPIRATORY_TRACT | Status: DC

## 2014-10-28 NOTE — Telephone Encounter (Signed)
Pt complaining of low blood pressure over the weekend 93/53, 90/53, 105/56 Today without medication BP 126/90 Pt stated was at pulmonology today and was told Sx was probably due to BP medication Advised to come to [facility] for evaluation

## 2014-10-28 NOTE — Progress Notes (Signed)
 10/07/14- 33 yoM never smoker  Referred by Hospital.  Recurrent bronchitis and pneumonia.   No prior pneumonia. Given rescue inhaler once in college. 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  Pneumonia Rx'd outpatient Aug 12, 2014. Cleared on f/u CXR. Then Feb 27 RUL and RLL pneumonia, cleared on f/u 10/02/14. No prior flu or pneumonia vaccine. Unmarried, working as Corporate investment bankeracademic advisor and lecturer at [employer] with regular exposure to  students. Still some cough, clear mucus. HIV Neg 08/26/14 and he reports no risk. Denies GERD. Does not wake dyspneic or coughing.  10/28/14-  33 yoM never smoker  Referred by Hospital.  Recurrent bronchitis and pneumonia.  FOLLOWS FOR:  pt is having chest pain/dizziness,  and sob. Pt says cough has resolved.  CXR 10/02/14 IMPRESSION: No acute disease. Electronically Signed  By: Drusilla Kannerhomas Dalessio M.D.  On: 10/02/2014 20:02   ROS-see HPI   Negative unless "+" Constitutional:    weight loss, night sweats, fevers, chills, fatigue, lassitude. HEENT:    headaches, difficulty swallowing, tooth/dental problems, +sore throat,       sneezing, itching, ear ache, +nasal congestion, post nasal drip, snoring CV:    chest pain, orthopnea, PND, swelling in lower extremities, anasarca,                                  dizziness, +palpitations Resp:   +shortness of breath with exertion or at rest.                +productive cough,   non-productive cough, coughing up of blood.              change in color of mucus.  wheezing.   Skin:    rash or lesions. GI:  No-   heartburn, indigestion, abdominal pain, nausea, vomiting, diarrhea,                 change in bowel habits, loss of appetite GU: dysuria, change in color of urine, no urgency or frequency.   flank pain. MS:   joint pain, stiffness, decreased range of motion, back pain. Neuro-     nothing unusual Psych:  change in mood or affect.  depression or +anxiety.   memory loss.  OBJ- Physical  Exam General- Alert, Oriented, Affect-appropriate, Distress- none acute, + obese Skin- rash-none, lesions- none, excoriation- none Lymphadenopathy- none Head- atraumatic            Eyes- Gross vision intact, PERRLA, conjunctivae and secretions clear            Ears- Hearing, canals-normal            Nose- Clear, no-Septal dev, mucus, polyps, erosion, perforation             Throat- Mallampati III , mucosa -tongue+coated , drainage- none, tonsils- atrophic Neck- flexible , trachea midline, no stridor , thyroid nl, carotid no bruit Chest - symmetrical excursion , unlabored           Heart/CV- RRR , no murmur , no gallop  , no rub, nl s1 s2                           - JVD- none , edema- none, stasis changes- none, varices- none           Lung- clear to P&A, wheeze- none, cough-+raspy , dullness-none, rub- none  Chest wall-  Abd-  Br/ Gen/ Rectal- Not done, not indicated Extrem- cyanosis- none, clubbing, none, atrophy- none, strength- nl Neuro- grossly intact to observation

## 2014-10-28 NOTE — Telephone Encounter (Signed)
When patient comes in for BP check. If BP wnl recommend: 1. D/c Ace inhibitor 2. Continue propranolol

## 2014-10-28 NOTE — Patient Instructions (Signed)
Order- lab- Allergy profile, Immunoglobulins (IgG, IgA, IgM)    Dx asthma with bronchitis  Order- schedule PFT  Script printed for a ipratropium inhaler. Use as a rescue inhaler, up to every 4-6 hours, only if needed

## 2014-10-28 NOTE — Progress Notes (Unsigned)
Patient here for BP check BP 154/95 pulse 104 Per Dr. Lesleigh NoeFunches-no change in meds and make follow up appointment with her next week  Patient appears very frustrated stating that he checks his blood pressure many times per day and they are "always normal" and then he come to our clinic and it is always high.  The patient says that he has symptoms of lightheadedness and chest pain and that he has been to the ED 6 times and his EKG's are always normal and that he thinks these symptoms are coming from the medicine that Dr. Armen PickupFunches has him on.  He went on to say that he had done research on line about lisinopril and that he believes Dr. Armen PickupFunches raised his dose inappropriately.  I explained that Dr. Armen PickupFunches had a full schedule today and could not see him and that she asked for him to make a follow up appointment to see her next week.  Patient reluctantly agreed.

## 2014-10-28 NOTE — Telephone Encounter (Signed)
Expand All Collapse All   When patient comes in for BP check, walk in clinic. If BP wnl recommend:  1. D/c Ace inhibitor  2. Continue propranolol and norvasc  3. RN f/u for BP check in 1 week 4. PCP f/u in 2 weeks

## 2014-10-28 NOTE — Telephone Encounter (Signed)
Pt called requesting to speak to nurse about bp being elevated. Pt states he has been having chest pain and SHOB , Please f/u with pt

## 2014-10-29 LAB — ALLERGY FULL PROFILE
Alternaria Alternata: 0.61 kU/L — ABNORMAL HIGH
Bahia Grass: <0.1 kU/L
Bermuda Grass: 0.11 kU/L — ABNORMAL HIGH
Box Elder IgE: 0.64 kU/L — ABNORMAL HIGH
Candida Albicans: <0.1 kU/L
Cat Dander: <0.1 kU/L
Common Ragweed: 3.26 kU/L — ABNORMAL HIGH
Elm IgE: 0.4 kU/L — ABNORMAL HIGH
G005 Rye, Perennial: <0.1 kU/L
G009 Red Top: <0.1 kU/L
GOLDENROD: 0.21 kU/L — AB
Helminthosporium halodes: <0.1 kU/L
IgE (Immunoglobulin E), Serum: 50 kU/L (ref <115)
Lamb's Quarters: <0.1 kU/L
Oak: 0.18 kU/L — ABNORMAL HIGH
Plantain: 0.37 kU/L — ABNORMAL HIGH
SYCAMORE TREE: 0.17 kU/L — AB
Stemphylium Botryosum: <0.1 kU/L
Timothy Grass: <0.1 kU/L

## 2014-10-29 LAB — IGG, IGA, IGM
IGG (IMMUNOGLOBIN G), SERUM: 1440 mg/dL (ref 650–1600)
IGM, SERUM: 157 mg/dL (ref 41–251)
IgA: 533 mg/dL — ABNORMAL HIGH (ref 68–379)

## 2014-11-01 NOTE — Telephone Encounter (Signed)
Pt advised to take Tylenol 1000 mg TID for pain since he is hypertensive

## 2014-11-04 ENCOUNTER — Encounter: Payer: Self-pay | Admitting: Family Medicine

## 2014-11-04 ENCOUNTER — Ambulatory Visit: Payer: BC Managed Care – PPO | Attending: Family Medicine | Admitting: Family Medicine

## 2014-11-04 DIAGNOSIS — I10 Essential (primary) hypertension: Secondary | ICD-10-CM | POA: Insufficient documentation

## 2014-11-04 DIAGNOSIS — R079 Chest pain, unspecified: Secondary | ICD-10-CM | POA: Insufficient documentation

## 2014-11-04 MED ORDER — PROPRANOLOL HCL 20 MG PO TABS: 20.0000 mg | ORAL_TABLET | Freq: Two times a day (BID) | ORAL | Status: DC

## 2014-11-04 NOTE — Progress Notes (Signed)
   Subjective:    Patient ID: Peter Hahn, Peter Hahn    DOB: 02/10/1981, 34 y.o.   MRN: [id number] CC: f/u HTN HPI  1. CHRONIC HYPERTENSION  Disease Monitoring  Blood pressure range: 97-110/56-68  Chest pain: yes, achy, substernal to R sided, with exertion only, but not consistently, worse in AM, but does occur throughout the day    Dyspnea: no   Claudication: no   Medication compliance: yes  Medication Side Effects  Lightheadedness: yes, last week but not today    Urinary frequency: no   Edema: no   Impotence: no   Preventitive Healthcare:  Salt Restriction: yes   2. Chest aches: no fever. No cough. No SOB. No chest pain or pressures. X 3-4 weeks was first contributing this to bronchitis/PNA but lungs are clear. Aches come and go. With exertion, but not consistently so. Substernal and radiates to R breast.   Soc Hx: non smoker   Review of Systems  Respiratory: Negative.   Cardiovascular: Positive for chest pain. Negative for palpitations and leg swelling.      Objective:   Physical Exam BP 133/89 mmHg  Pulse 91  Temp(Src) 99.4 F (37.4 C) (Oral)  Resp 18  Ht 5\' 10"  (1.778 m)  Wt 329 lb (149.233 kg)  BMI 47.21 kg/m2  SpO2 98% General appearance: alert, cooperative, no distress and morbidly obese Throat: lips, mucosa, and tongue normal; teeth and gums normal Lungs: clear to auscultation bilaterally Chest wall: no tenderness Heart: regular rate and rhythm, S1, S2 normal, no murmur, click, rub or gallop Extremities: extremities normal, atraumatic, no cyanosis or edema     Assessment & Plan:

## 2014-11-04 NOTE — Patient Instructions (Addendum)
Mr. Yetta BarreJones  Thank you for coming in today.   1. HTN: goal BP is < 140/90 with HR goal < 100 Titrate off propranolol from 40 mg twice daily to: 1. 20 mg twice daily for 1 weeks 2. 20 mg once daily for 1 week, the off.  F/u in 7-10 days with RN for BP check   Dr. Armen PickupFunches

## 2014-11-04 NOTE — Progress Notes (Signed)
F/U HTN Stated been having chest Pain and SOB  Worsen with movement Taking medication daily  No Hx tobacco

## 2014-11-04 NOTE — Assessment & Plan Note (Addendum)
A: HTN: goal BP is < 140/90 with HR goal < 100. Patient with chest aches, intermittent dizziness, low BPs at home.  P:  Titrate off propranolol from 40 mg twice daily to: 1. 20 mg twice daily for 1 weeks 2. 20 mg once daily for 1 week, the off.  F/u in 7-10 days with RN for BP check   Continue norvasc 10 and lisinopril 40

## 2014-11-08 ENCOUNTER — Ambulatory Visit: Payer: BC Managed Care – PPO | Admitting: Skilled Nursing Facility1

## 2014-11-11 ENCOUNTER — Other Ambulatory Visit: Payer: Self-pay | Admitting: Family Medicine

## 2014-11-12 ENCOUNTER — Other Ambulatory Visit: Payer: Self-pay | Admitting: Family Medicine

## 2014-11-14 ENCOUNTER — Telehealth: Payer: Self-pay | Admitting: Family Medicine

## 2014-11-14 NOTE — Telephone Encounter (Signed)
No refill needed per Dr Armen Pickupfunches note  Pt has Apt with nurse on 11/22/2014 Stated has uncomfortable feeling on rt arm, advised to schedule ap twith PCP Called transfer to front office for appointment

## 2014-11-14 NOTE — Telephone Encounter (Signed)
Patient called to speak to nurse about propranolol (INDERAL) 20 MG tablet , patient states that the pharmacy did not fill his medication. Please f/u with pt.

## 2014-11-22 ENCOUNTER — Ambulatory Visit: Payer: BC Managed Care – PPO | Attending: Family Medicine | Admitting: *Deleted

## 2014-11-22 VITALS — BP 150/70 | HR 105 | Temp 98.7°F | Resp 18

## 2014-11-22 DIAGNOSIS — I10 Essential (primary) hypertension: Secondary | ICD-10-CM | POA: Insufficient documentation

## 2014-11-22 NOTE — Progress Notes (Signed)
Patient presents for BP check after tapering off propanolol. Last dose 11/15/14 States no change in right sided chest ache, back ache and SHOB Denies dizziness Patient is not adding salt to foods or cooking with salt. Patient made aware of Mrs Sharilyn SitesDash as alternative to salt. Patient is trying to follow low sodium diet. Not walking for exercise at present due to chest ache and SHOB  Filed Vitals:   11/22/14 1519  BP: 150/70  Pulse: 105  Temp: 98.7 F (37.1 C)  Resp: 18    Per PCP: No changes at this time  Patient has f/u with PCP in 3 days

## 2014-11-26 ENCOUNTER — Encounter: Payer: Self-pay | Admitting: Family Medicine

## 2014-11-26 ENCOUNTER — Ambulatory Visit: Payer: BC Managed Care – PPO | Attending: Family Medicine | Admitting: Family Medicine

## 2014-11-26 VITALS — BP 132/77 | HR 108 | Temp 99.5°F | Resp 10 | Ht 70.0 in | Wt 331.0 lb

## 2014-11-26 DIAGNOSIS — J309 Allergic rhinitis, unspecified: Secondary | ICD-10-CM | POA: Insufficient documentation

## 2014-11-26 DIAGNOSIS — Z6841 Body Mass Index (BMI) 40.0 and over, adult: Secondary | ICD-10-CM | POA: Insufficient documentation

## 2014-11-26 DIAGNOSIS — I10 Essential (primary) hypertension: Secondary | ICD-10-CM | POA: Diagnosis present

## 2014-11-26 MED ORDER — CALCIUM CITRATE 950 (200 CA) MG PO TABS: 400.0000 mg | ORAL_TABLET | Freq: Four times a day (QID) | ORAL | Status: DC

## 2014-11-26 MED ORDER — LISINOPRIL 40 MG PO TABS: 20.0000 mg | ORAL_TABLET | Freq: Every day | ORAL | Status: DC

## 2014-11-26 NOTE — Progress Notes (Signed)
   Subjective:    Patient ID: Peter Hahn, male    DOB: 08/04/1980, 34 y.o.   MRN: [id number] CC: body ache and sore throat  HPI  1. CHRONIC HYPERTENSION  Disease Monitoring  Blood pressure range: 105-130/57-76, HR 80-95  Chest pain: no   Dyspnea: no   Claudication: no   Medication compliance: yes  Medication Side Effects  Lightheadedness: no   Urinary frequency: no   Edema: no    Preventitive Healthcare:  Exercise: yes, moderate   Diet Pattern: regular meals   Salt Restriction: low salt   2. body aches: achy in R upper chest and low back. Has known hypocalcemia. Taking cal citrate 950 mg twice daily. No fever or chills.  3.  sore throat: R sided sore throat off an on. No post nasal drip. No reflux. No fever. Better with chloraseptic lozenges.   Soc Hx: non smoker   Review of Systems  Constitutional: Negative for fever and chills.  HENT: Positive for sore throat.   Eyes: Negative for visual disturbance.  Respiratory: Negative for shortness of breath.   Cardiovascular: Negative for chest pain, palpitations and leg swelling.  Musculoskeletal: Positive for myalgias.  Neurological: Negative for headaches.     Objective:   Physical Exam BP 132/77 mmHg  Pulse 108  Temp(Src) 99.5 F (37.5 C) (Oral)  Resp 10  Ht 5\' 10"  (1.778 m)  Wt 331 lb (150.141 kg)  BMI 47.49 kg/m2  SpO2 100%  Wt Readings from Last 3 Encounters:  11/26/14 331 lb (150.141 kg)  11/04/14 329 lb (149.233 kg)  10/28/14 323 lb (146.512 kg)  General appearance: alert, cooperative, no distress and morbidly obese  Head: Normocephalic, without obvious abnormality, atraumatic Eyes: conjunctivae/corneas clear. PERRL, EOM's intact.  Ears: normal TM's and external ear canals both ears Nose: no discharge, turbinates pink, swollen Throat: lips, mucosa, and tongue normal; teeth and gums normal Neck: no adenopathy and thyroid not enlarged, symmetric, no tenderness/mass/nodules Lungs: clear to auscultation  bilaterally Heart: regular rate and rhythm, S1, S2 normal, no murmur, click, rub or gallop        Assessment & Plan:

## 2014-11-26 NOTE — Progress Notes (Signed)
F/U HTN Blood Pressure been running within normal limits No Hx Tobacco  Complaining of body ache and sore throat  Stated possible reaction to Lisinopril

## 2014-11-26 NOTE — Patient Instructions (Signed)
[name],  Thank you for coming in today.  1. HTN: Goal BP < 140/90 Decrease lisinopril to 20 mg once daily  Continue norvasc 10 mg daily  2. Low calcium: Continue cal citrate increase to two tabs 4 times a day with food   3. Sore throat: normal exam Continue flonase Continue lozenges Gargle with listerine or warm salt water as needed- swish and spit   F/u in 6 weeks with RN for BP check  F/u in 3 months with me for BP   Dr. Armen PickupFunches

## 2014-11-26 NOTE — Assessment & Plan Note (Signed)
A: HTN: BP at goal as well as pulse with home monitoring.  P: Goal BP < 140/90 Decrease lisinopril to 20 mg once daily  Continue norvasc 10 mg daily

## 2014-11-26 NOTE — Assessment & Plan Note (Addendum)
Low calcium: Continue cal citrate increase to two tabs 4 times a day with food

## 2014-11-26 NOTE — Assessment & Plan Note (Signed)
Sore throat: normal exam Continue flonase Continue lozenges Gargle with listerine or warm salt water as needed- swish and spit

## 2014-11-29 ENCOUNTER — Encounter: Payer: BC Managed Care – PPO | Attending: Family Medicine | Admitting: Skilled Nursing Facility1

## 2014-11-29 DIAGNOSIS — Z6841 Body Mass Index (BMI) 40.0 and over, adult: Secondary | ICD-10-CM | POA: Insufficient documentation

## 2014-11-29 DIAGNOSIS — Z713 Dietary counseling and surveillance: Secondary | ICD-10-CM | POA: Insufficient documentation

## 2014-11-29 NOTE — Progress Notes (Signed)
  Medical Nutrition Therapy:  Appt start time: 1000 end time:  1030.   Assessment:  Primary concerns today: Follow up for obesity. Pt weighs 331 pounds according to doctors office. Pt states he just got over another bought of pneumonia March 3rd so he is still recuperating. Pts parents are still living with him and cooking his meals. Pt states he has not had any fast food since December and no pizza. Pt states he feels his portions are good but he is still struggling with incorporating non-starchy vegetables and variety in general in his meals. Pt states he has asked for a lower dosage of lisinopril due to it possibly causing body aches. Pt states he is having trouble eating fish because he does not know how to cook it.   Preferred Learning Style:   Auditory  Visual  Learning Readiness:   Change in progress   MEDICATIONS: See List   DIETARY INTAKE:  Usual eating pattern includes 3 meals and 3 snacks per day.  Everyday foods include none stated.  Avoided foods include non-starchy vegetables.    24-hr recall:  B ( AM): eggs and cheese toast and [place] bacon Snk ( AM): fruits and peanuts L ( PM): cabbage, navy beans, chicken tenders Snk ( PM): fruits and peanuts D ( PM): cabbage, navy beans, chicken tenders Snk ( PM): fruit and peanuts Beverages: water, gingerale   Usual physical activity: ADL's  Estimated energy needs: 1800 calories 200 g carbohydrates 135 g protein 50 g fat  Progress Towards Goal(s):  In progress.   Nutritional Diagnosis:  NI-5.9.1 Inadequate vitamin intake As related to poor consumption of non-starchy vegeables.  As evidenced by pt report.    Intervention:  Nutrition counseling for obesity. Dietitian educated pt on finding recipes, incorporating fish, and incorporating vegetables. Dietitian also discussed the importance of physical activity.  Goals: -Try a new vegetable every week -Try to avoid fried food -Try to be physically active every  day -Look for recipes online  Teaching Method Utilized:  Visual Auditory  Handouts given during visit include:  Low sodium flavoring options  Barriers to learning/adherence to lifestyle change: Recent illness  Demonstrated degree of understanding via:  Teach Back   Monitoring/Evaluation:  Dietary intake, exercise, variety, and body weight prn.

## 2014-11-29 NOTE — Patient Instructions (Signed)
-  Try a new vegetable every week -Try to avoid fried food -Try to be physically active every day -Look for recipes online

## 2014-12-10 ENCOUNTER — Ambulatory Visit: Payer: BC Managed Care – PPO | Admitting: Internal Medicine

## 2015-01-01 ENCOUNTER — Ambulatory Visit: Payer: BC Managed Care – PPO | Admitting: Internal Medicine

## 2015-01-31 ENCOUNTER — Ambulatory Visit: Payer: BC Managed Care – PPO | Admitting: Skilled Nursing Facility1

## 2015-02-24 ENCOUNTER — Ambulatory Visit: Payer: BC Managed Care – PPO | Admitting: Skilled Nursing Facility1

## 2015-03-11 ENCOUNTER — Encounter: Payer: BC Managed Care – PPO | Admitting: Pharmacist

## 2015-03-17 ENCOUNTER — Ambulatory Visit: Payer: BC Managed Care – PPO | Admitting: Internal Medicine

## 2015-04-16 ENCOUNTER — Ambulatory Visit: Payer: BC Managed Care – PPO | Admitting: Skilled Nursing Facility1

## 2015-06-23 ENCOUNTER — Ambulatory Visit: Payer: BC Managed Care – PPO | Attending: Family Medicine | Admitting: Family Medicine

## 2015-06-23 ENCOUNTER — Encounter: Payer: Self-pay | Admitting: Family Medicine

## 2015-06-23 VITALS — BP 146/86 | HR 118 | Temp 99.4°F | Resp 16 | Ht 70.0 in | Wt 362.0 lb

## 2015-06-23 DIAGNOSIS — L301 Dyshidrosis [pompholyx]: Secondary | ICD-10-CM | POA: Diagnosis not present

## 2015-06-23 DIAGNOSIS — Z7951 Long term (current) use of inhaled steroids: Secondary | ICD-10-CM | POA: Diagnosis not present

## 2015-06-23 DIAGNOSIS — R21 Rash and other nonspecific skin eruption: Secondary | ICD-10-CM | POA: Diagnosis present

## 2015-06-23 DIAGNOSIS — Z79899 Other long term (current) drug therapy: Secondary | ICD-10-CM | POA: Diagnosis not present

## 2015-06-23 MED ORDER — TRIAMCINOLONE ACETONIDE 0.5 % EX OINT: 1.0000 "application " | TOPICAL_OINTMENT | Freq: Two times a day (BID) | CUTANEOUS | Status: DC

## 2015-06-23 NOTE — Progress Notes (Signed)
Rash on arm and legs x1 week  Itching no pain  No tobacco user  No suicide thought in the past two weeks

## 2015-06-23 NOTE — Progress Notes (Signed)
Patient ID: Peter Hahn, male   DOB: 05/14/1981, 34 y.o.   MRN: [id number]   Subjective:  Patient ID: Peter Hahn, male    DOB: 02/12/1981  Age: 34 y.o. MRN: [id number]  CC: Rash   HPI Peter Hahn presents for   1. Rash: on hands and back. X one week. Has hx of dry skin. No hx of eczema. No close contacts with rash. Has started using fragrance free moisturizers.   Social History  Substance Use Topics  . Smoking status: Never Smoker   . Smokeless tobacco: Never Used  . Alcohol Use: No    Outpatient Prescriptions Prior to Visit  Medication Sig Dispense Refill  . amLODipine (NORVASC) 10 MG tablet Take 1 tablet (10 mg total) by mouth daily. 90 tablet 3  . lisinopril (PRINIVIL,ZESTRIL) 40 MG tablet Take 0.5 tablets (20 mg total) by mouth daily. 90 tablet 1  . acetaminophen (TYLENOL) 500 MG tablet Take 1,000 mg by mouth daily as needed for mild pain or moderate pain.    . calcium citrate (CALCITRATE - DOSED IN MG ELEMENTAL CALCIUM) 950 MG tablet Take 2 tablets (400 mg of elemental calcium total) by mouth 4 (four) times daily. (Patient not taking: Reported on 06/23/2015) 270 tablet 1  . fluticasone (FLONASE) 50 MCG/ACT nasal spray Place 2 sprays into both nostrils daily.    Marland Kitchen. ipratropium (ATROVENT HFA) 17 MCG/ACT inhaler Inhale 2 puffs, every 6 hours as needed rescue (Patient not taking: Reported on 11/29/2014) 1 Inhaler 12   No facility-administered medications prior to visit.    ROS Review of Systems  Constitutional: Negative for fever, chills, fatigue and unexpected weight change.  Eyes: Negative for visual disturbance.  Respiratory: Negative for cough and shortness of breath.   Cardiovascular: Negative for chest pain, palpitations and leg swelling.  Gastrointestinal: Negative for nausea, vomiting, abdominal pain, diarrhea, constipation and blood in stool.  Endocrine: Negative for polydipsia, polyphagia and polyuria.  Musculoskeletal: Negative for myalgias, back pain, arthralgias,  gait problem and neck pain.  Skin: Positive for rash.  Allergic/Immunologic: Negative for immunocompromised state.  Hematological: Negative for adenopathy. Does not bruise/bleed easily.  Psychiatric/Behavioral: Negative for suicidal ideas, sleep disturbance and dysphoric mood. The patient is not nervous/anxious.     Objective:  BP 146/86 mmHg  Pulse 118  Temp(Src) 99.4 F (37.4 C) (Oral)  Resp 16  Ht 5\' 10"  (1.778 m)  Wt 362 lb (164.202 kg)  BMI 51.94 kg/m2  SpO2 99%  BP/Weight 06/23/2015 11/26/2014 11/22/2014  Systolic BP 146 132 150  Diastolic BP 86 77 70  Wt. (Lbs) 362 331 -  BMI 51.94 47.49 -   Physical Exam  Constitutional: He appears well-developed and well-nourished. No distress.  HENT:  Head: Normocephalic and atraumatic.  Neck: Normal range of motion. Neck supple.  Cardiovascular: Normal rate, regular rhythm, normal heart sounds and intact distal pulses.   Pulmonary/Chest: Effort normal and breath sounds normal.  Musculoskeletal: He exhibits no edema.  Neurological: He is alert.  Skin: Skin is warm and dry. Rash noted. No erythema.     Psychiatric: He has a normal mood and affect.     Assessment & Plan:   Problem List Items Addressed This Visit    Dyshidrotic eczema - Primary   Relevant Medications   triamcinolone ointment (KENALOG) 0.5 %      No orders of the defined types were placed in this encounter.    Follow-up: No Follow-up on file.   Dessa PhiJosalyn Mercadez Heitman MD

## 2015-06-23 NOTE — Patient Instructions (Addendum)
Peter Hahn was seen today for rash.  Diagnoses and all orders for this visit:  Dyshidrotic eczema -     triamcinolone ointment (KENALOG) 0.5 %; Apply 1 application topically 2 (two) times daily. Apply to hand twice daily  General measures - In clinical experience, avoidance of irritants or exacerbating factors is beneficial for most patients with dyshidrotic eczema. General skin care measures aimed at reducing skin irritation and restoring the skin barrier include [23]: ?Using lukewarm water and soap-free cleansers to wash hands ?Drying hands thoroughly after washing ?Applying emollients (eg, petroleum jelly) immediately after hand drying and as often as possible  ?Wearing cotton gloves under vinyl or other nonlatex gloves when performing wet work ?Removing rings and watches and bracelets before wet work ?Wearing protective gloves in cold weather  ?Wearing task-specific gloves for frictional exposures (eg, gardening, carpentry) ?Avoiding exposure to irritants (eg, detergents, solvents, hair lotions or dyes, acidic foods [eg, citrus fruit])   F/u in 3-4 weeks for recheck, flu shot at f/u or sooner with nurse at your convenience   Dr. Armen PickupFunches   Hand Dermatitis Hand dermatitis (dyshidrotic eczema) is a skin condition in which small, itchy, raised dots or fluid-filled blisters form over the palms of the hands. Outbreaks of hand dermatitis can last 3 to 4 weeks. CAUSES  The cause of hand dermatitis is unknown. However, it occurs most often in patients with a history of allergies such as:  Hay fever.  Allergic asthma.  Allergies to latex. Chemical exposure, injuries, and environmental irritants can make hand dermatitis worse. Washing your hands too frequently can remove natural oils, which can dry out the skin and contribute to outbreaks of hand dermatitis. SYMPTOMS  The most common symptom of hand dermatitis is intense itching. Cracks or grooves (fissures) on the fingers can also develop.  Affected areas can be painful, especially areas where large blisters have formed. DIAGNOSIS Your caregiver can usually tell what the problem is by doing a physical exam. PREVENTION  Avoid excessive hand washing.  Avoid the use of harsh chemicals.  Wear protective gloves when handling products that can irritate your skin. TREATMENT  Steroid creams and ointments, such as over-the-counter 1% hydrocortisone cream, can reduce inflammation and improve moisture retention. These should be applied at least 2 to 4 times per day. Your caregiver may ask you to use a stronger prescription steroid cream to help speed the healing of blistered and cracked skin. In severe cases, oral steroid medicine may be needed. If you have an infection, antibiotics may be needed. Your caregiver may also prescribe antihistamines. These medicines help reduce itching. HOME CARE INSTRUCTIONS  Only take over-the-counter or prescription medicines as directed by your caregiver.  You may use wet or cold compresses. This can help:  Alleviate itching.  Increase the effectiveness of topical creams.  Minimize blisters. SEEK MEDICAL CARE IF:  The rash is not better after 1 week of treatment.  Signs of infection develop, such as redness, tenderness, or yellowish-white fluid (pus).  The rash is spreading.   This information is not intended to replace advice given to you by your health care provider. Make sure you discuss any questions you have with your health care provider.   Document Released: 07/19/2005 Document Revised: 10/11/2011 Document Reviewed: 01/31/2015 Elsevier Interactive Patient Education [employer]2016 [employer]

## 2015-08-06 MED FILL — ?AMLODIPINE BESYLATE 10 MG: 10 | 30 days supply | Qty: 30 | Fill #10

## 2015-08-15 ENCOUNTER — Telehealth: Payer: Self-pay | Admitting: Family Medicine

## 2015-08-15 ENCOUNTER — Ambulatory Visit: Payer: BC Managed Care – PPO | Attending: Family Medicine | Admitting: Family Medicine

## 2015-08-15 ENCOUNTER — Ambulatory Visit: Payer: BC Managed Care – PPO | Admitting: Family Medicine

## 2015-08-15 ENCOUNTER — Encounter: Payer: Self-pay | Admitting: Family Medicine

## 2015-08-15 VITALS — BP 149/84 | HR 105 | Temp 98.6°F | Resp 16 | Ht 70.0 in | Wt 366.0 lb

## 2015-08-15 DIAGNOSIS — L309 Dermatitis, unspecified: Secondary | ICD-10-CM | POA: Diagnosis not present

## 2015-08-15 DIAGNOSIS — Z0000 Encounter for general adult medical examination without abnormal findings: Secondary | ICD-10-CM

## 2015-08-15 DIAGNOSIS — I10 Essential (primary) hypertension: Secondary | ICD-10-CM | POA: Diagnosis not present

## 2015-08-15 LAB — POCT GLYCOSYLATED HEMOGLOBIN (HGB A1C): HEMOGLOBIN A1C: 5.7

## 2015-08-15 MED ORDER — LISINOPRIL 40 MG PO TABS: 40.0000 mg | ORAL_TABLET | Freq: Every day | ORAL | Status: DC

## 2015-08-15 MED ORDER — FLUOCINONIDE 0.05 % EX CREA: 1.0000 "application " | TOPICAL_CREAM | Freq: Two times a day (BID) | CUTANEOUS | Status: DC

## 2015-08-15 MED FILL — LISINOPRIL 40 MG TABLET: 40 | 30 days supply | Qty: 30 | Fill #0

## 2015-08-15 NOTE — Telephone Encounter (Signed)
Pt aware Rx Lisinopril to 40 mg 1 po QD  Pt verbalized understanding

## 2015-08-15 NOTE — Assessment & Plan Note (Signed)
bp elevated above goal Continue norvasc 10 mg daily Increase lisinopril to 40 mg daily

## 2015-08-15 NOTE — Patient Instructions (Signed)
Diagnoses and all orders for this visit:  Healthcare maintenance -     POCT glycosylated hemoglobin (Hb A1C)  Eczema -     fluocinonide cream (LIDEX) 0.05 %; Apply 1 application topically 2 (two) times daily.    Use thick, greasy, unscented moisturizer Change to lidex Keep appt with dermatology   F/u with me in 3 months sooner if needed for HTN  Dr. Armen PickupFunches

## 2015-08-15 NOTE — Telephone Encounter (Signed)
Please call patient and ask him to increase lisinopril to 40 mg daily as his BP was above goal at today's OV

## 2015-08-15 NOTE — Assessment & Plan Note (Signed)
Eczematous rash  Plan Change topical steroid to lidex Use thick emollient Keep derm f/u prn worsening or lack of improvement Advised patient consider and look out for food triggers

## 2015-08-15 NOTE — Progress Notes (Signed)
Patient ID: Peter Hahn, male   DOB: 09/04/1980, 35 y.o.   MRN: [id number]   Subjective:  Patient ID: Peter Hahn, male    DOB: 04/30/1981  Age: 35 y.o. MRN: [id number]  CC: Follow-up   HPI Peter Hahn presents for   1. Rash:  On arms and legs. Started on hands. Pruritic. He noticed soft tissue swelling with topical triamcinolone and stopped use. Using OTC eczema lotions with partial improvement. Has dermatology appointment scheduled for the end of next month.   Social History  Substance Use Topics  . Smoking status: Never Smoker   . Smokeless tobacco: Never Used  . Alcohol Use: No    Outpatient Prescriptions Prior to Visit  Medication Sig Dispense Refill  . acetaminophen (TYLENOL) 500 MG tablet Take 1,000 mg by mouth daily as needed for mild pain or moderate pain.    Marland Kitchen amLODipine (NORVASC) 10 MG tablet Take 1 tablet (10 mg total) by mouth daily. 90 tablet 3  . calcium citrate (CALCITRATE - DOSED IN MG ELEMENTAL CALCIUM) 950 MG tablet Take 2 tablets (400 mg of elemental calcium total) by mouth 4 (four) times daily. (Patient not taking: Reported on 06/23/2015) 270 tablet 1  . fluticasone (FLONASE) 50 MCG/ACT nasal spray Place 2 sprays into both nostrils daily.    Marland Kitchen ipratropium (ATROVENT HFA) 17 MCG/ACT inhaler Inhale 2 puffs, every 6 hours as needed rescue (Patient not taking: Reported on 11/29/2014) 1 Inhaler 12  . lisinopril (PRINIVIL,ZESTRIL) 40 MG tablet Take 0.5 tablets (20 mg total) by mouth daily. 90 tablet 1  . triamcinolone ointment (KENALOG) 0.5 % Apply 1 application topically 2 (two) times daily. Apply to hand twice daily 30 g 2   No facility-administered medications prior to visit.    ROS Review of Systems  Constitutional: Negative for fever, chills, fatigue and unexpected weight change.  Eyes: Negative for visual disturbance.  Respiratory: Negative for cough and shortness of breath.   Cardiovascular: Negative for chest pain, palpitations and leg swelling.    Gastrointestinal: Negative for nausea, vomiting, abdominal pain, diarrhea, constipation and blood in stool.  Endocrine: Negative for polydipsia, polyphagia and polyuria.  Musculoskeletal: Negative for myalgias, back pain, arthralgias, gait problem and neck pain.  Skin: Positive for rash.  Allergic/Immunologic: Negative for immunocompromised state.  Hematological: Negative for adenopathy. Does not bruise/bleed easily.  Psychiatric/Behavioral: Negative for suicidal ideas, sleep disturbance and dysphoric mood. The patient is not nervous/anxious.     Objective:  BP 149/84 mmHg  Pulse 105  Temp(Src) 98.6 F (37 C) (Oral)  Resp 16  Ht 5\' 10"  (1.778 m)  Wt 366 lb (166.017 kg)  BMI 52.52 kg/m2  SpO2 99%  BP/Weight 08/15/2015 06/23/2015 11/26/2014  Systolic BP 149 146 132  Diastolic BP 84 86 77  Wt. (Lbs) 366 362 331  BMI 52.52 51.94 47.49   Physical Exam  Constitutional: He appears well-developed and well-nourished. No distress.  HENT:  Head: Normocephalic and atraumatic.  Neck: Normal range of motion. Neck supple.  Cardiovascular: Normal rate, regular rhythm, normal heart sounds and intact distal pulses.   Pulmonary/Chest: Effort normal and breath sounds normal.  Musculoskeletal: He exhibits no edema.  Neurological: He is alert.  Skin: Skin is warm and dry. Rash noted. Rash is macular. There is erythema.  Slightly erythematous macular rash on arms and legs   Psychiatric: He has a normal mood and affect.   Lab Results  Component Value Date   HGBA1C 5.70 08/15/2015    Assessment & Plan:  Problem List Items Addressed This Visit    Eczema (Chronic)   Relevant Medications   fluocinonide cream (LIDEX) 0.05 %    Other Visit Diagnoses    Healthcare maintenance    -  Primary    Relevant Orders    POCT glycosylated hemoglobin (Hb A1C) (Completed)       No orders of the defined types were placed in this encounter.    Follow-up: No Follow-up on file.   Dessa PhiJosalyn Nyan Dufresne  MD

## 2015-08-21 MED FILL — FLUOCINONIDE 0.05% CREAM: 0.05 | 30 days supply | Qty: 60 | Fill #0

## 2015-08-29 ENCOUNTER — Other Ambulatory Visit: Payer: Self-pay | Admitting: Family Medicine

## 2015-09-01 ENCOUNTER — Other Ambulatory Visit: Payer: Self-pay | Admitting: Family Medicine

## 2015-09-01 DIAGNOSIS — L309 Dermatitis, unspecified: Secondary | ICD-10-CM

## 2015-09-03 MED ORDER — FLUOCINONIDE 0.05 % EX CREA: 1.0000 "application " | TOPICAL_CREAM | Freq: Two times a day (BID) | CUTANEOUS | Status: DC

## 2015-09-04 MED FILL — FLUOCINONIDE 0.05% CREAM: 0.05 | 15 days supply | Qty: 60 | Fill #0

## 2015-09-08 MED FILL — AMLODIPINE BESYLATE 10 MG T: 10 | 30 days supply | Qty: 30 | Fill #11

## 2015-09-18 ENCOUNTER — Other Ambulatory Visit: Payer: Self-pay | Admitting: Family Medicine

## 2015-09-18 MED FILL — FLUTICASONE PROP 50 MCG SPR: 50 | 30 days supply | Qty: 16 | Fill #0

## 2015-09-18 MED FILL — LISINOPRIL 40 MG TABLET: 40 | 30 days supply | Qty: 30 | Fill #1

## 2015-10-10 ENCOUNTER — Other Ambulatory Visit: Payer: Self-pay | Admitting: Family Medicine

## 2015-10-10 MED FILL — AMLODIPINE BESYLATE 10 MG T: 10 | 30 days supply | Qty: 30 | Fill #0

## 2015-10-22 MED FILL — LISINOPRIL 40 MG TABLET: 40 | 30 days supply | Qty: 30 | Fill #2

## 2015-10-27 IMAGING — CR DG CHEST 2V
2 series · 2 of 2 positions shown · non-contrast
Comparison: 09/15/2014

CLINICAL DATA: Congestion, productive cough, chest tightness

EXAM:
CHEST  2 VIEW

[chest pa]
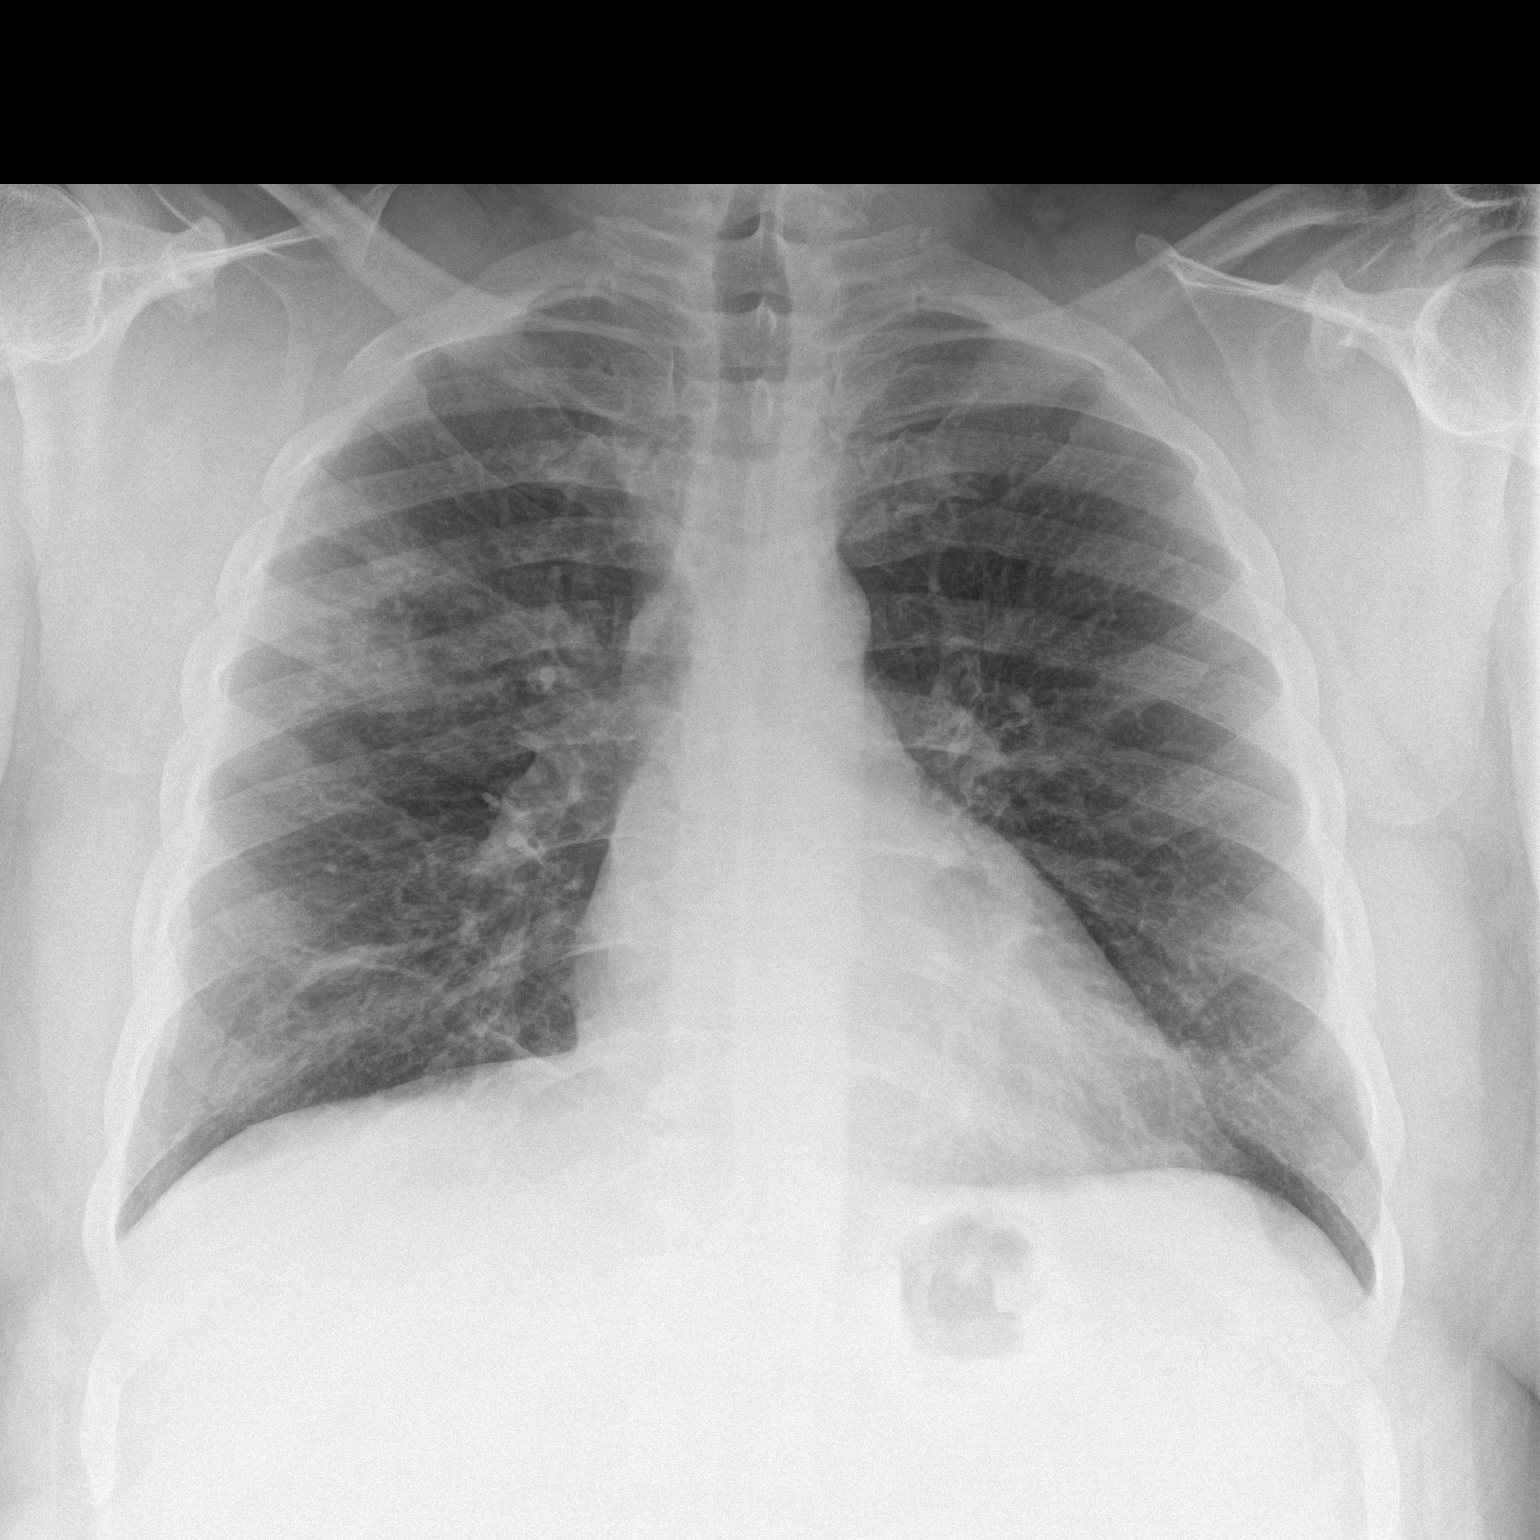

[chest lat]
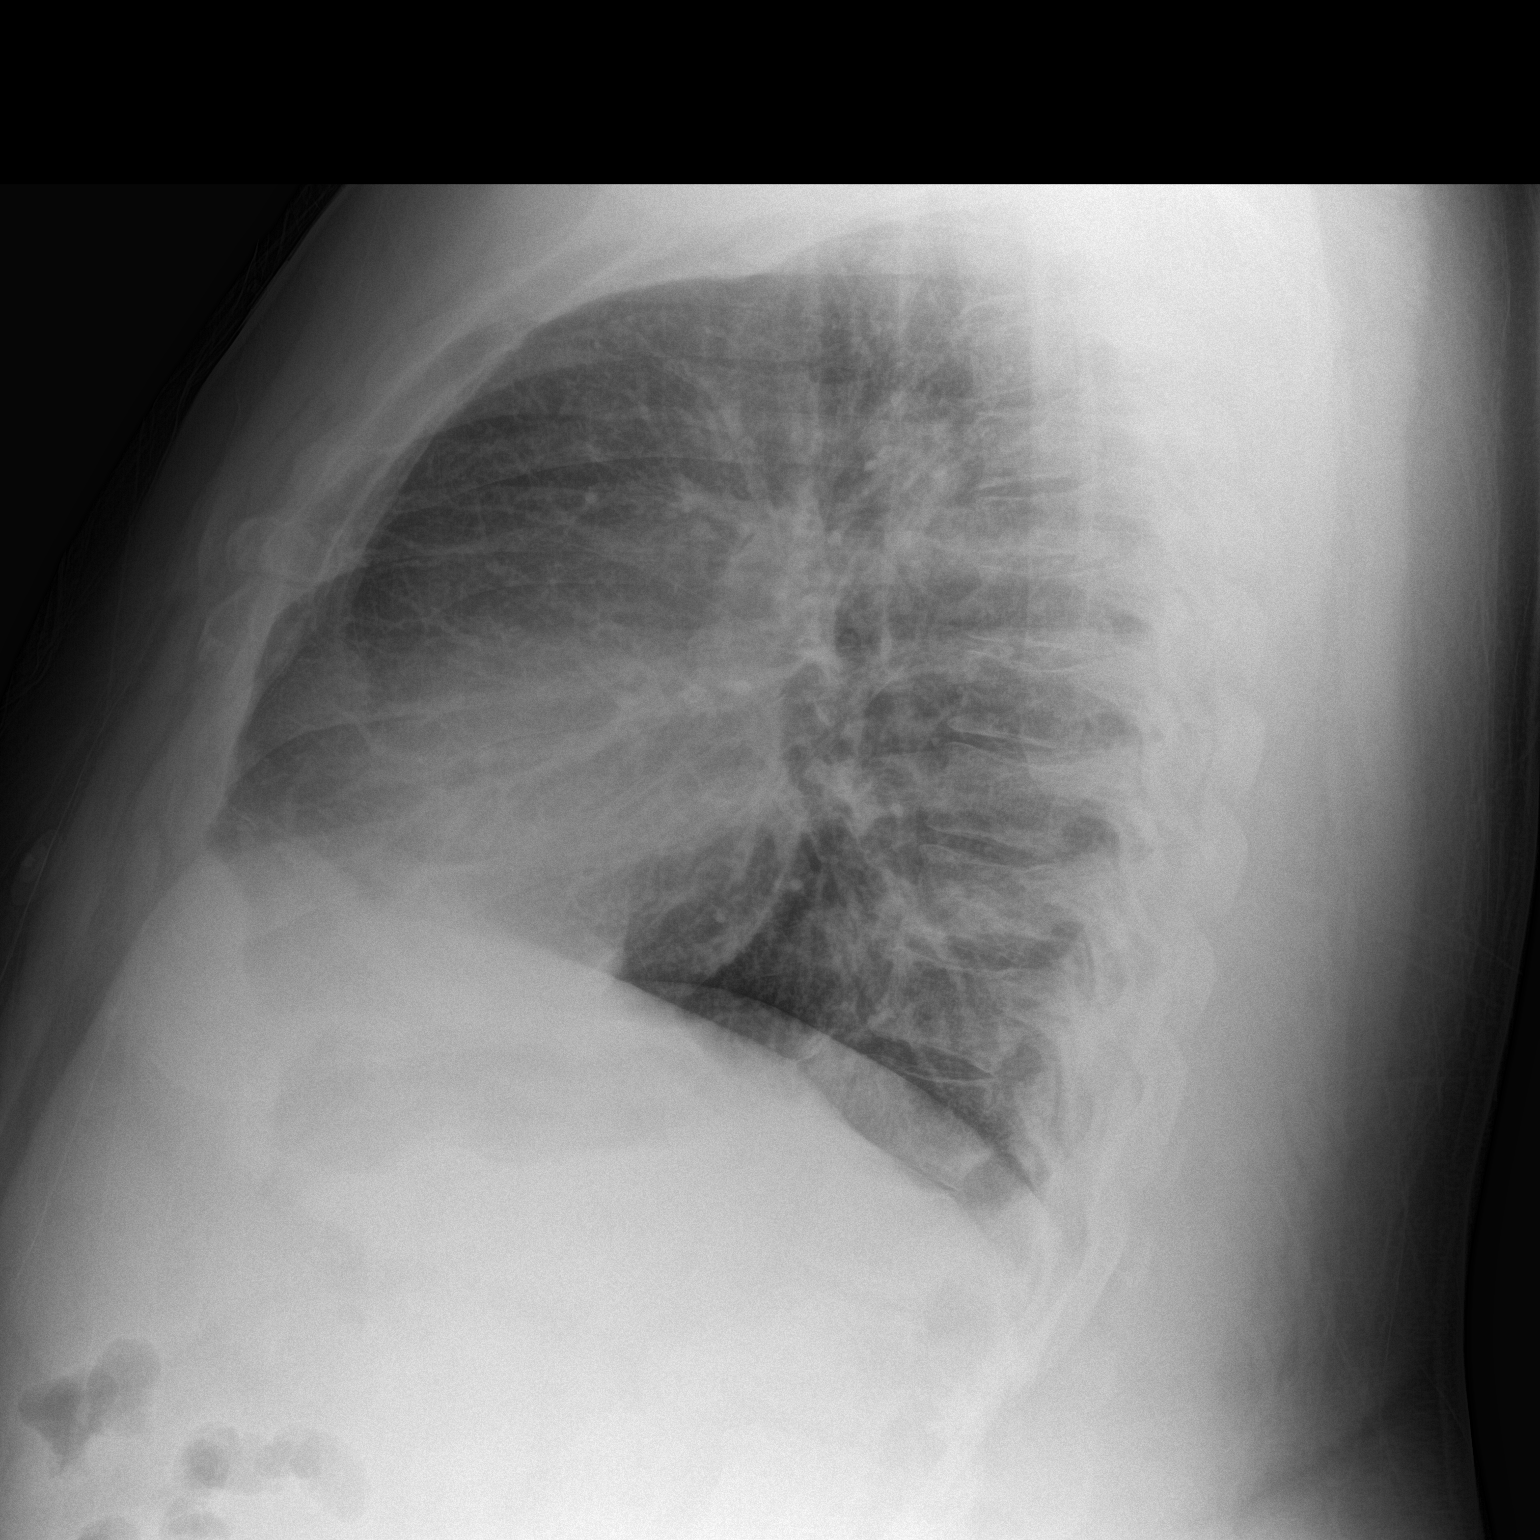

[2 of 2 positions shown; findings below may reference images not displayed]

FINDINGS: Right upper and lower lobe opacities, suspicious for pneumonia.

No pleural effusion or pneumothorax.

The heart is normal in size.

Visualized osseous structures are within normal limits.
IMPRESSION: Right upper and lower lobe pneumonia.

## 2015-11-01 IMAGING — CR DG ABDOMEN ACUTE W/ 1V CHEST
3 series · 3 of 3 positions shown · non-contrast
Comparison: 10/02/2014

CLINICAL DATA: Lightheaded, constipation, shortness of breath

EXAM:
ACUTE ABDOMEN SERIES (ABDOMEN 2 VIEW & CHEST 1 VIEW)

[chest pa]
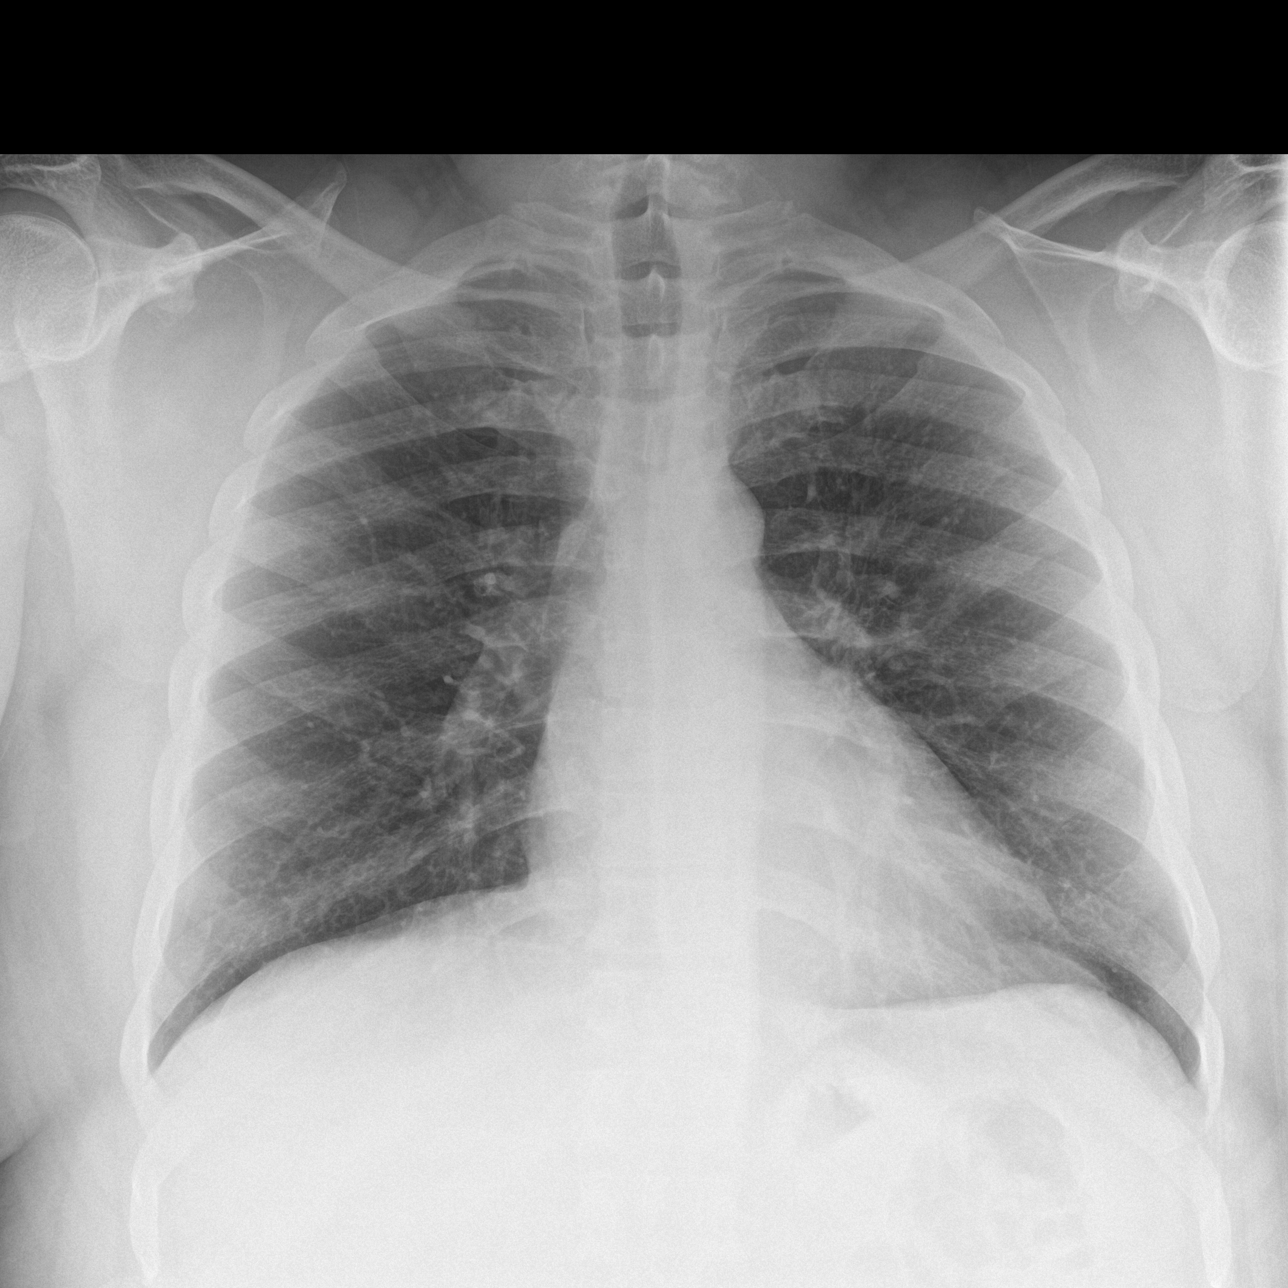

[abdomen erect]
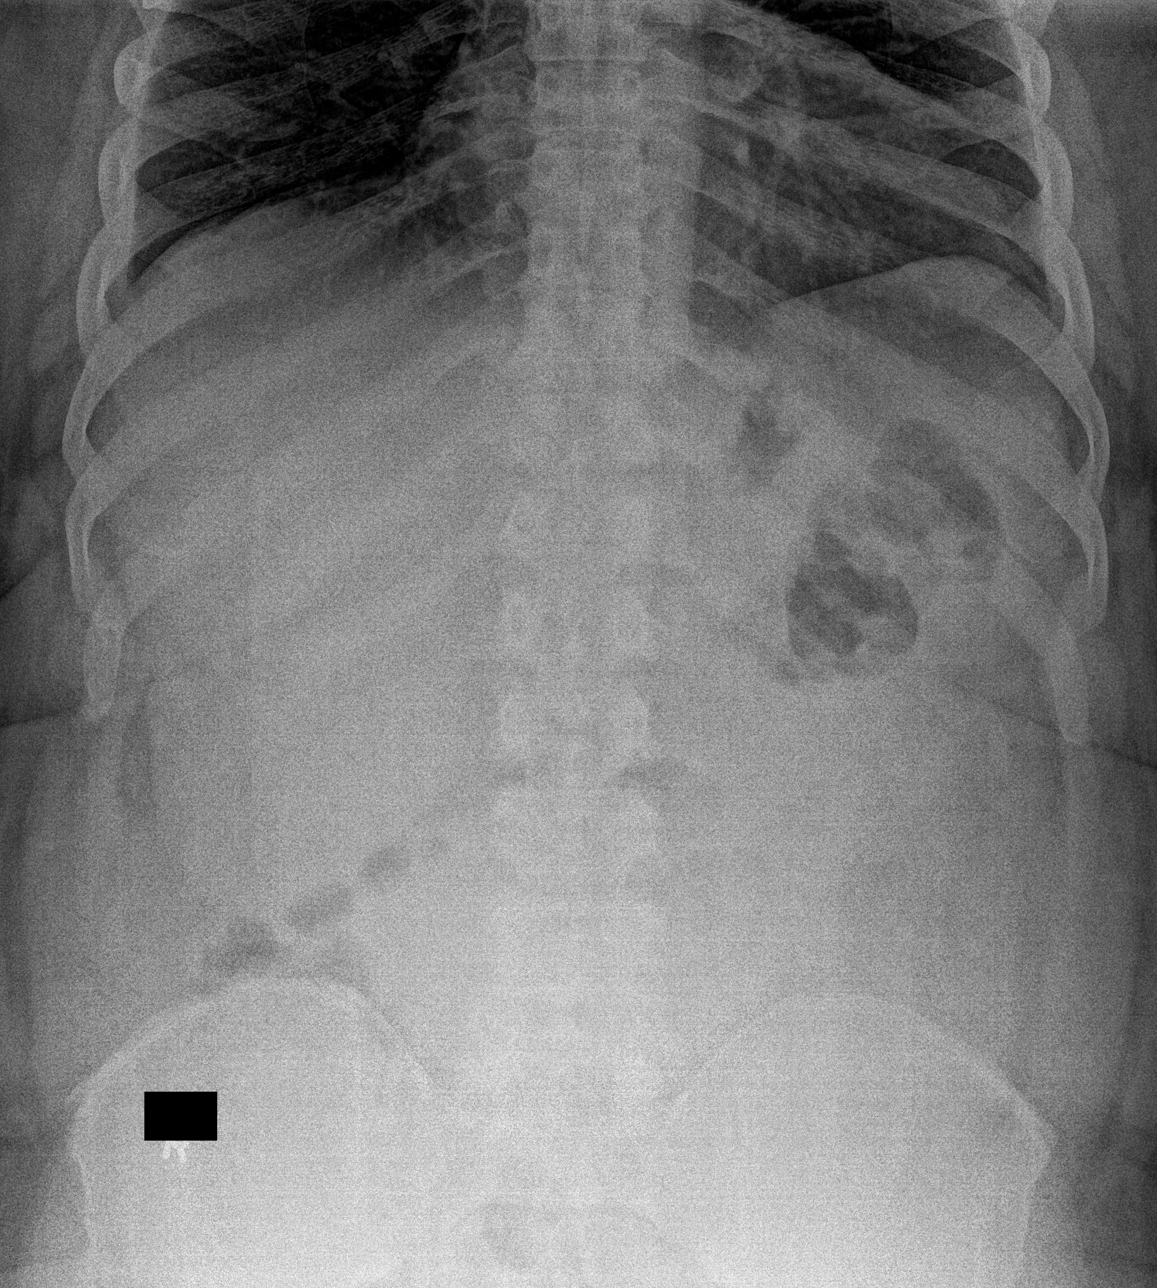

[abdomen supine]
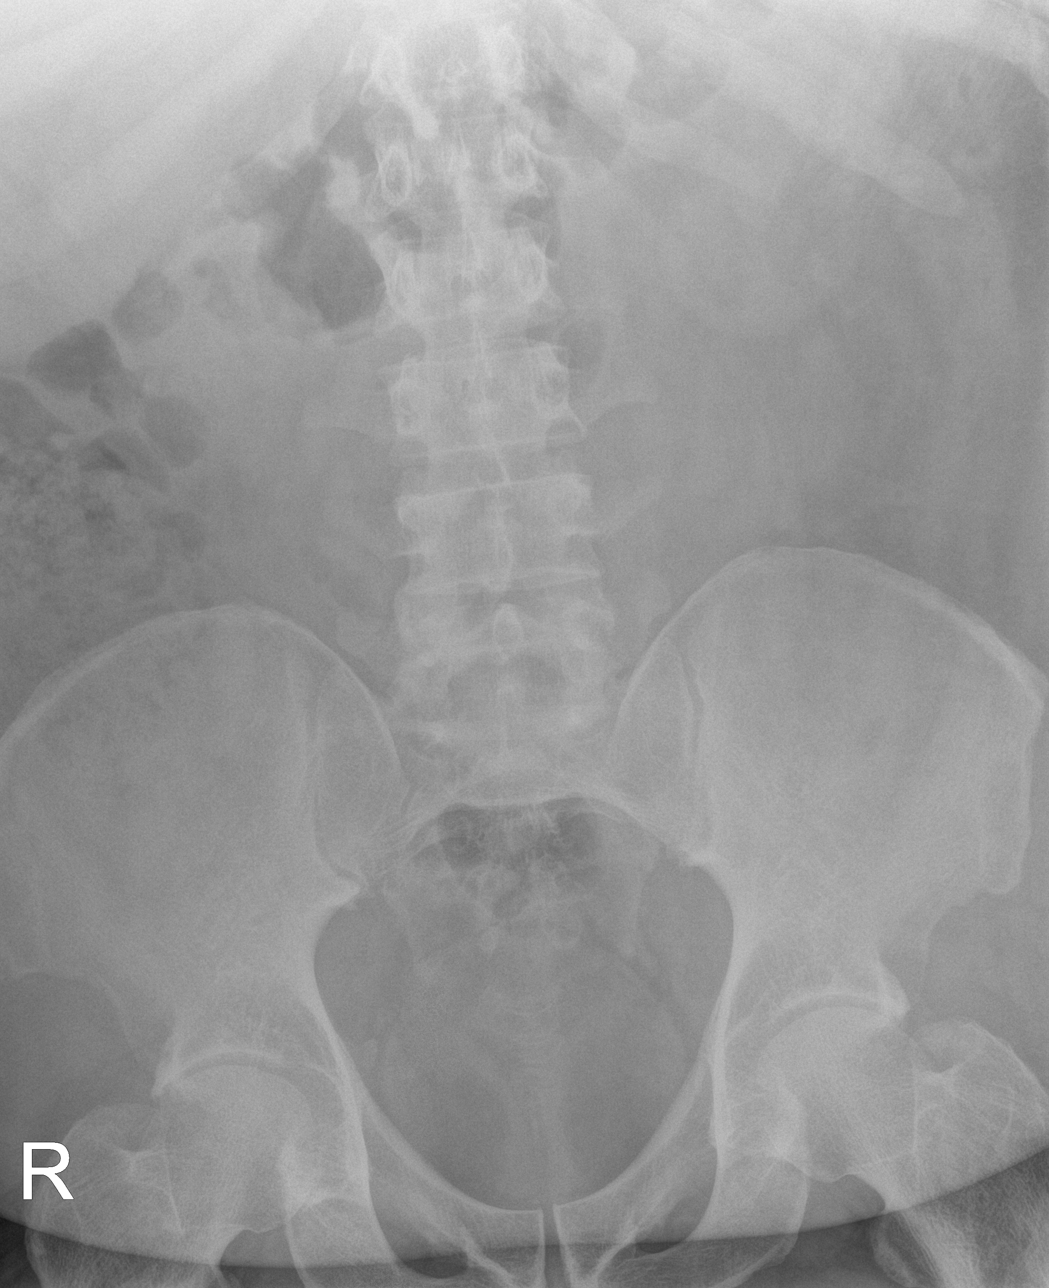

[3 of 3 positions shown; findings below may reference images not displayed]

FINDINGS: There is no evidence of dilated bowel loops or free intraperitoneal
air. No radiopaque calculi or other significant radiographic
abnormality is seen. Heart size and mediastinal contours are within
normal limits. Both lungs are clear.
IMPRESSION: Negative abdominal radiographs.  No acute cardiopulmonary disease.

## 2015-11-17 MED FILL — AMLODIPINE BESYLATE 10 MG T: 10 | 30 days supply | Qty: 30 | Fill #1

## 2015-11-28 MED FILL — LISINOPRIL 40 MG TABLET: 40 | 30 days supply | Qty: 30 | Fill #3

## 2015-12-31 MED FILL — AMLODIPINE BESYLATE 10 MG T: 10 | 30 days supply | Qty: 30 | Fill #2

## 2015-12-31 MED FILL — LISINOPRIL 40 MG TABLET: 40 | 30 days supply | Qty: 30 | Fill #4

## 2016-02-06 MED FILL — LISINOPRIL 40 MG TABLET: 40 | 30 days supply | Qty: 30 | Fill #5

## 2016-02-06 MED FILL — AMLODIPINE BESYLATE 10 MG T: 10 | 30 days supply | Qty: 30 | Fill #3

## 2016-03-15 ENCOUNTER — Other Ambulatory Visit: Payer: Self-pay | Admitting: Family Medicine

## 2016-03-15 DIAGNOSIS — I10 Essential (primary) hypertension: Secondary | ICD-10-CM

## 2016-03-15 MED FILL — AMLODIPINE BESYLATE 10 MG T: 10 | 30 days supply | Qty: 30 | Fill #4

## 2016-03-15 MED FILL — LISINOPRIL 40 MG TABLET: 40 | 30 days supply | Qty: 30 | Fill #0

## 2016-04-19 MED FILL — AMLODIPINE BESYLATE 10 MG T: 10 | 30 days supply | Qty: 30 | Fill #5

## 2016-04-23 ENCOUNTER — Other Ambulatory Visit: Payer: Self-pay | Admitting: Family Medicine

## 2016-04-23 DIAGNOSIS — I10 Essential (primary) hypertension: Secondary | ICD-10-CM

## 2016-05-03 MED FILL — LISINOPRIL 40 MG TABLET: 40 | 30 days supply | Qty: 30 | Fill #0

## 2016-05-31 ENCOUNTER — Other Ambulatory Visit: Payer: Self-pay | Admitting: Family Medicine

## 2016-05-31 MED FILL — AMLODIPINE BESYLATE 10 MG T: 10 | 30 days supply | Qty: 30 | Fill #0

## 2016-06-10 ENCOUNTER — Other Ambulatory Visit: Payer: Self-pay | Admitting: Family Medicine

## 2016-06-10 DIAGNOSIS — I10 Essential (primary) hypertension: Secondary | ICD-10-CM

## 2016-06-21 ENCOUNTER — Other Ambulatory Visit: Payer: Self-pay | Admitting: Family Medicine

## 2016-06-21 DIAGNOSIS — I10 Essential (primary) hypertension: Secondary | ICD-10-CM

## 2016-06-23 MED FILL — LISINOPRIL 40 MG TABLET: 40 | 30 days supply | Qty: 30 | Fill #0

## 2016-07-13 ENCOUNTER — Other Ambulatory Visit: Payer: Self-pay | Admitting: Family Medicine

## 2016-07-13 MED FILL — AMLODIPINE BESYLATE 10 MG T: 10 | 30 days supply | Qty: 30 | Fill #0

## 2016-08-04 MED FILL — LISINOPRIL 40 MG TABLET: 40 | 30 days supply | Qty: 30 | Fill #1

## 2016-08-27 MED FILL — AMLODIPINE BESYLATE 10 MG T: 10 | 30 days supply | Qty: 30 | Fill #1

## 2016-09-10 ENCOUNTER — Other Ambulatory Visit: Payer: Self-pay | Admitting: Family Medicine

## 2016-09-10 DIAGNOSIS — I10 Essential (primary) hypertension: Secondary | ICD-10-CM

## 2016-09-13 MED FILL — LISINOPRIL 40 MG TABLET: 40 | 30 days supply | Qty: 30 | Fill #0

## 2016-09-30 ENCOUNTER — Ambulatory Visit: Payer: BC Managed Care – PPO | Attending: Family Medicine | Admitting: Physician Assistant

## 2016-09-30 ENCOUNTER — Encounter: Payer: Self-pay | Admitting: Physician Assistant

## 2016-09-30 VITALS — BP 172/92 | HR 110 | Temp 98.7°F | Resp 16 | Wt 372.0 lb

## 2016-09-30 DIAGNOSIS — Z833 Family history of diabetes mellitus: Secondary | ICD-10-CM | POA: Insufficient documentation

## 2016-09-30 DIAGNOSIS — I10 Essential (primary) hypertension: Secondary | ICD-10-CM | POA: Insufficient documentation

## 2016-09-30 DIAGNOSIS — R0981 Nasal congestion: Secondary | ICD-10-CM | POA: Diagnosis not present

## 2016-09-30 DIAGNOSIS — R195 Other fecal abnormalities: Secondary | ICD-10-CM

## 2016-09-30 DIAGNOSIS — E669 Obesity, unspecified: Secondary | ICD-10-CM | POA: Insufficient documentation

## 2016-09-30 LAB — COMPREHENSIVE METABOLIC PANEL
ALBUMIN: 3.9 g/dL (ref 3.6–5.1)
ALT: 23 U/L (ref 9–46)
AST: 21 U/L (ref 10–40)
Alkaline Phosphatase: 79 U/L (ref 40–115)
BILIRUBIN TOTAL: 0.3 mg/dL (ref 0.2–1.2)
BUN: 15 mg/dL (ref 7–25)
CALCIUM: 7.5 mg/dL — AB (ref 8.6–10.3)
CHLORIDE: 103 mmol/L (ref 98–110)
CO2: 24 mmol/L (ref 20–31)
Creat: 0.99 mg/dL (ref 0.60–1.35)
Glucose, Bld: 148 mg/dL — ABNORMAL HIGH (ref 65–99)
Potassium: 3.9 mmol/L (ref 3.5–5.3)
Sodium: 139 mmol/L (ref 135–146)
Total Protein: 7.5 g/dL (ref 6.1–8.1)

## 2016-09-30 LAB — TSH: TSH: 2.19 mIU/L (ref 0.40–4.50)

## 2016-09-30 MED ORDER — AMLODIPINE BESYLATE 10 MG PO TABS: 10.0000 mg | ORAL_TABLET | Freq: Every day | ORAL | 0 refills | Status: DC

## 2016-09-30 MED ORDER — LISINOPRIL 40 MG PO TABS: 40.0000 mg | ORAL_TABLET | Freq: Every day | ORAL | 0 refills | Status: DC

## 2016-09-30 MED ORDER — FLUTICASONE PROPIONATE 50 MCG/ACT NA SUSP: NASAL | 2 refills | Status: DC

## 2016-09-30 NOTE — Patient Instructions (Signed)
Check Blood pressure 5 times per week and record and bring the recordings and your BP cuff to your next office visit.

## 2016-09-30 NOTE — Progress Notes (Signed)
Peter Hahn, is a 36 y.o. male  GNF:[id number]  QIO:[id number]  DOB - 02/22/1981  Subjective:  Chief Complaint and HPI: Peter Hahn is a 36 y.o. male here today for nasal congestion and loose BM the last 2 days.  His girlfriend went to the doctor a couple of weeks ago and was told she had the flu.  She was not prescribed Tamiflu.  On and off for the last week or so, he has had vague symptoms of illness including on and off body aches, intermittent scratchy throat, runny nose a few times  and for the last 2 days 2 loose BMs per day and sinus congestion.    He denies f/c or cough.  No vomiting or nausea.  No abdominal pain.  No OTC meds  He has been taking his BP meds.  He took his BP at home yesterday and it was 140/90, and he took it the day before that and it was 133/80.  Today, he feels his BP is up because he is anxious about being here.  He denies CP/HA/Dizziness.  Hasnt had labs in about 1 year.  Social History: has a girlfriend Family history:  Parents both with DM, htn, paternal gm-htn  ROS:   Constitutional:  No f/c, No night sweats, No unexplained weight loss. EENT:  No vision changes, No blurry vision, No hearing changes. +scratchy but not ST, +nasal congestion and some intermittent runny nose.  No sinus pain. Respiratory: No cough, No SOB Cardiac: No CP, no palpitations GI:  No abd pain, No N/V, + loose BM. GU: No Urinary s/sx Musculoskeletal: No joint pain Neuro: No headache, no dizziness, no motor weakness.  Skin: No rash Endocrine:  No polydipsia. No polyuria.  Psych: Denies SI/HI  No problems updated.  ALLERGIES: Allergies  Allergen Reactions  . Diltiazem     Body aches, chills  . Pollen Extract     Sneezing, headaches    PAST MEDICAL HISTORY: Past Medical History:  Diagnosis Date  . Allergy    since birth   . Hypertension 2006   first told his BP was high, never treated   . Obesity   . Pseudohypoparathyroidism     MEDICATIONS AT HOME: Prior  to Admission medications   Medication Sig Start Date End Date Taking? Authorizing Provider  acetaminophen (TYLENOL) 500 MG tablet Take 1,000 mg by mouth daily as needed for mild pain or moderate pain.    Historical Provider, MD  amLODipine (NORVASC) 10 MG tablet Take 1 tablet (10 mg total) by mouth daily. 09/30/16   Anders Simmonds, PA-C  calcium citrate (CALCITRATE - DOSED IN MG ELEMENTAL CALCIUM) 950 MG tablet Take 2 tablets (400 mg of elemental calcium total) by mouth 4 (four) times daily. 11/26/14   Josalyn Funches, MD  fluocinonide cream (LIDEX) 0.05 % Apply 1 application topically 2 (two) times daily. Patient not taking: Reported on 09/30/2016 09/03/15   Dessa Phi, MD  fluticasone (FLONASE) 50 MCG/ACT nasal spray PLACE 2 SPRAYS INTO BOTH NOSTRILS DAILY. 09/30/16   Anders Simmonds, PA-C  ipratropium (ATROVENT HFA) 17 MCG/ACT inhaler Inhale 2 puffs, every 6 hours as needed rescue Patient not taking: Reported on 09/30/2016 10/28/14   Waymon Budge, MD  lisinopril (PRINIVIL,ZESTRIL) 40 MG tablet Take 1 tablet (40 mg total) by mouth daily. 09/30/16   Anders Simmonds, PA-C     Objective:  EXAM:   Vitals:   09/30/16 1352  BP: (!) 172/92  Pulse: (!) 110  Resp: 16  Temp: 98.7 F (37.1 C)  TempSrc: Oral  SpO2: 97%  Weight: (!) 372 lb (168.7 kg)    General appearance : A&OX3. NAD. Non-toxic-appearing, morbidly obese HEENT: Atraumatic and Normocephalic.  PERRLA. EOM intact.  TM clear B.  B turbinates congested. Mouth-MMM, post pharynx WNL w/o erythema, No PND. Neck: supple, no JVD. No cervical lymphadenopathy. No thyromegaly Chest/Lungs:  Breathing-non-labored, Good air entry bilaterally, breath sounds normal without rales, rhonchi, or wheezing  CVS: S1 S2 regular, no murmurs, gallops, rubs  Abdomen: Bowel sounds present, Non tender and not distended with no gaurding, rigidity or rebound. Extremities: Bilateral Lower Ext shows no edema, both legs are warm to touch with = pulse  throughout Neurology:  CN II-XII grossly intact, Non focal.   Psych:  TP linear. J/I WNL. Normal speech. Appropriate eye contact and affect.  Skin:  No Rash  Data Review Lab Results  Component Value Date   HGBA1C 5.70 08/15/2015   HGBA1C 6.0 08/26/2014     Assessment & Plan   1. Loose stools With recent flu exposure but definitely does not have fulminant flu and was exposed more than 2 weeks ago, so no need for prophylactic Tamiflu. - Comprehensive metabolic panel - CBC with Differential/Platelet - TSH  2. Essential hypertension-  Uncontrolled in office but home numbers almost at control.  Check BP at home 5 times/week and record and bring recordings and BP cuff to next visit.  No labs I >1 year.  - lisinopril (PRINIVIL,ZESTRIL) 40 MG tablet; Take 1 tablet (40 mg total) by mouth daily.  Dispense: 90 tablet; Refill: 0 - amLODipine (NORVASC) 10 MG tablet; Take 1 tablet (10 mg total) by mouth daily.  Dispense: 90 tablet; Refill: 0 - Comprehensive metabolic panel - CBC with Differential/Platelet - TSH  3. Congestion of nasal sinus - fluticasone (FLONASE) 50 MCG/ACT nasal spray; PLACE 2 SPRAYS INTO BOTH NOSTRILS DAILY.  Dispense: 16 g; Refill: 2  Patient have been counseled extensively about nutrition and exercise  Return in about 4 weeks (around 10/28/2016) for Dr Truett MainlandFunches-BP f/up.  The patient was given clear instructions to go to ER or return to medical center if symptoms don't improve, worsen or new problems develop. The patient verbalized understanding. The patient was told to call to get lab results if they haven't heard anything in the next week.     Georgian CoAngela Cason Luffman, PA-C [facility] [place], [place] [phone]   09/30/2016, 2:11 PM

## 2016-10-01 LAB — CBC WITH DIFFERENTIAL/PLATELET
BASOS PCT: 0 %
Basophils Absolute: 0 cells/uL (ref 0–200)
EOS ABS: 111 {cells}/uL (ref 15–500)
Eosinophils Relative: 1 %
HCT: 42.7 % (ref 38.5–50.0)
Hemoglobin: 14.3 g/dL (ref 13.2–17.1)
Lymphocytes Relative: 17 %
Lymphs Abs: 1887 cells/uL (ref 850–3900)
MCH: 31.4 pg (ref 27.0–33.0)
MCHC: 33.5 g/dL (ref 32.0–36.0)
MCV: 93.6 fL (ref 80.0–100.0)
MONO ABS: 444 {cells}/uL (ref 200–950)
MONOS PCT: 4 %
MPV: 9.6 fL (ref 7.5–12.5)
NEUTROS ABS: 8658 {cells}/uL — AB (ref 1500–7800)
Neutrophils Relative %: 78 %
Platelets: 288 10*3/uL (ref 140–400)
RBC: 4.56 MIL/uL (ref 4.20–5.80)
RDW: 15.6 % — ABNORMAL HIGH (ref 11.0–15.0)
WBC: 11.1 10*3/uL — ABNORMAL HIGH (ref 3.8–10.8)

## 2016-10-03 ENCOUNTER — Encounter (HOSPITAL_COMMUNITY): Payer: Self-pay | Admitting: Oncology

## 2016-10-03 ENCOUNTER — Emergency Department (HOSPITAL_COMMUNITY)
Admission: EM | Admit: 2016-10-03 | Discharge: 2016-10-03 | Disposition: A | Discharge status: Home / Self Care | Payer: BC Managed Care – PPO | Attending: Emergency Medicine | Admitting: Emergency Medicine

## 2016-10-03 DIAGNOSIS — Z79899 Other long term (current) drug therapy: Secondary | ICD-10-CM | POA: Diagnosis not present

## 2016-10-03 DIAGNOSIS — I10 Essential (primary) hypertension: Secondary | ICD-10-CM | POA: Diagnosis not present

## 2016-10-03 DIAGNOSIS — R519 Headache, unspecified: Secondary | ICD-10-CM

## 2016-10-03 DIAGNOSIS — R51 Headache: Secondary | ICD-10-CM | POA: Insufficient documentation

## 2016-10-03 MED ORDER — ACETAMINOPHEN 325 MG PO TABS
650.0000 mg | ORAL_TABLET | Freq: Once | ORAL | Status: AC
  Administered 2016-10-03: 650 mg via ORAL
  Filled 2016-10-03: qty 2

## 2016-10-03 MED ORDER — CETIRIZINE HCL 10 MG PO TABS: 10.0000 mg | ORAL_TABLET | Freq: Every day | ORAL | 1 refills | Status: DC

## 2016-10-03 MED ORDER — METOCLOPRAMIDE HCL 5 MG/ML IJ SOLN
10.0000 mg | Freq: Once | INTRAMUSCULAR | Status: AC
Start: 2016-10-03 — End: 2016-10-03
  Administered 2016-10-03: 10 mg via INTRAVENOUS
  Filled 2016-10-03: qty 2

## 2016-10-03 MED ORDER — DIPHENHYDRAMINE HCL 50 MG/ML IJ SOLN
25.0000 mg | Freq: Once | INTRAMUSCULAR | Status: AC
  Administered 2016-10-03: 25 mg via INTRAVENOUS
  Filled 2016-10-03: qty 1

## 2016-10-03 MED ORDER — ACETAMINOPHEN 325 MG PO TABS: 650.0000 mg | ORAL_TABLET | Freq: Four times a day (QID) | ORAL | 0 refills | Status: DC | PRN

## 2016-10-03 MED ORDER — DEXAMETHASONE SODIUM PHOSPHATE 10 MG/ML IJ SOLN
10.0000 mg | Freq: Once | INTRAMUSCULAR | Status: AC
  Administered 2016-10-03: 10 mg via INTRAVENOUS
  Filled 2016-10-03: qty 1

## 2016-10-03 MED ORDER — SODIUM CHLORIDE 0.9 % IV BOLUS (SEPSIS)
1000.0000 mL | Freq: Once | INTRAVENOUS | Status: AC
  Administered 2016-10-03: 1000 mL via INTRAVENOUS

## 2016-10-03 NOTE — ED Triage Notes (Signed)
Pt states he has had an intermittent frontal HA he describes as pressure.  Pressure is also present under both eyes.  This started last Wednesday.  Pt went to his PCP for this as well.  Pt also c/o diarrhea since Thursday, states he had his first solid BM this AM.  Pt rates pain 3/10.

## 2016-10-03 NOTE — ED Provider Notes (Signed)
[facility]-EMERGENCY DEPT Provider Note   CSN: [id number] Arrival date & time: 10/03/16  4098     History   Chief Complaint Chief Complaint  Patient presents with  . Headache    HPI Peter Hahn is a 36 y.o. male.  Peter Hahn is a 36 y.o. Male who presents to the ED complaining of a frontal sinus headache intermittently since Wednesday. Patient complains of pain to his maxillary sinuses, and to his forehead intermittently for 5 days. He reports having flulike symptoms last week which are resolving. He denies any fevers or chills. He reports seeing his primary care doctor this week he started him on Flonase. He reports nasal congestion and post nasal drip. Patient also reports having some diarrhea early in the week which resolved. He reports a bowel movement this morning which was formed. Patient denies sudden onset headache. He denies fevers, lightheadedness, dizziness, changes to his vision, double vision, neck pain, neck stiffness, abdominal pain, nausea, vomiting, chest pain, coughing, shortness of breath, numbness, tingling, weakness or falls.   The history is provided by the patient and medical records. No language interpreter was used.  Headache   Pertinent negatives include no fever, no palpitations, no shortness of breath, no nausea and no vomiting.    Past Medical History:  Diagnosis Date  . Allergy    since birth   . Hypertension 2006   first told his BP was high, never treated   . Obesity   . Pseudohypoparathyroidism     Patient Active Problem List   Diagnosis Date Noted  . Eczema 08/15/2015  . 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 Generalized anxiety disorder 10/04/2014  . HTN (hypertension) 08/26/2014  . Hypocalcemia 08/26/2014  . Morbid obesity (HCC) 08/26/2014  . Allergic rhinitis 08/26/2014  . Pseudohypoparathyroidism     History reviewed. No pertinent surgical history.     Home Medications    Prior to Admission medications   Medication Sig Start Date End  Date Taking? Authorizing Provider  acetaminophen (TYLENOL) 325 MG tablet Take 2 tablets (650 mg total) by mouth every 6 (six) hours as needed. 10/03/16   Everlene Farrier, PA-C  amLODipine (NORVASC) 10 MG tablet Take 1 tablet (10 mg total) by mouth daily. 09/30/16   Anders Simmonds, PA-C  calcium citrate (CALCITRATE - DOSED IN MG ELEMENTAL CALCIUM) 950 MG tablet Take 2 tablets (400 mg of elemental calcium total) by mouth 4 (four) times daily. 11/26/14   Dessa Phi, MD  cetirizine (ZYRTEC ALLERGY) 10 MG tablet Take 1 tablet (10 mg total) by mouth daily. 10/03/16   Everlene Farrier, PA-C  fluocinonide cream (LIDEX) 0.05 % Apply 1 application topically 2 (two) times daily. Patient not taking: Reported on 09/30/2016 09/03/15   Dessa Phi, MD  fluticasone (FLONASE) 50 MCG/ACT nasal spray PLACE 2 SPRAYS INTO BOTH NOSTRILS DAILY. 09/30/16   Anders Simmonds, PA-C  ipratropium (ATROVENT HFA) 17 MCG/ACT inhaler Inhale 2 puffs, every 6 hours as needed rescue Patient not taking: Reported on 09/30/2016 10/28/14   Waymon Budge, MD  lisinopril (PRINIVIL,ZESTRIL) 40 MG tablet Take 1 tablet (40 mg total) by mouth daily. 09/30/16   Anders Simmonds, PA-C    Family History Family History  Problem Relation Age of Onset  . Diabetes Mother   . Diabetes Father   . Hypertension Father   . Hypertension Paternal Grandmother     Social History Social History  Substance Use Topics  . Smoking status: Never Smoker  . Smokeless tobacco: Never Used  .  Alcohol use No     Allergies   Diltiazem and Pollen extract   Review of Systems Review of Systems  Constitutional: Negative for appetite change, chills and fever.  HENT: Positive for congestion, postnasal drip, rhinorrhea, sinus pain and sinus pressure. Negative for sore throat and trouble swallowing.   Eyes: Negative for pain and visual disturbance.  Respiratory: Negative for cough, shortness of breath and wheezing.   Cardiovascular: Negative for chest pain and  palpitations.  Gastrointestinal: Negative for abdominal pain, diarrhea, nausea and vomiting.  Genitourinary: Negative for dysuria.  Musculoskeletal: Negative for back pain, neck pain and neck stiffness.  Skin: Negative for rash.  Neurological: Positive for headaches. Negative for dizziness, syncope, weakness, light-headedness and numbness.     Physical Exam Updated Vital Signs BP 146/82 (BP Location: Right Arm)   Pulse 109   Temp 99.6 F (37.6 C) (Oral)   Resp 20   Ht 5\' 10"  (1.778 m)   Wt (!) 168.3 kg   SpO2 98%   BMI 53.23 kg/m   Physical Exam  Constitutional: He is oriented to person, place, and time. He appears well-developed and well-nourished. No distress.  Nontoxic appearing.  HENT:  Head: Normocephalic and atraumatic.  Right Ear: External ear normal.  Left Ear: External ear normal.  Mouth/Throat: Oropharynx is clear and moist.  No temporal edema or tenderness. Rhinorrhea present with boggy nasal turbinates bilaterally. Mild clear middle ear effusion noted bilaterally. No TM erythema or loss of landmarks. Throat is clear. Frontal and maxillary sinus tenderness to palpation.  Eyes: Conjunctivae and EOM are normal. Pupils are equal, round, and reactive to light. Right eye exhibits no discharge. Left eye exhibits no discharge.  Neck: Normal range of motion. Neck supple. No JVD present. No tracheal deviation present.  Cardiovascular: Normal rate, regular rhythm, normal heart sounds and intact distal pulses.  Exam reveals no gallop and no friction rub.   No murmur heard. HR is 88  Pulmonary/Chest: Effort normal and breath sounds normal. No stridor. No respiratory distress. He has no wheezes. He has no rales.  Abdominal: Soft. There is no tenderness.  Musculoskeletal: Normal range of motion. He exhibits no edema or tenderness.  Lymphadenopathy:    He has no cervical adenopathy.  Neurological: He is alert and oriented to person, place, and time. No cranial nerve deficit  or sensory deficit. He exhibits normal muscle tone. Coordination normal.  Patient is alert and oriented 3. Cranial nerves are intact. Speech is clear and coherent. No pronator drift. Finger-to-nose intact bilaterally. Sensation is intact in his bilateral upper and lower extremities. EOMs are intact. Vision is grossly intact. Normal gait.   Skin: Skin is warm and dry. Capillary refill takes less than 2 seconds. No rash noted. He is not diaphoretic. No erythema. No pallor.  Psychiatric: He has a normal mood and affect. His behavior is normal.  Nursing note and vitals reviewed.    ED Treatments / Results  Labs (all labs ordered are listed, but only abnormal results are displayed) Labs Reviewed - No data to display  EKG  EKG Interpretation None       Radiology No results found.  Procedures Procedures (including critical care time)  Medications Ordered in ED Medications  sodium chloride 0.9 % bolus 1,000 mL (1,000 mLs Intravenous New Bag/Given 10/03/16 0837)  metoCLOPramide (REGLAN) injection 10 mg (10 mg Intravenous Given 10/03/16 0837)  diphenhydrAMINE (BENADRYL) injection 25 mg (25 mg Intravenous Given 10/03/16 0843)  dexamethasone (DECADRON) injection 10 mg (10 mg  Intravenous Given 10/03/16 0841)  acetaminophen (TYLENOL) tablet 650 mg (650 mg Oral Given 10/03/16 4098)     Initial Impression / Assessment and Plan / ED Course  I have reviewed the triage vital signs and the nursing notes.  Pertinent labs & imaging results that were available during my care of the patient were reviewed by me and considered in my medical decision making (see chart for details).    This  is a 36 y.o. Male who presents to the ED complaining of a frontal sinus headache intermittently since Wednesday. Patient complains of pain to his maxillary sinuses, and to his forehead intermittently for 5 days. He reports having flulike symptoms last week which are resolving. He denies any fevers or chills. He reports  seeing his primary care doctor this week he started him on Flonase. He reports nasal congestion and post nasal drip. Patient also reports having some diarrhea early in the week which resolved. He reports a bowel movement this morning which was formed.  On exam the patient is afebrile nontoxic appearing. Pt HA treated and resolved while in ED.  Presentation is like pts typical HA and non concerning for [facility], ICH, Meningitis, or temporal arteritis. Pt is afebrile with no focal neuro deficits, nuchal rigidity, or change in vision. We'll have the patient continue to use Flonase. He is only use this now for 3 days. Also start Zyrtec and Tylenol as needed for headache. I discussed return precautions. I advised the patient to follow-up with their primary care provider this week. I advised the patient to return to the emergency department with new or worsening symptoms or new concerns. The patient verbalized understanding and agreement with plan.      Final Clinical Impressions(s) / ED Diagnoses   Final diagnoses:  Sinus headache    New Prescriptions New Prescriptions   ACETAMINOPHEN (TYLENOL) 325 MG TABLET    Take 2 tablets (650 mg total) by mouth every 6 (six) hours as needed.   CETIRIZINE (ZYRTEC ALLERGY) 10 MG TABLET    Take 1 tablet (10 mg total) by mouth daily.     Everlene Farrier, PA-C 10/03/16 1191    Shaune Pollack, MD 10/03/16 [phone]

## 2016-10-04 ENCOUNTER — Telehealth: Payer: BC Managed Care – PPO | Admitting: Family

## 2016-10-04 ENCOUNTER — Telehealth: Payer: Self-pay | Admitting: Family Medicine

## 2016-10-04 ENCOUNTER — Other Ambulatory Visit: Payer: Self-pay | Admitting: Physician Assistant

## 2016-10-04 DIAGNOSIS — J329 Chronic sinusitis, unspecified: Secondary | ICD-10-CM

## 2016-10-04 DIAGNOSIS — I10 Essential (primary) hypertension: Secondary | ICD-10-CM

## 2016-10-04 DIAGNOSIS — B9689 Other specified bacterial agents as the cause of diseases classified elsewhere: Secondary | ICD-10-CM

## 2016-10-04 MED ORDER — AMOXICILLIN-POT CLAVULANATE 875-125 MG PO TABS: 1.0000 | ORAL_TABLET | Freq: Two times a day (BID) | ORAL | 0 refills | Status: AC

## 2016-10-04 NOTE — Telephone Encounter (Signed)
Patient was prescribed generic zyrtec and would like to know if it can be taken along with flonase.  Please follow up

## 2016-10-04 NOTE — Progress Notes (Signed)
We are sorry that you are not feeling well.  Here is how we plan to help!  *Based on your chart review, this is day 6 for total symptoms worsening, so we will treat as below:   Based on what you have shared with me it looks like you have sinusitis.  Sinusitis is inflammation and infection in the sinus cavities of the head.  Based on your presentation I believe you most likely have Acute Bacterial Sinusitis.  This is an infection caused by bacteria and is treated with antibiotics. I have prescribed Augmentin 875mg /125mg  one tablet twice daily with food, for 7 days. You may use an oral decongestant such as Mucinex D or if you have glaucoma or high blood pressure use plain Mucinex. Saline nasal spray help and can safely be used as often as needed for congestion.  If you develop worsening sinus pain, fever or notice severe headache and vision changes, or if symptoms are not better after completion of antibiotic, please schedule an appointment with a health care provider.    Sinus infections are not as easily transmitted as other respiratory infection, however we still recommend that you avoid close contact with loved ones, especially the very young and elderly.  Remember to wash your hands thoroughly throughout the day as this is the number one way to prevent the spread of infection!  Home Care:  Only take medications as instructed by your medical team.  Complete the entire course of an antibiotic.  Do not take these medications with alcohol.  A steam or ultrasonic humidifier can help congestion.  You can place a towel over your head and breathe in the steam from hot water coming from a faucet.  Avoid close contacts especially the very young and the elderly.  Cover your mouth when you cough or sneeze.  Always remember to wash your hands.  Get Help Right Away If:  You develop worsening fever or sinus pain.  You develop a severe head ache or visual changes.  Your symptoms persist after you  have completed your treatment plan.  Make sure you  Understand these instructions.  Will watch your condition.  Will get help right away if you are not doing well or get worse.  Your e-visit answers were reviewed by a board certified advanced clinical practitioner to complete your personal care plan.  Depending on the condition, your plan could have included both over the counter or prescription medications.  If there is a problem please reply  once you have received a response from your provider.  Your safety is important to us.  If you have drug allergies check your prescription carefully.    You can use MyChart to ask questions about today's visit, request a non-urgent call back, or ask for a work or school excuse for 24 hours related to this e-Visit. If it has been greater than 24 hours you will need to follow up with your provider, or enter a new e-Visit to address those concerns.  You will get an e-mail in the next two days asking about your experience.  I hope that your e-visit has been valuable and will speed your recovery. Thank you for using e-visits.

## 2016-10-04 NOTE — Telephone Encounter (Signed)
Medication refill request.

## 2016-10-06 ENCOUNTER — Telehealth: Payer: Self-pay

## 2016-10-06 ENCOUNTER — Encounter: Payer: Self-pay | Admitting: Physician Assistant

## 2016-10-06 NOTE — Telephone Encounter (Signed)
Pt was called and informed that it is ok to take both medications.

## 2016-10-06 NOTE — Telephone Encounter (Signed)
Contacted pt to go over lab results pt is aware of results. Also pt stated he was prescribed antibiotics for a sinus headache and he is feeling better and he started to have diarrhea again. Pt stated he had diarrhea only one time today. Pt concern is does he continue to take the antibiotics even though he is feeling better and what to do about the diarrhea. I informed pt that even though he is starting to feel better to continue the antibiotics and to  drink plenty of fluids because diarrhea is a side effect of the antibiotic and if the diarrhea doesn't go away when he is done with the antibotics to give us a call to schedule an appointment. Pt states he understands and doesn't have any further questions or concerns

## 2016-10-06 NOTE — Telephone Encounter (Signed)
Patient concern from same day visit.

## 2016-10-07 ENCOUNTER — Telehealth: Payer: Self-pay | Admitting: Family Medicine

## 2016-10-07 NOTE — Telephone Encounter (Signed)
Patient called the office to speak with nurse regarding the medication reaction that he is having for taking sinus medication. Pt stated that he has been experiencing diarrhea. Wants to know what he should do. Please follow up. Thank you.

## 2016-10-07 NOTE — Telephone Encounter (Signed)
Pt took ATB, Augmentin since Monday.  Pt states he has had diarrhea since Tuesday, while taking medication. He has had 2 loose stools today. Not a lot.  Encouraged to drink plenty of water and to eat while taking medication. Pt has apt with  In the morning for URI sx's made prior to message. Please advise.

## 2016-10-08 ENCOUNTER — Telehealth: Payer: Self-pay | Admitting: Family Medicine

## 2016-10-08 ENCOUNTER — Other Ambulatory Visit: Payer: Self-pay | Admitting: Family Medicine

## 2016-10-08 ENCOUNTER — Ambulatory Visit: Payer: BC Managed Care – PPO | Attending: Family Medicine | Admitting: Physician Assistant

## 2016-10-08 ENCOUNTER — Encounter: Payer: Self-pay | Admitting: Family Medicine

## 2016-10-08 VITALS — BP 160/91 | HR 115 | Temp 98.6°F | Ht 70.0 in | Wt 365.4 lb

## 2016-10-08 DIAGNOSIS — R197 Diarrhea, unspecified: Secondary | ICD-10-CM | POA: Insufficient documentation

## 2016-10-08 DIAGNOSIS — F419 Anxiety disorder, unspecified: Secondary | ICD-10-CM

## 2016-10-08 DIAGNOSIS — E669 Obesity, unspecified: Secondary | ICD-10-CM | POA: Insufficient documentation

## 2016-10-08 DIAGNOSIS — I10 Essential (primary) hypertension: Secondary | ICD-10-CM | POA: Insufficient documentation

## 2016-10-08 MED ORDER — BUSPIRONE HCL 5 MG PO TABS: 5.0000 mg | ORAL_TABLET | Freq: Three times a day (TID) | ORAL | 0 refills | Status: DC

## 2016-10-08 NOTE — Progress Notes (Signed)
Peter Hahn, is a 36 y.o. male  ZOX:[id number]:[id number]  [id number]:[id number]  DOB - 05/25/1981  Subjective:  Chief Complaint and HPI: Peter Hahn is a 36 y.o. male here today because he was started on Augmentin 5 days ago and has been having diarrhea/loose stools 3 times daily.  He had a recent viral illness with loose stools that had improved prior to starting the Augmentin.  His sinus s/sx have improved.  No blood/mucus in his stool.  No f/c.  No abdominal pain.    Checking BP at home and brings in his cuff.  I compared it against ours and his is reading accurately.  His home numbers show controlled BP-His BP at home is ~114-125/70s-low 80s with pulse ranging from 70-90.  Compliant with medications.  ROS:   Constitutional:  No f/c, No night sweats, No unexplained weight loss. EENT:  No vision changes, No blurry vision, No hearing changes. No mouth, throat, or ear problems.  Respiratory: No cough, No SOB Cardiac: No CP, no palpitations GI:  No abd pain, No N/V, +D. GU: No Urinary s/sx Musculoskeletal: No joint pain Neuro: No headache, no dizziness, no motor weakness.  Skin: No rash Endocrine:  No polydipsia. No polyuria.  Psych: Denies SI/HI  No problems updated.  ALLERGIES: Allergies  Allergen Reactions  . Diltiazem     Body aches, chills  . Pollen Extract     Sneezing, headaches    PAST MEDICAL HISTORY: Past Medical History:  Diagnosis Date  . Allergy    since birth   . Hypertension 2006   first told his BP was high, never treated   . Obesity   . Pseudohypoparathyroidism     MEDICATIONS AT HOME: Prior to Admission medications   Medication Sig Start Date End Date Taking? Authorizing Provider  acetaminophen (TYLENOL) 325 MG tablet Take 2 tablets (650 mg total) by mouth every 6 (six) hours as needed. 10/03/16  Yes Everlene FarrierWilliam Dansie, PA-C  amLODipine (NORVASC) 10 MG tablet Take 1 tablet (10 mg total) by mouth daily. 09/30/16  Yes Marzella SchleinAngela M Emmely Bittinger, PA-C  amoxicillin-clavulanate  (AUGMENTIN) 875-125 MG tablet Take 1 tablet by mouth every 12 (twelve) hours. 10/04/16 10/11/16 Yes Beau FannyJohn C Withrow, FNP  calcium citrate (CALCITRATE - DOSED IN MG ELEMENTAL CALCIUM) 950 MG tablet Take 2 tablets (400 mg of elemental calcium total) by mouth 4 (four) times daily. 11/26/14  Yes Josalyn Funches, MD  cetirizine (ZYRTEC ALLERGY) 10 MG tablet Take 1 tablet (10 mg total) by mouth daily. 10/03/16  Yes Everlene FarrierWilliam Dansie, PA-C  fluticasone (FLONASE) 50 MCG/ACT nasal spray PLACE 2 SPRAYS INTO BOTH NOSTRILS DAILY. 09/30/16  Yes Marzella SchleinAngela M Hassaan Crite, PA-C  lisinopril (PRINIVIL,ZESTRIL) 40 MG tablet Take 1 tablet (40 mg total) by mouth daily. 09/30/16  Yes Marzella SchleinAngela M Rye Dorado, PA-C  fluocinonide cream (LIDEX) 0.05 % Apply 1 application topically 2 (two) times daily. Patient not taking: Reported on 09/30/2016 09/03/15   Dessa PhiJosalyn Funches, MD  ipratropium (ATROVENT HFA) 17 MCG/ACT inhaler Inhale 2 puffs, every 6 hours as needed rescue Patient not taking: Reported on 09/30/2016 10/28/14   Waymon Budgelinton D Young, MD     Objective:  EXAM:   Vitals:   10/08/16 1041  BP: (!) 160/91  Pulse: (!) 115  Temp: 98.6 F (37 C)  TempSrc: Oral  SpO2: 98%  Weight: (!) 365 lb 6.4 oz (165.7 kg)  Height: 5\' 10"  (1.778 m)    General appearance : A&OX3. NAD. Non-toxic-appearing, obese HEENT: Atraumatic and Normocephalic.  PERRLA. EOM intact.  TM clear B. Mouth-MMM, post pharynx WNL w/o erythema, No PND. Neck: supple, no JVD. No cervical lymphadenopathy. No thyromegaly Chest/Lungs:  Breathing-non-labored, Good air entry bilaterally, breath sounds normal without rales, rhonchi, or wheezing  CVS: S1 S2 regular, no murmurs, gallops, rubs, Rate at 92 by time of exam Abdomen: Bowel sounds present, Non tender and not distended with no gaurding, rigidity or rebound. Extremities: Bilateral Lower Ext shows no edema, both legs are warm to touch with = pulse throughout Neurology:  CN II-XII grossly intact, Non focal.   Psych:  TP linear. J/I WNL.  Normal speech. Appropriate eye contact and affect.  Skin:  No Rash  Data Review Lab Results  Component Value Date   HGBA1C 5.70 08/15/2015   HGBA1C 6.0 08/26/2014     Assessment & Plan   1. Essential hypertension It appears uncontrolled here, however, he brought his cuff from home, and it is measured against ours and reading accurately.  His BP at home is ~114-125/70s-low 80s with pulse ranging from 70-90 and appears controlled.  He gets very anxious befor coming in.  Continue current BP regimen.  Continue checking BP OOO regularly.  We have discussed target BP range and blood pressure goal. I have advised patient to check BP regularly and to call us back or report to clinic if the numbers are consistently higher than 140/90. We discussed the importance of compliance with medical therapy and DASH diet recommended, consequences of uncontrolled hypertension discussed.   2. Diarrhea/loose stool while on Augmentin-only having 3 episodes daily; so, this is not severe.  At this stage, I recommend that he finish the antibiotics and stay hydrated.  He can try Immodium for the loose stool..  Loose stool should improve upon finishing antibiotics.  If it doesn't improve, or if it worsens, he has been instructed to RTC.  Patient have been counseled extensively about nutrition and exercise  Return in about 3 months (around 01/08/2017) for dr funches BP f/up.  The patient was given clear instructions to go to ER or return to medical center if symptoms don't improve, worsen or new problems develop. The patient verbalized understanding. The patient was told to call to get lab results if they haven't heard anything in the next week.     Georgian Co, PA-C [facility] [place], [place] [phone]   10/08/2016, 11:03 AMPatient ID: Peter Hahn, male   DOB: Jul 25, 1981, 36 y.o.   MRN: [id number]

## 2016-10-08 NOTE — Telephone Encounter (Signed)
buspar sent in for anxiety This is a low dose of 5 mg three times daily to avoid side effects Common side effect is dizziness This is a safe long term option Patient should f/u with Dr. Armen PickupFunches in 6 weeks for anxiety

## 2016-10-08 NOTE — Telephone Encounter (Signed)
Patient states he is experiencing some anxiety and would like to know if something can be prescribed to manage his symptoms.  Please follow up

## 2016-10-08 NOTE — Telephone Encounter (Signed)
Noted Patient has f/u appt today

## 2016-10-11 NOTE — Telephone Encounter (Signed)
Pt was called and informed of medication being sent over to pharmacy, and to make a follow up visit.

## 2016-10-13 ENCOUNTER — Encounter: Payer: Self-pay | Admitting: Family Medicine

## 2016-10-14 ENCOUNTER — Telehealth: Payer: BC Managed Care – PPO | Admitting: Physician Assistant

## 2016-10-14 ENCOUNTER — Encounter: Payer: Self-pay | Admitting: Physician Assistant

## 2016-10-14 DIAGNOSIS — J329 Chronic sinusitis, unspecified: Secondary | ICD-10-CM

## 2016-10-14 NOTE — Progress Notes (Signed)
Based on what you shared with me it looks like you have a condition that should be evaluated in a face to face office visit.  Because some symptoms have persisted despite antibiotics and some have recurred, you need a face to face visit for assessment to determine next steps in treatment. This may include supportive measures, another antibiotic or even steroids if needed.  NOTE: Even if you have entered your credit card information for this eVisit, you will not be charged.   If you are having a true medical emergency please call 911.  If you need an urgent face to face visit, [place] has four urgent care centers for your convenience.  If you need care fast and have a high deductible or no insurance consider:   [email or web address]  [phone]  [address], [address] [place], [place] [address] 8 am to 8 pm Monday-Friday 10 am to 4 pm Saturday-Sunday   The following sites will take your  insurance:    . [facility]  [phone] Get Driving Directions Find a Provider at this Location  [address] [place], [place] [address] . 10 am to 8 pm Monday-Friday . 12 pm to 8 pm Saturday-Sunday   . [facility]  [phone] Get Driving Directions Find a Provider at this Location  [address], [address] [place], [place] [address] . 8 am to 8 pm Monday-Friday . 9 am to 6 pm Saturday . 11 am to 6 pm Sunday   . [facility] [place]  [phone] Get Driving Directions  [address] [place], [place] [address] . 8 am to 8 pm Monday-Friday . 8 am to 4 pm Saturday-Sunday   Your e-visit answers were reviewed by a board certified advanced clinical practitioner to complete your personal care plan.  Thank you for using e-Visits.

## 2016-10-14 NOTE — Telephone Encounter (Signed)
Patient concern

## 2016-10-15 ENCOUNTER — Encounter (HOSPITAL_COMMUNITY): Payer: Self-pay

## 2016-10-15 ENCOUNTER — Emergency Department (HOSPITAL_COMMUNITY): Payer: BC Managed Care – PPO

## 2016-10-15 DIAGNOSIS — F430 Acute stress reaction: Secondary | ICD-10-CM | POA: Insufficient documentation

## 2016-10-15 DIAGNOSIS — I10 Essential (primary) hypertension: Secondary | ICD-10-CM | POA: Diagnosis not present

## 2016-10-15 DIAGNOSIS — R079 Chest pain, unspecified: Secondary | ICD-10-CM | POA: Diagnosis present

## 2016-10-15 LAB — CBC
HCT: 39.5 % (ref 39.0–52.0)
Hemoglobin: 12.9 g/dL — ABNORMAL LOW (ref 13.0–17.0)
MCH: 29.9 pg (ref 26.0–34.0)
MCHC: 32.7 g/dL (ref 30.0–36.0)
MCV: 91.6 fL (ref 78.0–100.0)
PLATELETS: 276 10*3/uL (ref 150–400)
RBC: 4.31 MIL/uL (ref 4.22–5.81)
RDW: 14.2 % (ref 11.5–15.5)
WBC: 13.1 10*3/uL — ABNORMAL HIGH (ref 4.0–10.5)

## 2016-10-15 LAB — BASIC METABOLIC PANEL
Anion gap: 12 (ref 5–15)
BUN: 9 mg/dL (ref 6–20)
CALCIUM: 7.5 mg/dL — AB (ref 8.9–10.3)
CHLORIDE: 100 mmol/L — AB (ref 101–111)
CO2: 26 mmol/L (ref 22–32)
CREATININE: 1.07 mg/dL (ref 0.61–1.24)
GFR calc Af Amer: >60 mL/min (ref >60)
GFR calc non Af Amer: >60 mL/min (ref >60)
Glucose, Bld: 110 mg/dL — ABNORMAL HIGH (ref 65–99)
Potassium: 3.5 mmol/L (ref 3.5–5.1)
SODIUM: 138 mmol/L (ref 135–145)

## 2016-10-15 LAB — I-STAT TROPONIN, ED: TROPONIN I, POC: 0 ng/mL (ref 0.00–0.08)

## 2016-10-15 NOTE — ED Triage Notes (Signed)
Pt states that he has been feeling anxious for the past month with a lot going on, stated that about 15 minutes ago started having L sided CP, denies n/v/sob, pain radiated to L shoulder and arm area.

## 2016-10-16 ENCOUNTER — Emergency Department (HOSPITAL_COMMUNITY)
Admission: EM | Admit: 2016-10-16 | Discharge: 2016-10-16 | Disposition: A | Discharge status: Home / Self Care | Payer: BC Managed Care – PPO | Attending: Emergency Medicine | Admitting: Emergency Medicine

## 2016-10-16 DIAGNOSIS — R079 Chest pain, unspecified: Secondary | ICD-10-CM

## 2016-10-16 DIAGNOSIS — F430 Acute stress reaction: Secondary | ICD-10-CM

## 2016-10-16 NOTE — ED Provider Notes (Signed)
[facility]-EMERGENCY DEPT Provider Note   CSN: [id number] Arrival date & time: 10/15/16  2007     History   Chief Complaint Chief Complaint  Patient presents with  . Chest Pain    HPI Fredirick MaudlinWilson Leggio is a 36 y.o. male.  Patient is a 36 year old male with a history of obesity who presents to the emergency department for evaluation of chest pain. Patient describes his chest pain as a nonspecific sensation of something "coming over me" with a vague left-sided sharp pain. Patient has had no associated diaphoresis, shortness of breath, nausea, vomiting, leg swelling, or fevers. No medications taken prior to arrival for symptoms. He states that he has had similar symptoms in the past which has likely been attributed to anxiety. He has been feeling increased stress lately as he is coming up on the anniversary of his father's passing one year ago. Patient reports being prescribed Buspar by his primary care doctor for anxiety, but he has neglected to take this over concern for side effects. He has had no recent surgeries or hospitalizations. No prolonged travel. No history of DVT/PE or ACS.      Past Medical History:  Diagnosis Date  . Allergy    since birth   . Hypertension 2006   first told his BP was high, never treated   . Obesity   . Pseudohypoparathyroidism     Patient Active Problem List   Diagnosis Date Noted  . Eczema 08/15/2015  . 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 Generalized anxiety disorder 10/04/2014  . HTN (hypertension) 08/26/2014  . Hypocalcemia 08/26/2014  . Morbid obesity (HCC) 08/26/2014  . Allergic rhinitis 08/26/2014  . Pseudohypoparathyroidism     History reviewed. No pertinent surgical history.     Home Medications    Prior to Admission medications   Medication Sig Start Date End Date Taking? Authorizing Provider  acetaminophen (TYLENOL) 325 MG tablet Take 2 tablets (650 mg total) by mouth every 6 (six) hours as needed. 10/03/16   Everlene FarrierWilliam Dansie, PA-C    amLODipine (NORVASC) 10 MG tablet Take 1 tablet (10 mg total) by mouth daily. 09/30/16   Anders SimmondsAngela M McClung, PA-C  busPIRone (BUSPAR) 5 MG tablet Take 1 tablet (5 mg total) by mouth 3 (three) times daily. 10/08/16   Josalyn Funches, MD  calcium citrate (CALCITRATE - DOSED IN MG ELEMENTAL CALCIUM) 950 MG tablet Take 2 tablets (400 mg of elemental calcium total) by mouth 4 (four) times daily. 11/26/14   Dessa PhiJosalyn Funches, MD  cetirizine (ZYRTEC ALLERGY) 10 MG tablet Take 1 tablet (10 mg total) by mouth daily. 10/03/16   Everlene FarrierWilliam Dansie, PA-C  fluocinonide cream (LIDEX) 0.05 % Apply 1 application topically 2 (two) times daily. Patient not taking: Reported on 09/30/2016 09/03/15   Dessa PhiJosalyn Funches, MD  fluticasone (FLONASE) 50 MCG/ACT nasal spray PLACE 2 SPRAYS INTO BOTH NOSTRILS DAILY. 09/30/16   Anders SimmondsAngela M McClung, PA-C  ipratropium (ATROVENT HFA) 17 MCG/ACT inhaler Inhale 2 puffs, every 6 hours as needed rescue Patient not taking: Reported on 09/30/2016 10/28/14   Waymon Budgelinton D Young, MD  lisinopril (PRINIVIL,ZESTRIL) 40 MG tablet Take 1 tablet (40 mg total) by mouth daily. 09/30/16   Anders SimmondsAngela M McClung, PA-C    Family History Family History  Problem Relation Age of Onset  . Diabetes Mother   . Diabetes Father   . Hypertension Father   . Hypertension Paternal Grandmother     Social History Social History  Substance Use Topics  . Smoking status: Never Smoker  . Smokeless  tobacco: Never Used  . Alcohol use No     Allergies   Diltiazem and Pollen extract   Review of Systems Review of Systems Ten systems reviewed and are negative for acute change, except as noted in the HPI.    Physical Exam Updated Vital Signs BP 127/73 (BP Location: Left Arm)   Pulse 97   Temp 98.5 F (36.9 C) (Oral)   Resp 16   Ht 5\' 10"  (1.778 m)   Wt (!) 163.3 kg   SpO2 100%   BMI 51.65 kg/m   Physical Exam  Constitutional: He is oriented to person, place, and time. He appears well-developed and well-nourished. No distress.   Nontoxic and in NAD. Obese male.  HENT:  Head: Normocephalic and atraumatic.  Eyes: Conjunctivae and EOM are normal. No scleral icterus.  Neck: Normal range of motion.  No JVD  Cardiovascular: Normal rate, regular rhythm and intact distal pulses.   Borderline tachycardia; primarily in NSR during encounter  Pulmonary/Chest: Effort normal. No respiratory distress. He has no wheezes. He has no rales.  Lungs CTAB. Respirations even and unlabored.  Musculoskeletal: Normal range of motion.  No pitting edema in BLE  Neurological: He is alert and oriented to person, place, and time. He exhibits normal muscle tone. Coordination normal.  GCS 15. Patient moving all extremities.  Skin: Skin is warm and dry. No rash noted. He is not diaphoretic. No erythema. No pallor.  Psychiatric: His behavior is normal. His mood appears anxious (mild).  Nursing note and vitals reviewed.    ED Treatments / Results  Labs (all labs ordered are listed, but only abnormal results are displayed) Labs Reviewed  BASIC METABOLIC PANEL - Abnormal; Notable for the following:       Result Value   Chloride 100 (*)    Glucose, Bld 110 (*)    Calcium 7.5 (*)    All other components within normal limits  CBC - Abnormal; Notable for the following:    WBC 13.1 (*)    Hemoglobin 12.9 (*)    All other components within normal limits  I-STAT TROPOININ, ED    EKG  EKG Interpretation None       Radiology Dg Chest 2 View  Result Date: 10/15/2016 CLINICAL DATA:  Chest pain and tightness starting this evening. Shortness of breath. History of hypertension and pneumonia. EXAM: CHEST  2 VIEW COMPARISON:  10/03/2014 FINDINGS: The heart size and mediastinal contours are within normal limits. Both lungs are clear. The visualized skeletal structures are unremarkable. IMPRESSION: No active cardiopulmonary disease. Electronically Signed   By: Burman Nieves M.D.   On: 10/15/2016 21:24    Procedures Procedures (including  critical care time)  Medications Ordered in ED Medications - No data to display   Initial Impression / Assessment and Plan / ED Course  I have reviewed the triage vital signs and the nursing notes.  Pertinent labs & imaging results that were available during my care of the patient were reviewed by me and considered in my medical decision making (see chart for details).     36 year old male presents to the emergency department for evaluation of chest pain. He reports having similar symptoms in the past which she associates with anxiety. He complains of increased stress lately, attributed to anniversary of his father's passing one year ago. The patient has had no associated shortness of breath, dizziness, leg swelling, or diaphoresis. Given reassuring workup and nonspecific nature of symptoms, doubt emergent cardiac etiology of pain or  ACS. I do believe the patient's symptoms are related to anxiety. This would also explain his tachycardia. I do not have underlying concern for PE. Patient is at, overall, low risk for blood clots.   The patient has been advised to take Buspar as prescribed by his primary care doctor for anxiety. He has follow-up with his primary care doctor in 3 weeks. Return precautions discussed and provided. Patient discharged in stable condition with no unaddressed concerns.   Final Clinical Impressions(s) / ED Diagnoses   Final diagnoses:  Nonspecific chest pain  Acute stress reaction    New Prescriptions New Prescriptions   No medications on file     Antony Madura, PA-C 10/16/16 1610    Dione Booze, MD 10/16/16 [phone]

## 2016-10-18 ENCOUNTER — Telehealth: Payer: Self-pay | Admitting: *Deleted

## 2016-10-18 NOTE — Telephone Encounter (Signed)
Patient verified DOB Patient is aware of Provider advising OTC sudafed and Nasal spray for residual congestion. Patient states he went to the ED for a panic attack and was advised to FU with PCP in 3 weeks and continue taking Buspar as prescribed by PCP. Patient has appointment on 11/02/16 for HTN and MA informed patient of anxiety being added to the notes. No further questions at this time.

## 2016-10-25 ENCOUNTER — Telehealth: Payer: Self-pay | Admitting: Licensed Clinical Social Worker

## 2016-10-25 NOTE — Telephone Encounter (Signed)
LCSWA attempted to contact pt to follow up on behavioral health. LCSWA left message for a return call.  

## 2016-10-26 ENCOUNTER — Telehealth: Payer: Self-pay | Admitting: Licensed Clinical Social Worker

## 2016-10-26 NOTE — Telephone Encounter (Signed)
LCSWA attempted to contact pt to follow up on behavioral health. LCSWA left message for a return call.  

## 2016-10-27 ENCOUNTER — Encounter: Payer: Self-pay | Admitting: Family Medicine

## 2016-10-27 NOTE — Telephone Encounter (Signed)
Covering provider concern

## 2016-10-28 ENCOUNTER — Other Ambulatory Visit: Payer: Self-pay | Admitting: Family Medicine

## 2016-10-28 DIAGNOSIS — J320 Chronic maxillary sinusitis: Secondary | ICD-10-CM

## 2016-11-02 ENCOUNTER — Telehealth: Payer: Self-pay | Admitting: Family Medicine

## 2016-11-02 ENCOUNTER — Ambulatory Visit: Payer: BC Managed Care – PPO | Attending: Family Medicine | Admitting: Family Medicine

## 2016-11-02 ENCOUNTER — Encounter: Payer: Self-pay | Admitting: Family Medicine

## 2016-11-02 VITALS — BP 155/100 | HR 72 | Temp 98.0°F | Ht 70.0 in | Wt 356.8 lb

## 2016-11-02 DIAGNOSIS — I10 Essential (primary) hypertension: Secondary | ICD-10-CM | POA: Insufficient documentation

## 2016-11-02 DIAGNOSIS — R0981 Nasal congestion: Secondary | ICD-10-CM | POA: Insufficient documentation

## 2016-11-02 DIAGNOSIS — F411 Generalized anxiety disorder: Secondary | ICD-10-CM | POA: Insufficient documentation

## 2016-11-02 DIAGNOSIS — F419 Anxiety disorder, unspecified: Secondary | ICD-10-CM | POA: Diagnosis present

## 2016-11-02 MED ORDER — CETIRIZINE HCL 10 MG PO TABS: 10.0000 mg | ORAL_TABLET | Freq: Every day | ORAL | 1 refills | Status: DC

## 2016-11-02 MED ORDER — FLUTICASONE PROPIONATE 50 MCG/ACT NA SUSP: NASAL | 5 refills | Status: DC

## 2016-11-02 MED ORDER — CETIRIZINE HCL 10 MG PO TABS: 10.0000 mg | ORAL_TABLET | Freq: Every day | ORAL | 5 refills | Status: DC

## 2016-11-02 NOTE — Progress Notes (Signed)
Subjective:  Patient ID: Peter Hahn, male    DOB: 11-01-1980  Age: 36 y.o. MRN: [id number]  CC: Hypertension and Anxiety   HPI Talib Headley presents for   1. Sinusitis: improving. He is taking zyrtec and flonase. Symptoms are improving. No fever.   2. Anxiety: he had increased anxiety last month. He was thinking about the anniversary of his father passing away, sinus infection. He went to support group and got a massage. He has another massage scheduled for next weekend. He reports his anxiety improved. He did no start buspar.   3. .CHRONIC HYPERTENSION  Disease Monitoring  Blood pressure range:   128/77, 124/76, 135/85, 133/84  Chest pain: yes   Dyspnea: yes   Claudication: yes   Medication compliance: yes  Medication Side Effects  Lightheadedness: no   Urinary frequency: no   Edema: no    Social History  Substance Use Topics  . Smoking status: Never Smoker  . Smokeless tobacco: Never Used  . Alcohol use No    Outpatient Medications Prior to Visit  Medication Sig Dispense Refill  . acetaminophen (TYLENOL) 325 MG tablet Take 2 tablets (650 mg total) by mouth every 6 (six) hours as needed. 60 tablet 0  . amLODipine (NORVASC) 10 MG tablet Take 1 tablet (10 mg total) by mouth daily. 90 tablet 0  . busPIRone (BUSPAR) 5 MG tablet Take 1 tablet (5 mg total) by mouth 3 (three) times daily. 90 tablet 0  . calcium citrate (CALCITRATE - DOSED IN MG ELEMENTAL CALCIUM) 950 MG tablet Take 2 tablets (400 mg of elemental calcium total) by mouth 4 (four) times daily. 270 tablet 1  . cetirizine (ZYRTEC ALLERGY) 10 MG tablet Take 1 tablet (10 mg total) by mouth daily. 30 tablet 1  . fluticasone (FLONASE) 50 MCG/ACT nasal spray PLACE 2 SPRAYS INTO BOTH NOSTRILS DAILY. 16 g 2  . lisinopril (PRINIVIL,ZESTRIL) 40 MG tablet Take 1 tablet (40 mg total) by mouth daily. 90 tablet 0  . fluocinonide cream (LIDEX) 0.05 % Apply 1 application topically 2 (two) times daily. (Patient not taking:  Reported on 09/30/2016) 60 g 0  . ipratropium (ATROVENT HFA) 17 MCG/ACT inhaler Inhale 2 puffs, every 6 hours as needed rescue (Patient not taking: Reported on 09/30/2016) 1 Inhaler 12   No facility-administered medications prior to visit.     ROS Review of Systems  Constitutional: Negative for chills, fatigue, fever and unexpected weight change.  HENT: Positive for sinus pressure.   Eyes: Negative for visual disturbance.  Respiratory: Negative for cough and shortness of breath.   Cardiovascular: Negative for chest pain, palpitations and leg swelling.  Gastrointestinal: Negative for abdominal pain, blood in stool, constipation, diarrhea, nausea and vomiting.  Endocrine: Negative for polydipsia, polyphagia and polyuria.  Musculoskeletal: Negative for arthralgias, back pain, gait problem, myalgias and neck pain.  Skin: Negative for rash.  Allergic/Immunologic: Negative for immunocompromised state.  Hematological: Negative for adenopathy. Does not bruise/bleed easily.  Psychiatric/Behavioral: Negative for dysphoric mood, sleep disturbance and suicidal ideas. The patient is not nervous/anxious.     Objective:  BP (!) 155/100 Comment: pt states that he is nervous. pt BP at home was 133/84  Pulse 72   Temp 98 F (36.7 C) (Oral)   Ht  (1.778 m)   Wt (!) 356 lb 12.8 oz (161.8 kg)   SpO2 98%   BMI 51.20 kg/m   BP/Weight 11/02/2016 10/16/2016 10/15/2016  Systolic BP 155 117 -  Diastolic BP 100 76 -  Wt. (Lbs) 356.8 - 360  BMI 51.2 51.65 -    Physical Exam  Constitutional: He appears well-developed and well-nourished. No distress.  HENT:  Head: Normocephalic and atraumatic.  Neck: Normal range of motion. Neck supple.  Cardiovascular: Normal rate, regular rhythm, normal heart sounds and intact distal pulses.   Pulmonary/Chest: Effort normal and breath sounds normal.  Musculoskeletal: He exhibits no edema.  Neurological: He is alert.  Skin: Skin is warm and dry. No rash noted. No  erythema.  Psychiatric: He has a normal mood and affect.   Depression screen [facility] 2/9 11/02/2016 10/08/2016 09/30/2016  Decreased Interest 0 1 (No Data)  Down, Depressed, Hopeless PHQ - 2 Score Altered sleeping Tired, decreased energy Change in appetite 0 1 1  Feeling bad or failure about yourself  0 1 0  Trouble concentrating 0 1 0  Moving slowly or fidgety/restless 0 1 0  Suicidal thoughts 0 0 0  PHQ-9 Score GAD 7 : Generalized Anxiety Score 11/02/2016 10/08/2016 09/30/2016 08/15/2015  Nervous, Anxious, on Edge 0  Control/stop worrying 0  Worry too much - different things 0  Trouble relaxing 1 2 0 0  Restless 0 1 0 0  Easily annoyed or irritable 0 1 0 0  Afraid - awful might happen 1 1 0 0  Total GAD 7 Score 0    Assessment & Plan:  Dmitry was seen today for hypertension and anxiety.  Diagnoses and all orders for this visit:  Essential hypertension  Generalized anxiety disorder  Congestion of nasal sinus -     fluticasone (FLONASE) 50 MCG/ACT nasal spray; PLACE 2 SPRAYS INTO BOTH NOSTRILS DAILY.  Other orders -     Discontinue: cetirizine (ZYRTEC ALLERGY) 10 MG tablet; Take 1 tablet (10 mg total) by mouth daily. -     cetirizine (ZYRTEC ALLERGY) 10 MG tablet; Take 1 tablet (10 mg total) by mouth daily.   There are no diagnoses linked to this encounter.  No orders of the defined types were placed in this encounter.   Follow-up: Return in about 3 months (around 02/01/2017) for HTN .   Dessa Phi MD

## 2016-11-02 NOTE — Patient Instructions (Addendum)
Vash was seen today for hypertension and anxiety.  Diagnoses and all orders for this visit:  Essential hypertension  Generalized anxiety disorder  Other orders -     cetirizine (ZYRTEC ALLERGY) 10 MG tablet; Take 1 tablet (10 mg total) by mouth daily.   Keep monitoring BP at home, your readings are excellent  If elevated please call Exercise to help reduce stress and keep BP controlled Try heated humidifier for sinuses  Follow up in 3 months for hypertension, sooner if needed   Dr. Armen Pickup

## 2016-11-02 NOTE — Telephone Encounter (Signed)
Called patient's pharmacy Discontinued Buspar prescription Left voicemail

## 2016-11-02 NOTE — Assessment & Plan Note (Signed)
Improved with support group and massage Continue treatment

## 2016-11-02 NOTE — Assessment & Plan Note (Signed)
Normotensive at home Compliant with regimen Continue current regimen

## 2016-12-02 ENCOUNTER — Encounter: Payer: Self-pay | Admitting: Family Medicine

## 2016-12-02 DIAGNOSIS — J301 Allergic rhinitis due to pollen: Secondary | ICD-10-CM

## 2016-12-02 NOTE — Telephone Encounter (Signed)
Patient is requesting an alternative medication for his allergies.

## 2016-12-03 MED ORDER — FEXOFENADINE HCL 180 MG PO TABS: 180.0000 mg | ORAL_TABLET | Freq: Every day | ORAL | 2 refills | Status: DC

## 2016-12-15 ENCOUNTER — Encounter: Payer: Self-pay | Admitting: Family Medicine

## 2016-12-28 ENCOUNTER — Other Ambulatory Visit: Payer: Self-pay | Admitting: Physician Assistant

## 2016-12-28 DIAGNOSIS — I10 Essential (primary) hypertension: Secondary | ICD-10-CM

## 2016-12-30 ENCOUNTER — Other Ambulatory Visit: Payer: Self-pay | Admitting: Physician Assistant

## 2016-12-30 DIAGNOSIS — I10 Essential (primary) hypertension: Secondary | ICD-10-CM

## 2017-01-04 MED ORDER — AMLODIPINE BESYLATE 10 MG PO TABS: 10.0000 mg | ORAL_TABLET | Freq: Every day | ORAL | 1 refills | Status: DC

## 2017-01-04 MED ORDER — LISINOPRIL 40 MG PO TABS: 40.0000 mg | ORAL_TABLET | Freq: Every day | ORAL | 1 refills | Status: DC

## 2017-01-04 NOTE — Telephone Encounter (Signed)
Refill request

## 2017-02-10 ENCOUNTER — Ambulatory Visit: Payer: BC Managed Care – PPO | Admitting: Family Medicine

## 2017-02-10 ENCOUNTER — Encounter: Payer: Self-pay | Admitting: Physician Assistant

## 2017-02-10 NOTE — Progress Notes (Deleted)
Subjective:  Patient ID: Peter Hahn, male    DOB: 1981/05/19  Age: 36 y.o. MRN: [id number]  CC: No chief complaint on file.   HPI Peter Hahn presents for   1. Sinusitis: improving. He is taking zyrtec and flonase. Symptoms are improving. No fever.   2. Anxiety: he had increased anxiety last month. He was thinking about the anniversary of his father passing away, sinus infection. He went to support group and got a massage. He has another massage scheduled for next weekend. He reports his anxiety improved. He did no start buspar.   3. .CHRONIC HYPERTENSION  Disease Monitoring  Blood pressure range:   128/77, 124/76, 135/85, 133/84  Chest pain: yes   Dyspnea: yes   Claudication: yes   Medication compliance: yes  Medication Side Effects  Lightheadedness: no   Urinary frequency: no   Edema: no    Social History  Substance Use Topics  . Smoking status: Never Smoker  . Smokeless tobacco: Never Used  . Alcohol use No    Outpatient Medications Prior to Visit  Medication Sig Dispense Refill  . acetaminophen (TYLENOL) 325 MG tablet Take 2 tablets (650 mg total) by mouth every 6 (six) hours as needed. 60 tablet 0  . amLODipine (NORVASC) 10 MG tablet Take 1 tablet (10 mg total) by mouth daily. 90 tablet 1  . calcium citrate (CALCITRATE - DOSED IN MG ELEMENTAL CALCIUM) 950 MG tablet Take 2 tablets (400 mg of elemental calcium total) by mouth 4 (four) times daily. 270 tablet 1  . fexofenadine (ALLEGRA ALLERGY) 180 MG tablet Take 1 tablet (180 mg total) by mouth daily. 30 tablet 2  . fluocinonide cream (LIDEX) 0.05 % Apply 1 application topically 2 (two) times daily. (Patient not taking: Reported on 09/30/2016) 60 g 0  . fluticasone (FLONASE) 50 MCG/ACT nasal spray PLACE 2 SPRAYS INTO BOTH NOSTRILS DAILY. 16 g 5  . ipratropium (ATROVENT HFA) 17 MCG/ACT inhaler Inhale 2 puffs, every 6 hours as needed rescue (Patient not taking: Reported on 09/30/2016) 1 Inhaler 12  . lisinopril  (PRINIVIL,ZESTRIL) 40 MG tablet Take 1 tablet (40 mg total) by mouth daily. 90 tablet 1   No facility-administered medications prior to visit.     ROS Review of Systems  Constitutional: Negative for chills, fatigue, fever and unexpected weight change.  HENT: Positive for sinus pressure.   Eyes: Negative for visual disturbance.  Respiratory: Negative for cough and shortness of breath.   Cardiovascular: Negative for chest pain, palpitations and leg swelling.  Gastrointestinal: Negative for abdominal pain, blood in stool, constipation, diarrhea, nausea and vomiting.  Endocrine: Negative for polydipsia, polyphagia and polyuria.  Musculoskeletal: Negative for arthralgias, back pain, gait problem, myalgias and neck pain.  Skin: Negative for rash.  Allergic/Immunologic: Negative for immunocompromised state.  Hematological: Negative for adenopathy. Does not bruise/bleed easily.  Psychiatric/Behavioral: Negative for dysphoric mood, sleep disturbance and suicidal ideas. The patient is not nervous/anxious.     Objective:  There were no vitals taken for this visit.  BP/Weight 11/02/2016 10/16/2016 10/15/2016  Systolic BP 155 117 -  Diastolic BP 100 76 -  Wt. (Lbs) 356.8 - 360  BMI 51.2 51.65 -    Physical Exam  Constitutional: He appears well-developed and well-nourished. No distress.  HENT:  Head: Normocephalic and atraumatic.  Neck: Normal range of motion. Neck supple.  Cardiovascular: Normal rate, regular rhythm, normal heart sounds and intact distal pulses.   Pulmonary/Chest: Effort normal and breath sounds normal.  Musculoskeletal: He exhibits  no edema.  Neurological: He is alert.  Skin: Skin is warm and dry. No rash noted. No erythema.  Psychiatric: He has a normal mood and affect.   [facility] 2/9 11/02/2016 10/08/2016 09/30/2016  Decreased Interest 0 1 (No Data)  Down, Depressed, Hopeless 1 2 1   PHQ - 2 Score 1 3 1   Altered sleeping 1 2 1   Tired, decreased energy 1 2 1     Change in appetite 0 1 1  Feeling bad or failure about yourself  0 1 0  Trouble concentrating 0 1 0  Moving slowly or fidgety/restless 0 1 0  Suicidal thoughts 0 0 0  PHQ-9 Score 3 11 4    GAD 7 : Generalized Anxiety Score 11/02/2016 10/08/2016 09/30/2016 08/15/2015  Nervous, Anxious, on Edge 1 2 2  0  Control/stop worrying 1 2 1  0  Worry too much - different things 1 1 1  0  Trouble relaxing 1 2 0 0  Restless 0 1 0 0  Easily annoyed or irritable 0 1 0 0  Afraid - awful might happen 1 1 0 0  Total GAD 7 Score 5 10 4  0    Assessment & Plan:  There are no diagnoses linked to this encounter. There are no diagnoses linked to this encounter.  No orders of the defined types were placed in this encounter.   Follow-up: No Follow-up on file.   Dessa PhiJosalyn Braxston Quinter MD

## 2017-02-11 MED ORDER — LEVOCETIRIZINE DIHYDROCHLORIDE 5 MG PO TABS: 5.0000 mg | ORAL_TABLET | Freq: Every evening | ORAL | 5 refills | Status: DC

## 2017-02-17 ENCOUNTER — Telehealth: Payer: BC Managed Care – PPO | Admitting: Physician Assistant

## 2017-02-17 DIAGNOSIS — R21 Rash and other nonspecific skin eruption: Secondary | ICD-10-CM

## 2017-02-17 DIAGNOSIS — L0292 Furuncle, unspecified: Secondary | ICD-10-CM

## 2017-02-17 NOTE — Progress Notes (Signed)
Based on what you shared with me it looks like you have a condition that should be evaluated in a face to face office visit.  You need a physical examination to determine treatment for boils and further evaluation of rash since it does not seem related to an allergic reaction.  NOTE: Even if you have entered your credit card information for this eVisit, you will not be charged.   If you are having a true medical emergency please call 911.  If you need an urgent face to face visit, [facility] has four urgent care centers for your convenience.  If you need care fast and have a high deductible or no insurance consider:   [email or web address]  [phone]  [address], [address] [place], [place] [address] 8 am to 8 pm Monday-Friday 10 am to 4 pm Saturday-Sunday   The following sites will take your  insurance:    . [facility]  [phone] Get Driving Directions Find a Provider at this Location  [address] [place], [place] [address] . 10 am to 8 pm Monday-Friday . 12 pm to 8 pm Saturday-Sunday   . [facility]  [phone] Get Driving Directions Find a Provider at this Location  [address] [place], [place] [address] . 8 am to 8 pm Monday-Friday . 9 am to 6 pm Saturday . 11 am to 6 pm Sunday   . [facility]  [phone] Get Driving Directions  [id number] [place], [place] [address] . 8 am to 8 pm Monday-Friday . 8 am to 4 pm Saturday-Sunday   Your e-visit answers were reviewed by a board certified advanced clinical practitioner to complete your personal care plan.  Thank you for using e-Visits.

## 2017-03-03 ENCOUNTER — Ambulatory Visit: Payer: BC Managed Care – PPO | Admitting: Family Medicine

## 2017-03-11 ENCOUNTER — Ambulatory Visit: Payer: BC Managed Care – PPO | Admitting: Internal Medicine

## 2017-03-15 ENCOUNTER — Encounter: Payer: Self-pay | Admitting: Internal Medicine

## 2017-03-15 ENCOUNTER — Other Ambulatory Visit: Payer: Self-pay | Admitting: Internal Medicine

## 2017-03-15 ENCOUNTER — Ambulatory Visit: Payer: BC Managed Care – PPO | Attending: Internal Medicine | Admitting: Internal Medicine

## 2017-03-15 VITALS — BP 121/82 | HR 119 | Temp 99.4°F | Resp 16 | Wt 357.8 lb

## 2017-03-15 DIAGNOSIS — J309 Allergic rhinitis, unspecified: Secondary | ICD-10-CM | POA: Insufficient documentation

## 2017-03-15 DIAGNOSIS — Z2821 Immunization not carried out because of patient refusal: Secondary | ICD-10-CM

## 2017-03-15 DIAGNOSIS — Z289 Immunization not carried out for unspecified reason: Secondary | ICD-10-CM

## 2017-03-15 DIAGNOSIS — Z8249 Family history of ischemic heart disease and other diseases of the circulatory system: Secondary | ICD-10-CM | POA: Diagnosis not present

## 2017-03-15 DIAGNOSIS — Z888 Allergy status to other drugs, medicaments and biological substances status: Secondary | ICD-10-CM | POA: Insufficient documentation

## 2017-03-15 DIAGNOSIS — I10 Essential (primary) hypertension: Secondary | ICD-10-CM

## 2017-03-15 DIAGNOSIS — Z9109 Other allergy status, other than to drugs and biological substances: Secondary | ICD-10-CM | POA: Diagnosis not present

## 2017-03-15 DIAGNOSIS — Z833 Family history of diabetes mellitus: Secondary | ICD-10-CM | POA: Diagnosis not present

## 2017-03-15 DIAGNOSIS — J301 Allergic rhinitis due to pollen: Secondary | ICD-10-CM | POA: Diagnosis not present

## 2017-03-15 DIAGNOSIS — Z6841 Body Mass Index (BMI) 40.0 and over, adult: Secondary | ICD-10-CM | POA: Diagnosis not present

## 2017-03-15 MED ORDER — SULFAMETHOXAZOLE-TRIMETHOPRIM 800-160 MG PO TABS: 1.0000 | ORAL_TABLET | Freq: Two times a day (BID) | ORAL | 0 refills | Status: DC

## 2017-03-15 NOTE — Progress Notes (Signed)
Patient ID: Peter Hahn, male    DOB: 10/02/1980  MRN: [id number]  CC: Follow-up   Subjective: Peter Hahn is a 36 y.o. male who presents to est care with me. His concerns today include:  Pt with hx of HTN, morbid obesity, gen anxiety, allergic rhinitis and eczema.  1. Allergic Rhinitis -sinus issues better -has appt with [facility] next mth.  -got recall letter on the Flonase - found glass in that lot. He stop using and is skeptical about restarting.  Taking Xyzal and doing ok.   2. HTN: Compliant with meds and tries to limit salt in foods No CP/SOB/LE edema  3.  [facility] Wt Readings from Last 3 Encounters:  03/15/17 (!) 357 lb 12.8 oz (162.3 kg)  11/02/16 (!) 356 lb 12.8 oz (161.8 kg)  10/15/16 (!) 360 lb (163.3 kg)  -works at [employer].  Doing a lot more walking on campus -drinking more water.  Eating 3 meals a day and has cut out fast foods. Tries to take lunch from home. -"I never have a big portion because I get full quick."   Patient Active Problem List   Diagnosis Date Noted  . Eczema 08/15/2015  . 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 Generalized anxiety disorder 10/04/2014  . HTN (hypertension) 08/26/2014  . Hypocalcemia 08/26/2014  . Morbid obesity (HCC) 08/26/2014  . Allergic rhinitis 08/26/2014  . Pseudohypoparathyroidism      Current Outpatient Prescriptions on File Prior to Visit  Medication Sig Dispense Refill  . acetaminophen (TYLENOL) 325 MG tablet Take 2 tablets (650 mg total) by mouth every 6 (six) hours as needed. 60 tablet 0  . amLODipine (NORVASC) 10 MG tablet Take 1 tablet (10 mg total) by mouth daily. 90 tablet 1  . calcium citrate (CALCITRATE - DOSED IN MG ELEMENTAL CALCIUM) 950 MG tablet Take 2 tablets (400 mg of elemental calcium total) by mouth 4 (four) times daily. 270 tablet 1  . fluticasone (FLONASE) 50 MCG/ACT nasal spray PLACE 2 SPRAYS INTO BOTH NOSTRILS DAILY. 16 g 5  . levocetirizine (XYZAL) 5 MG tablet Take 1 tablet (5 mg total) by mouth  every evening. 30 tablet 5  . lisinopril (PRINIVIL,ZESTRIL) 40 MG tablet Take 1 tablet (40 mg total) by mouth daily. 90 tablet 1   No current facility-administered medications on file prior to visit.     Allergies  Allergen Reactions  . Diltiazem     Body aches, chills  . Pollen Extract     Sneezing, headaches    Social History   Social History  . Marital status: Single    Spouse name: N/A  . Number of children: 0  . Years of education: N/A   Occupational History  . Not on file.   Social History Main Topics  . Smoking status: Never Smoker  . Smokeless tobacco: Never Used  . Alcohol use No  . Drug use: No  . Sexual activity: Yes    Partners: Female    Birth control/ protection: Condom   Other Topics Concern  . Not on file   Social History Narrative  . No narrative on file    Family History  Problem Relation Age of Onset  . Diabetes Mother   . Diabetes Father   . Hypertension Father   . Hypertension Paternal Grandmother     No past surgical history on file.  ROS: Review of Systems Negative except as stated above  PHYSICAL EXAM: BP 121/82   Pulse (!) 119   Temp  99.4 F (37.4 C) (Oral)   Resp 16   Wt (!) 357 lb 12.8 oz (162.3 kg)   SpO2 98%   BMI 51.34 kg/m   Physical Exam General appearance - alert, well appearing,Morbidly obese middle-age African-American male and in no distress Mental status - alert, oriented to person, place, and time, normal mood, behavior, speech, dress, motor activity, and thought processes Nose -enlarged nasal turbinate on the left  Mouth - mucous membranes moist, pharynx normal without lesions Neck - supple, no significant adenopathy Chest - clear to auscultation, no wheezes, rales or rhonchi, symmetric air entry Heart - normal rate, regular rhythm, normal S1, S2, no murmurs, rubs, clicks or gallops.  Record pulse by nurse elevated but on exam, heart is regular rate EXT: No lower extremity edema Depression screen [facility] 2/9  03/15/2017  Decreased Interest 0  Down, Depressed, Hopeless 0  PHQ - 2 Score 0  Altered sleeping -  Tired, decreased energy -  Change in appetite -  Feeling bad or failure about yourself  -  Trouble concentrating -  Moving slowly or fidgety/restless -  Suicidal thoughts -  PHQ-9 Score -   GAD 7 : Generalized Anxiety Score 03/15/2017 11/02/2016 10/08/2016 09/30/2016  Nervous, Anxious, on Edge 1 1 2 2   Control/stop worrying 0 1 2 1   Worry too much - different things 1 1 1 1   Trouble relaxing 0 1 2 0  Restless 1 0 1 0  Easily annoyed or irritable 0 0 1 0  Afraid - awful might happen 0 1 1 0  Total GAD 7 Score 3 5 10 4     Lab Results  Component Value Date   HGBA1C 5.70 08/15/2015     ASSESSMENT AND PLAN: 1. Essential hypertension -At goal. Continue current medications  2. Seasonal allergic rhinitis due to pollen -doing okay on just Xyzal  3. Morbid obesity due to excess calories (HCC) -Counseling given on the importance of regular aerobic exercise and healthy eating habits. Printed material given on healthy eating  4. Tetanus, diphtheria, and acellular pertussis (Tdap) vaccination declined -will consider on next visit   Patient was given the opportunity to ask questions.  Patient verbalized understanding of the plan and was able to repeat key elements of the plan.   No orders of the defined types were placed in this encounter.    Requested Prescriptions    No prescriptions requested or ordered in this encounter    Return in about 4 months (around 07/15/2017).  Jonah Blue, MD, FACP

## 2017-03-15 NOTE — Patient Instructions (Signed)

## 2017-03-16 MED FILL — SULFAMETHOXAZOLE-TMP DS TAB: 800-160 | 7 days supply | Qty: 14 | Fill #0

## 2017-04-09 ENCOUNTER — Encounter: Payer: Self-pay | Admitting: Internal Medicine

## 2017-04-10 ENCOUNTER — Other Ambulatory Visit: Payer: Self-pay | Admitting: Internal Medicine

## 2017-04-10 MED ORDER — MUPIROCIN 2 % EX OINT: TOPICAL_OINTMENT | CUTANEOUS | 0 refills | Status: DC

## 2017-04-11 MED FILL — MUPIROCIN 2% OINTMENT: 2 | 5 days supply | Qty: 22 | Fill #0

## 2017-06-27 ENCOUNTER — Encounter: Payer: Self-pay | Admitting: Internal Medicine

## 2017-06-28 NOTE — Telephone Encounter (Signed)
Patient mychart response

## 2017-06-29 ENCOUNTER — Other Ambulatory Visit: Payer: Self-pay | Admitting: Internal Medicine

## 2017-06-29 DIAGNOSIS — I10 Essential (primary) hypertension: Secondary | ICD-10-CM

## 2017-06-29 MED ORDER — AMLODIPINE BESYLATE 10 MG PO TABS: 10.0000 mg | ORAL_TABLET | Freq: Every day | ORAL | 1 refills | Status: DC

## 2017-06-29 MED ORDER — LISINOPRIL 40 MG PO TABS: 40.0000 mg | ORAL_TABLET | Freq: Every day | ORAL | 1 refills | Status: DC

## 2017-07-15 ENCOUNTER — Ambulatory Visit: Payer: BC Managed Care – PPO | Admitting: Internal Medicine

## 2017-09-19 ENCOUNTER — Ambulatory Visit: Payer: BC Managed Care – PPO | Admitting: Internal Medicine

## 2017-10-03 ENCOUNTER — Ambulatory Visit: Payer: BC Managed Care – PPO | Admitting: Internal Medicine

## 2017-10-13 ENCOUNTER — Telehealth: Payer: BC Managed Care – PPO | Admitting: Family

## 2017-10-13 DIAGNOSIS — J329 Chronic sinusitis, unspecified: Secondary | ICD-10-CM

## 2017-10-13 DIAGNOSIS — B9689 Other specified bacterial agents as the cause of diseases classified elsewhere: Secondary | ICD-10-CM

## 2017-10-13 MED ORDER — AMOXICILLIN-POT CLAVULANATE 875-125 MG PO TABS: 1.0000 | ORAL_TABLET | Freq: Two times a day (BID) | ORAL | 0 refills | Status: AC

## 2017-10-13 NOTE — Progress Notes (Signed)

## 2018-02-09 ENCOUNTER — Telehealth: Payer: BC Managed Care – PPO | Admitting: Physician Assistant

## 2018-02-09 DIAGNOSIS — B9689 Other specified bacterial agents as the cause of diseases classified elsewhere: Secondary | ICD-10-CM

## 2018-02-09 DIAGNOSIS — J019 Acute sinusitis, unspecified: Secondary | ICD-10-CM

## 2018-02-09 MED ORDER — AMOXICILLIN-POT CLAVULANATE 875-125 MG PO TABS: 1.0000 | ORAL_TABLET | Freq: Two times a day (BID) | ORAL | 0 refills | Status: DC

## 2018-02-09 NOTE — Progress Notes (Signed)

## 2018-03-01 ENCOUNTER — Telehealth: Payer: BC Managed Care – PPO | Admitting: Family

## 2018-03-01 DIAGNOSIS — J329 Chronic sinusitis, unspecified: Secondary | ICD-10-CM

## 2018-03-01 DIAGNOSIS — B9789 Other viral agents as the cause of diseases classified elsewhere: Secondary | ICD-10-CM

## 2018-03-01 MED ORDER — FLUTICASONE PROPIONATE 50 MCG/ACT NA SUSP: 2.0000 | Freq: Every day | NASAL | 6 refills | Status: DC

## 2018-03-01 NOTE — Progress Notes (Signed)

## 2018-03-08 ENCOUNTER — Telehealth: Payer: BC Managed Care – PPO | Admitting: Family

## 2018-03-08 DIAGNOSIS — J019 Acute sinusitis, unspecified: Secondary | ICD-10-CM

## 2018-03-08 MED ORDER — AMOXICILLIN-POT CLAVULANATE 875-125 MG PO TABS: 1.0000 | ORAL_TABLET | Freq: Two times a day (BID) | ORAL | 0 refills | Status: DC

## 2018-03-08 NOTE — Progress Notes (Signed)

## 2018-03-12 ENCOUNTER — Other Ambulatory Visit: Payer: Self-pay | Admitting: Internal Medicine

## 2018-03-12 DIAGNOSIS — I10 Essential (primary) hypertension: Secondary | ICD-10-CM

## 2018-04-11 ENCOUNTER — Other Ambulatory Visit: Payer: Self-pay | Admitting: Internal Medicine

## 2018-04-11 DIAGNOSIS — I10 Essential (primary) hypertension: Secondary | ICD-10-CM

## 2018-04-24 ENCOUNTER — Other Ambulatory Visit: Payer: Self-pay | Admitting: Internal Medicine

## 2018-04-24 DIAGNOSIS — I10 Essential (primary) hypertension: Secondary | ICD-10-CM

## 2018-05-22 ENCOUNTER — Encounter: Payer: Self-pay | Admitting: Internal Medicine

## 2018-05-22 ENCOUNTER — Ambulatory Visit: Payer: BC Managed Care – PPO | Attending: Internal Medicine | Admitting: Internal Medicine

## 2018-05-22 VITALS — BP 132/65 | HR 98 | Temp 99.3°F | Resp 16 | Wt 347.2 lb

## 2018-05-22 DIAGNOSIS — I10 Essential (primary) hypertension: Secondary | ICD-10-CM | POA: Diagnosis not present

## 2018-05-22 DIAGNOSIS — F411 Generalized anxiety disorder: Secondary | ICD-10-CM | POA: Insufficient documentation

## 2018-05-22 DIAGNOSIS — Z79899 Other long term (current) drug therapy: Secondary | ICD-10-CM | POA: Insufficient documentation

## 2018-05-22 DIAGNOSIS — D649 Anemia, unspecified: Secondary | ICD-10-CM

## 2018-05-22 DIAGNOSIS — Z2821 Immunization not carried out because of patient refusal: Secondary | ICD-10-CM | POA: Insufficient documentation

## 2018-05-22 DIAGNOSIS — Z8249 Family history of ischemic heart disease and other diseases of the circulatory system: Secondary | ICD-10-CM | POA: Insufficient documentation

## 2018-05-22 DIAGNOSIS — Z6841 Body Mass Index (BMI) 40.0 and over, adult: Secondary | ICD-10-CM | POA: Insufficient documentation

## 2018-05-22 DIAGNOSIS — L301 Dyshidrosis [pompholyx]: Secondary | ICD-10-CM | POA: Insufficient documentation

## 2018-05-22 DIAGNOSIS — J309 Allergic rhinitis, unspecified: Secondary | ICD-10-CM | POA: Insufficient documentation

## 2018-05-22 MED ORDER — LISINOPRIL 40 MG PO TABS: 40.0000 mg | ORAL_TABLET | Freq: Every day | ORAL | 11 refills | Status: DC

## 2018-05-22 MED ORDER — AMLODIPINE BESYLATE 10 MG PO TABS: 10.0000 mg | ORAL_TABLET | Freq: Every day | ORAL | 11 refills | Status: DC

## 2018-05-22 NOTE — Progress Notes (Signed)
Patient ID: Peter Hahn, male    DOB: Jan 24, 1981  MRN: [id number]  CC: Hypertension   Subjective: Peter Hahn is a 37 y.o. male who presents for chronic ds mangement His concerns today include:  Pt with hx of HTN, morbid obesity, gen anxiety, allergic rhinitis and eczema.  HYPERTENSION Currently taking: see medication list Med Adherence: [x]  Yes    []  No Medication side effects: []  Yes    [x]  No Adherence with salt restriction: [x]  Yes    []  No Home Monitoring?: [x]  Yes    []  No Monitoring Frequency: QOD with wrist cuff Home BP results range: SBP mid to low 120s  DBP 70s SOB? []  Yes    [x]  No Chest Pain?: []  Yes    [x]  No Leg swelling?: []  Yes    [x]  No Headaches?: []  Yes    [x]  No Dizziness? []  Yes    [x]  No Comments:   Obesity:  Keeps tract of steps on his mobile phone.  Walks about 3 miles while at work.  He also walks with his wife every Saturday -loss about 10 lbs since last seen 03/2017 -drinking more water.  He has cut out eating fast foods.  Wife meal prep for the wk.   Patient Active Problem List   Diagnosis Date Noted  . Eczema 08/15/2015  . 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 Generalized anxiety disorder 10/04/2014  . HTN (hypertension) 08/26/2014  . Hypocalcemia 08/26/2014  . Morbid obesity (HCC) 08/26/2014  . Allergic rhinitis 08/26/2014  . Pseudohypoparathyroidism      Current Outpatient Medications on File Prior to Visit  Medication Sig Dispense Refill  . acetaminophen (TYLENOL) 325 MG tablet Take 2 tablets (650 mg total) by mouth every 6 (six) hours as needed. 60 tablet 0  . calcium citrate (CALCITRATE - DOSED IN MG ELEMENTAL CALCIUM) 950 MG tablet Take 2 tablets (400 mg of elemental calcium total) by mouth 4 (four) times daily. 270 tablet 1  . fluticasone (FLONASE) 50 MCG/ACT nasal spray PLACE 2 SPRAYS INTO BOTH NOSTRILS DAILY. 16 g 5  . fluticasone (FLONASE) 50 MCG/ACT nasal spray Place 2 sprays into both nostrils daily. 16 g 6  . levocetirizine  (XYZAL) 5 MG tablet Take 1 tablet (5 mg total) by mouth every evening. 30 tablet 5   No current facility-administered medications on file prior to visit.     Allergies  Allergen Reactions  . Diltiazem     Body aches, chills  . Pollen Extract     Sneezing, headaches    Social History   Socioeconomic History  . Marital status: Married    Spouse name: Not on file  . Number of children: 0  . Years of education: Not on file  . Highest education level: Not on file  Occupational History  . Not on file  Social Needs  . Financial resource strain: Not on file  . Food insecurity:    Worry: Not on file    Inability: Not on file  . Transportation needs:    Medical: Not on file    Non-medical: Not on file  Tobacco Use  . Smoking status: Never Smoker  . Smokeless tobacco: Never Used  Substance and Sexual Activity  . Alcohol use: No    Alcohol/week: 0.0 standard drinks  . Drug use: No    Frequency: 1.0 times per week  . Sexual activity: Yes    Partners: Female    Birth control/protection: Condom  Lifestyle  . Physical activity:  Days per week: Not on file    Minutes per session: Not on file  . Stress: Not on file  Relationships  . Social connections:    Talks on phone: Not on file    Gets together: Not on file    Attends religious service: Not on file    Active member of club or organization: Not on file    Attends meetings of clubs or organizations: Not on file    Relationship status: Not on file  . Intimate partner violence:    Fear of current or ex partner: Not on file    Emotionally abused: Not on file    Physically abused: Not on file    Forced sexual activity: Not on file  Other Topics Concern  . Not on file  Social History Narrative  . Not on file    Family History  Problem Relation Age of Onset  . Diabetes Mother   . Diabetes Father   . Hypertension Father   . Hypertension Paternal Grandmother     No past surgical history on file.  ROS: Review of  Systems Neg except as above PHYSICAL EXAM: BP 132/65   Pulse 98   Temp 99.3 F (37.4 C) (Oral)   Resp 16   Wt (!) 347 lb 3.2 oz (157.5 kg)   SpO2 100%   BMI 49.82 kg/m   Wt Readings from Last 3 Encounters:  05/22/18 (!) 347 lb 3.2 oz (157.5 kg)  03/15/17 (!) 357 lb 12.8 oz (162.3 kg)  11/02/16 (!) 356 lb 12.8 oz (161.8 kg)  manual [id number]  Physical Exam General appearance - alert, well appearing, morbidly obese maleand in no distress Mental status - normal mood, behavior, speech, dress, motor activity, and thought processes Neck - supple, no significant adenopathy Chest - clear to auscultation, no wheezes, rales or rhonchi, symmetric air entry Heart - normal rate, regular rhythm, normal S1, S2, no murmurs, rubs, clicks or gallops Extremities - peripheral pulses normal, no pedal edema, no clubbing or cyanosis   ASSESSMENT AND PLAN: 1. Essential hypertension At goal. Continue current meds and DASH - Comprehensive metabolic panel - Lipid panel - CBC - lisinopril (PRINIVIL,ZESTRIL) 40 MG tablet; Take 1 tablet (40 mg total) by mouth daily.  Dispense: 30 tablet; Refill: 11 - amLODipine (NORVASC) 10 MG tablet; Take 1 tablet (10 mg total) by mouth daily.  Dispense: 30 tablet; Refill: 11  2. Morbid obesity due to excess calories (HCC) Commended him on weight loss so far.  Encouraged him to stay active.  Commended him on changes made in his diet.  Encouraged him to keep up the good works.  3. Influenza vaccination declined 4. Tetanus, diphtheria, and acellular pertussis (Tdap) vaccination declined   Patient was given the opportunity to ask questions.  Patient verbalized understanding of the plan and was able to repeat key elements of the plan.   Orders Placed This Encounter  Procedures  . Comprehensive metabolic panel  . Lipid panel  . CBC     Requested Prescriptions   Signed Prescriptions Disp Refills  . lisinopril (PRINIVIL,ZESTRIL) 40 MG tablet 30 tablet 11    Sig:  Take 1 tablet (40 mg total) by mouth daily.  Marland Kitchen amLODipine (NORVASC) 10 MG tablet 30 tablet 11    Sig: Take 1 tablet (10 mg total) by mouth daily.    Return in about 5 months (around 10/21/2018).  Jonah Blue, MD, FACP

## 2018-05-22 NOTE — Patient Instructions (Signed)

## 2018-05-23 LAB — COMPREHENSIVE METABOLIC PANEL
ALT: 21 IU/L (ref 0–44)
AST: 20 IU/L (ref 0–40)
Albumin/Globulin Ratio: 1.1 — ABNORMAL LOW (ref 1.2–2.2)
Albumin: 3.9 g/dL (ref 3.5–5.5)
Alkaline Phosphatase: 89 IU/L (ref 39–117)
BILIRUBIN TOTAL: 0.2 mg/dL (ref 0.0–1.2)
BUN/Creatinine Ratio: 13 (ref 9–20)
BUN: 13 mg/dL (ref 6–20)
CALCIUM: 7.8 mg/dL — AB (ref 8.7–10.2)
CHLORIDE: 98 mmol/L (ref 96–106)
CO2: 22 mmol/L (ref 20–29)
CREATININE: 1.03 mg/dL (ref 0.76–1.27)
GFR calc Af Amer: 105 mL/min/{1.73_m2} (ref >59)
GFR calc non Af Amer: 91 mL/min/{1.73_m2} (ref >59)
GLUCOSE: 87 mg/dL (ref 65–99)
Globulin, Total: 3.6 g/dL (ref 1.5–4.5)
Potassium: 4.4 mmol/L (ref 3.5–5.2)
Sodium: 138 mmol/L (ref 134–144)
TOTAL PROTEIN: 7.5 g/dL (ref 6.0–8.5)

## 2018-05-23 LAB — LIPID PANEL
CHOLESTEROL TOTAL: 195 mg/dL (ref 100–199)
Chol/HDL Ratio: 5.6 ratio — ABNORMAL HIGH (ref 0.0–5.0)
HDL: 35 mg/dL — AB (ref >39)
LDL CALC: 129 mg/dL — AB (ref 0–99)
Triglycerides: 156 mg/dL — ABNORMAL HIGH (ref 0–149)
VLDL CHOLESTEROL CAL: 31 mg/dL (ref 5–40)

## 2018-05-23 LAB — CBC
Hematocrit: 38.7 % (ref 37.5–51.0)
Hemoglobin: 12.7 g/dL — ABNORMAL LOW (ref 13.0–17.7)
MCH: 29.7 pg (ref 26.6–33.0)
MCHC: 32.8 g/dL (ref 31.5–35.7)
MCV: 91 fL (ref 79–97)
PLATELETS: 360 10*3/uL (ref 150–450)
RBC: 4.27 x10E6/uL (ref 4.14–5.80)
RDW: 14.5 % (ref 12.3–15.4)
WBC: 12.1 10*3/uL — ABNORMAL HIGH (ref 3.4–10.8)

## 2018-05-24 ENCOUNTER — Other Ambulatory Visit: Payer: Self-pay | Admitting: Internal Medicine

## 2018-05-24 MED ORDER — ATORVASTATIN CALCIUM 10 MG PO TABS: 10.0000 mg | ORAL_TABLET | Freq: Every day | ORAL | 3 refills | Status: DC

## 2018-05-24 NOTE — Addendum Note (Signed)
Addended by: Jonah Blue B on: 05/24/2018 11:07 AM   Modules accepted: Orders

## 2018-05-24 NOTE — Progress Notes (Signed)
Results for orders placed or performed in visit on 05/22/18  Comprehensive metabolic panel  Result Value Ref Range   Glucose 87 65 - 99 mg/dL   BUN 13 6 - 20 mg/dL   Creatinine, Ser 1.61 0.76 - 1.27 mg/dL   GFR calc non Af Amer 91 >59 mL/min/1.73   GFR calc Af Amer 105 >59 mL/min/1.73   BUN/Creatinine Ratio 13 9 - 20   Sodium 138 134 - 144 mmol/L   Potassium 4.4 3.5 - 5.2 mmol/L   Chloride 98 96 - 106 mmol/L   CO2 22 20 - 29 mmol/L   Calcium 7.8 (L) 8.7 - 10.2 mg/dL   Total Protein 7.5 6.0 - 8.5 g/dL   Albumin 3.9 3.5 - 5.5 g/dL   Globulin, Total 3.6 1.5 - 4.5 g/dL   Albumin/Globulin Ratio 1.1 (L) 1.2 - 2.2   Bilirubin Total 0.2 0.0 - 1.2 mg/dL   Alkaline Phosphatase 89 39 - 117 IU/L   AST 20 0 - 40 IU/L   ALT 21 0 - 44 IU/L  Lipid panel  Result Value Ref Range   Cholesterol, Total 195 100 - 199 mg/dL   Triglycerides 096 (H) 0 - 149 mg/dL   HDL 35 (L) >04 mg/dL   VLDL Cholesterol Cal 31 5 - 40 mg/dL   LDL Calculated 540 (H) 0 - 99 mg/dL   Chol/HDL Ratio 5.6 (H) 0.0 - 5.0 ratio  CBC  Result Value Ref Range   WBC 12.1 (H) 3.4 - 10.8 x10E3/uL   RBC 4.27 4.14 - 5.80 x10E6/uL   Hemoglobin 12.7 (L) 13.0 - 17.7 g/dL   Hematocrit 98.1 19.1 - 51.0 %   MCV 91 79 - 97 fL   MCH 29.7 26.6 - 33.0 pg   MCHC 32.8 31.5 - 35.7 g/dL   RDW 47.8 29.5 - 62.1 %   Platelets 360 150 - 450 x10E3/uL   Will add iron levels and Vit D level.

## 2018-05-26 LAB — IRON,TIBC AND FERRITIN PANEL
Ferritin: 134 ng/mL (ref 30–400)
IRON SATURATION: 17 % (ref 15–55)
IRON: 43 ug/dL (ref 38–169)
TIBC: 247 ug/dL — AB (ref 250–450)
UIBC: 204 ug/dL (ref 111–343)

## 2018-05-26 LAB — SPECIMEN STATUS REPORT

## 2018-05-26 LAB — VITAMIN D 25 HYDROXY (VIT D DEFICIENCY, FRACTURES): Vit D, 25-Hydroxy: 35.2 ng/mL (ref 30.0–100.0)

## 2018-05-31 ENCOUNTER — Telehealth: Payer: Self-pay

## 2018-05-31 ENCOUNTER — Telehealth: Payer: Self-pay | Admitting: Internal Medicine

## 2018-05-31 NOTE — Telephone Encounter (Signed)
Contacted pt to go over lab results pt didn't answer lvm asking pt to give me a call at his earliest convenience  

## 2018-05-31 NOTE — Telephone Encounter (Signed)
Returned pt call pt didn't answer lvm  

## 2018-05-31 NOTE — Telephone Encounter (Signed)
Patient called back to get results. Please follow up with patient. °

## 2018-08-29 ENCOUNTER — Encounter: Payer: Self-pay | Admitting: Internal Medicine

## 2018-08-29 DIAGNOSIS — E209 Hypoparathyroidism, unspecified: Secondary | ICD-10-CM

## 2018-08-29 NOTE — Telephone Encounter (Signed)
Script request via [employer]

## 2018-08-30 NOTE — Telephone Encounter (Signed)
Pt sent me a Mychart message requesting referral to an endocrinologist.  Pt with low Calcium level.  He reports being diagnosed with hypoparathyroidism at age 38.  Referral submitted.

## 2018-09-13 ENCOUNTER — Ambulatory Visit: Payer: BC Managed Care – PPO | Admitting: Internal Medicine

## 2018-10-03 ENCOUNTER — Ambulatory Visit: Payer: BC Managed Care – PPO | Admitting: Internal Medicine

## 2018-10-03 NOTE — Progress Notes (Deleted)
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 is a 38 y.o. male with a past medical history of Pre-diabetes, . The patient presented for initial endocrinology clinic visit on 10/03/2018 for consultative assistance with his ***.   ***  HISTORY:  Past Medical History:  Past Medical History:  Diagnosis Date  . Allergy    since birth   . Hypertension 2006   first told his BP was high, never treated   . Obesity   . Pseudohypoparathyroidism      Past Surgical History: No past surgical history on file.   Social History:  reports that he has never smoked. He has never used smokeless tobacco. He reports that he does not drink alcohol or use drugs.  Family History: family history includes Diabetes in his father and mother; Hypertension in his father and paternal grandmother.   HOME MEDICATIONS: Allergies as of 10/03/2018      Reactions   Diltiazem    Body aches, chills   Pollen Extract    Sneezing, headaches      Medication List       Accurate as of October 03, 2018  8:13 AM. Always use your most recent med list.        acetaminophen 325 MG tablet Commonly known as:  TYLENOL Take 2 tablets (650 mg total) by mouth every 6 (six) hours as needed.   amLODipine 10 MG tablet Commonly known as:  NORVASC Take 1 tablet (10 mg total) by mouth daily.   atorvastatin 10 MG tablet Commonly known as:  LIPITOR Take 1 tablet (10 mg total) by mouth daily.   calcium citrate 950 MG tablet Commonly known as:  CALCITRATE - dosed in mg elemental calcium Take 2 tablets (400 mg of elemental calcium total) by mouth 4 (four) times daily.   fluticasone 50 MCG/ACT nasal spray Commonly known as:  FLONASE PLACE 2 SPRAYS INTO BOTH NOSTRILS DAILY.   fluticasone 50 MCG/ACT nasal  spray Commonly known as:  FLONASE Place 2 sprays into both nostrils daily.   levocetirizine 5 MG tablet Commonly known as:  XYZAL Take 1 tablet (5 mg total) by mouth every evening.   lisinopril 40 MG tablet Commonly known as:  PRINIVIL,ZESTRIL Take 1 tablet (40 mg total) by mouth daily.         REVIEW OF SYSTEMS: A comprehensive ROS was conducted with the patient and is negative except as per HPI and below:  ROS     OBJECTIVE:  VS: There were no vitals taken for this visit.   Wt Readings from Last 3 Encounters:  05/22/18 (!) 347 lb 3.2 oz (157.5 kg)  03/15/17 (!) 357 lb 12.8 oz (162.3 kg)  11/02/16 (!) 356 lb 12.8 oz (161.8 kg)     EXAM: General: Pt appears well and is in NAD  Hydration: Well-hydrated with moist mucous membranes and good skin turgor  Eyes: External eye exam normal without stare, lid lag or exophthalmos.  EOM intact.  PERRL.  Ears, Nose, Throat: Hearing: Grossly intact bilaterally Dental: Good dentition  Throat: Clear without mass, erythema or exudate  Neck: General: Supple without adenopathy. Thyroid: Thyroid size normal.  No goiter or nodules appreciated. No thyroid bruit.  Lungs: Clear with good BS bilat with no  rales, rhonchi, or wheezes  Heart: Auscultation: RRR.  Abdomen: Normoactive bowel sounds, soft, nontender, without masses or organomegaly palpable  Extremities: Gait and station: Normal gait  Digits and nails: No clubbing, cyanosis, petechiae, or nodes Head and neck: Normal alignment and mobility BL UE: Normal ROM and strength. BL LE: No pretibial edema normal ROM and strength.  Skin: Hair: Texture and amount normal with gender appropriate distribution Skin Inspection: No rashes, acanthosis nigricans/skin tags. No lipohypertrophy Skin Palpation: Skin temperature, texture, and thickness normal to palpation  Neuro: Cranial nerves: II - XII grossly intact  Cerebellar: Normal coordination and movement; no tremor Motor: Normal strength  throughout DTRs: 2+ and symmetric in UE without delay in relaxation phase  Mental Status: Judgment, insight: Intact Orientation: Oriented to time, place, and person Memory: Intact for recent and remote events Mood and affect: No depression, anxiety, or agitation     DATA REVIEWED: ***  Results for THERRON, SCHORSCH (MRN [id number]) as of 10/03/2018 08:14  Ref. Range 09/30/2016 14:11  TSH Latest Ref Range: 0.40 - 4.50 mIU/L 2.19   Results for DANNIE, BARRESI (MRN [id number]) as of 10/03/2018 08:14  Ref. Range 05/22/2018 15:48  Iron Latest Ref Range: 38 - 169 ug/dL 43  UIBC Latest Ref Range: 111 - 343 ug/dL 970  TIBC Latest Ref Range: 250 - 450 ug/dL 263 (L)  Ferritin Latest Ref Range: 30 - 400 ng/mL 134  Iron Saturation Latest Ref Range: 15 - 55 % 17   ASSESSMENT/PLAN/RECOMMENDATIONS:   1. ***    Medications :  Signed electronically by: Lyndle Herrlich, MD  [facility] Endocrinology  [facility] [address] [place], [place] [address] Phone: [phone] FAX: [phone]   CC: Marcine Matar, MD [address] [place] [address] Phone: [phone] Fax: [phone]   Return to Endocrinology clinic as below: Future Appointments  Date Time Provider Department Center  10/03/2018  9:10 AM Abdur Hoglund, Konrad Dolores, MD [id number] None

## 2019-01-11 ENCOUNTER — Other Ambulatory Visit: Payer: Self-pay | Admitting: Family

## 2019-05-17 ENCOUNTER — Encounter: Payer: Self-pay | Admitting: Internal Medicine

## 2019-05-17 ENCOUNTER — Other Ambulatory Visit: Payer: Self-pay | Admitting: Internal Medicine

## 2019-05-17 ENCOUNTER — Ambulatory Visit: Payer: BC Managed Care – PPO | Attending: Internal Medicine | Admitting: Internal Medicine

## 2019-05-17 DIAGNOSIS — E209 Hypoparathyroidism, unspecified: Secondary | ICD-10-CM

## 2019-05-17 DIAGNOSIS — E782 Mixed hyperlipidemia: Secondary | ICD-10-CM

## 2019-05-17 DIAGNOSIS — I10 Essential (primary) hypertension: Secondary | ICD-10-CM

## 2019-05-17 MED ORDER — ATORVASTATIN CALCIUM 10 MG PO TABS: 10.0000 mg | ORAL_TABLET | Freq: Every day | ORAL | 6 refills | Status: DC

## 2019-05-17 MED ORDER — AMLODIPINE BESYLATE 10 MG PO TABS: 10.0000 mg | ORAL_TABLET | Freq: Every day | ORAL | 11 refills | Status: DC

## 2019-05-17 MED ORDER — LISINOPRIL 40 MG PO TABS: 40.0000 mg | ORAL_TABLET | Freq: Every day | ORAL | 11 refills | Status: DC

## 2019-05-17 NOTE — Progress Notes (Signed)
Virtual Visit via Telephone Note Due to current restrictions/limitations of in-office visits due to the COVID-19 pandemic and his work schedule, this scheduled clinical appointment was converted to a telehealth visit  I connected with Peter Hahn on 05/17/19 at 4:55 p.m by telephone and verified that I am speaking with the correct person using two identifiers. I am in my office.  The patient is at home.  Only the patient and myself participated in this encounter.  I discussed the limitations, risks, security and privacy concerns of performing an evaluation and management service by telephone and the availability of in person appointments. I also discussed with the patient that there may be a patient responsible charge related to this service. The patient expressed understanding and agreed to proceed.  History of Present Illness: Pt with hx of HTN, morbid obesity, gen anxiety, allergic rhinitis, hypoparathyroid and eczema.  Last seen 05/2018.  Purpose of today's visit is chronic ds management  HYPERTENSION Currently taking: see medication list -Norvasc and Lisinopril Med Adherence: [x]  Yes    []  No Medication side effects: []  Yes    [x]  No Adherence with salt restriction: [x]  Yes    []  No Home Monitoring?: [x]  Yes    []  No Monitoring Frequency: QOD Home BP results range:  127/78, SOB? []  Yes    [x]  No Chest Pain?: []  Yes    [x]  No Leg swelling?: []  Yes    [x]  No Headaches?: []  Yes    [x]  No Dizziness? []  Yes    [x]  No Comments:   HL:  Out of Lipitor x several mths  Hypoparathyroid:  Requested referral to endocrine last yr.  However, he tells me he missed the appt. request that referral be resubmitted.  Obesity:  Walking 3 x a wk for 15 mins Not eating out much due to COVID.  Eating more home cooked meals and greens.  He has loss some weight because his close now fits much looser on him.  He does not have a scale to determine how much he has loss.  HM: He tells me that he plans to get  his flu vaccine at [facility]. Outpatient Encounter Medications as of 05/17/2019  Medication Sig  . acetaminophen (TYLENOL) 325 MG tablet Take 2 tablets (650 mg total) by mouth every 6 (six) hours as needed.  amLODipine (NORVASC) 10 MG tablet Take 1 tablet (10 mg total) by mouth daily.  atorvastatin (LIPITOR) 10 MG tablet Take 1 tablet (10 mg total) by mouth daily.  . calcium citrate (CALCITRATE - DOSED IN MG ELEMENTAL CALCIUM) 950 MG tablet Take 2 tablets (400 mg of elemental calcium total) by mouth 4 (four) times daily.  . fluticasone (FLONASE) 50 MCG/ACT nasal spray PLACE 2 SPRAYS INTO BOTH NOSTRILS DAILY.  . fluticasone (FLONASE) 50 MCG/ACT nasal spray Place 2 sprays into both nostrils daily.  levocetirizine (XYZAL) 5 MG tablet Take 1 tablet (5 mg total) by mouth every evening.  lisinopril (PRINIVIL,ZESTRIL) 40 MG tablet Take 1 tablet (40 mg total) by mouth daily.   No facility-administered encounter medications on file as of 05/17/2019.       Observations/Objective: No direct observation done as this was a telephone encounter  Assessment and Plan: 1. Essential hypertension Reported home blood pressure reading at goal.  He will continue current dose of amlodipine and lisinopril.  Continue DASH diet. - amLODipine (NORVASC) 10 MG tablet; Take 1 tablet (10 mg total) by mouth daily.  Dispense: 30 tablet; Refill: 11 - lisinopril (  ZESTRIL) 40 MG tablet; Take 1 tablet (40 mg total) by mouth daily.  Dispense: 30 tablet; Refill: 11 - CBC; Future - Lipid panel; Future - Comprehensive metabolic panel; Future  2. Hypoparathyroidism, unspecified hypoparathyroidism type ([place]) - Ambulatory referral to Endocrinology - Parathyroid hormone, intact (no Ca); Future  3. Mixed hyperlipidemia Refill given on atorvastatin.  4. Morbid obesity due to excess calories ([place]) Commended him on exercising regularly and changing his eating habits.  Encouraged him to try to get up to 30 minutes of exercise 3  times a week.  Follow Up Instructions: Follow-up in 3 months in person.   I discussed the assessment and treatment plan with the patient. The patient was provided an opportunity to ask questions and all were answered. The patient agreed with the plan and demonstrated an understanding of the instructions.   The patient was advised to call back or seek an in-person evaluation if the symptoms worsen or if the condition fails to improve as anticipated.  I provided 10 minutes of non-face-to-face time during this encounter.   Karle Plumber, MD

## 2019-05-17 NOTE — Telephone Encounter (Signed)
Please refill after tele visit today

## 2019-07-09 ENCOUNTER — Ambulatory Visit: Payer: BC Managed Care – PPO | Attending: Internal Medicine

## 2019-07-09 ENCOUNTER — Other Ambulatory Visit: Payer: Self-pay

## 2019-07-09 DIAGNOSIS — E209 Hypoparathyroidism, unspecified: Secondary | ICD-10-CM

## 2019-07-09 DIAGNOSIS — I10 Essential (primary) hypertension: Secondary | ICD-10-CM

## 2019-07-10 LAB — CBC
Hematocrit: 41.5 % (ref 37.5–51.0)
Hemoglobin: 13.8 g/dL (ref 13.0–17.7)
MCH: 30.7 pg (ref 26.6–33.0)
MCHC: 33.3 g/dL (ref 31.5–35.7)
MCV: 92 fL (ref 79–97)
Platelets: 281 10*3/uL (ref 150–450)
RBC: 4.49 x10E6/uL (ref 4.14–5.80)
RDW: 13.4 % (ref 11.6–15.4)
WBC: 9.6 10*3/uL (ref 3.4–10.8)

## 2019-07-10 LAB — COMPREHENSIVE METABOLIC PANEL
ALT: 16 IU/L (ref 0–44)
AST: 17 IU/L (ref 0–40)
Albumin/Globulin Ratio: 1.6 (ref 1.2–2.2)
Albumin: 4.3 g/dL (ref 4.0–5.0)
Alkaline Phosphatase: 86 IU/L (ref 39–117)
BUN/Creatinine Ratio: 10 (ref 9–20)
BUN: 11 mg/dL (ref 6–24)
Bilirubin Total: 0.3 mg/dL (ref 0.0–1.2)
CO2: 26 mmol/L (ref 20–29)
Calcium: 8.4 mg/dL — ABNORMAL LOW (ref 8.7–10.2)
Chloride: 100 mmol/L (ref 96–106)
Creatinine, Ser: 1.07 mg/dL (ref 0.76–1.27)
GFR calc Af Amer: 100 mL/min/{1.73_m2} (ref >59)
GFR calc non Af Amer: 86 mL/min/{1.73_m2} (ref >59)
Globulin, Total: 2.7 g/dL (ref 1.5–4.5)
Glucose: 97 mg/dL (ref 65–99)
Potassium: 4.5 mmol/L (ref 3.5–5.2)
Sodium: 140 mmol/L (ref 134–144)
Total Protein: 7 g/dL (ref 6.0–8.5)

## 2019-07-10 LAB — LIPID PANEL
Chol/HDL Ratio: 4.1 ratio (ref 0.0–5.0)
Cholesterol, Total: 132 mg/dL (ref 100–199)
HDL: 32 mg/dL — ABNORMAL LOW (ref >39)
LDL Chol Calc (NIH): 78 mg/dL (ref 0–99)
Triglycerides: 119 mg/dL (ref 0–149)
VLDL Cholesterol Cal: 22 mg/dL (ref 5–40)

## 2019-07-10 LAB — PARATHYROID HORMONE, INTACT (NO CA): PTH: 66 pg/mL — ABNORMAL HIGH (ref 15–65)

## 2019-08-06 ENCOUNTER — Other Ambulatory Visit: Payer: Self-pay

## 2019-08-07 ENCOUNTER — Ambulatory Visit: Payer: BC Managed Care – PPO | Admitting: Endocrinology

## 2019-08-10 ENCOUNTER — Telehealth: Payer: BC Managed Care – PPO | Admitting: Nurse Practitioner

## 2019-08-10 DIAGNOSIS — J019 Acute sinusitis, unspecified: Secondary | ICD-10-CM | POA: Diagnosis not present

## 2019-08-10 DIAGNOSIS — B9789 Other viral agents as the cause of diseases classified elsewhere: Secondary | ICD-10-CM

## 2019-08-10 MED ORDER — PSEUDOEPH-BROMPHEN-DM 30-2-10 MG/5ML PO SYRP: 5.0000 mL | ORAL_SOLUTION | Freq: Four times a day (QID) | ORAL | 0 refills | Status: AC | PRN

## 2019-08-10 NOTE — Progress Notes (Signed)
We are sorry that you are not feeling well.  Here is how we plan to help!  Based on what you have shared with me it looks like you have sinusitis.  Sinusitis is inflammation and infection in the sinus cavities of the head.  Based on your presentation I believe you most likely have Acute Viral Sinusitis.This is an infection most likely caused by a virus. There is not specific treatment for viral sinusitis other than to help you with the symptoms until the infection runs its course.  You may use an oral decongestant such as Mucinex D or if you have glaucoma or high blood pressure use plain Mucinex. Saline nasal spray help and can safely be used as often as needed for congestion, I have prescribed: Bromfed cough syrup, take one teaspoon every 4 hours as needed for cough.  Continue the Xyzal you have been currently prescribed along with the Flonase.  At this time, your symptoms do not warrant the need for an antibiotic, as it can take up to 7 -10 days for viral infections to improve.  If after that time you continue to have symptoms, please submit another e-visit to determine if antibiotics will be needed.    Some authorities believe that zinc sprays or the use of Echinacea may shorten the course of your symptoms.  Sinus infections are not as easily transmitted as other respiratory infection, however we still recommend that you avoid close contact with loved ones, especially the very young and elderly.  Remember to wash your hands thoroughly throughout the day as this is the number one way to prevent the spread of infection!  Home Care:  Only take medications as instructed by your medical team.  Do not take these medications with alcohol.  A steam or ultrasonic humidifier can help congestion.  You can place a towel over your head and breathe in the steam from hot water coming from a faucet.  Avoid close contacts especially the very young and the elderly.  Cover your mouth when you cough or  sneeze.  Always remember to wash your hands.  Get Help Right Away If:  You develop worsening fever or sinus pain.  You develop a severe head ache or visual changes.  Your symptoms persist after you have completed your treatment plan.  Make sure you  Understand these instructions.  Will watch your condition.  Will get help right away if you are not doing well or get worse.  Your e-visit answers were reviewed by a board certified advanced clinical practitioner to complete your personal care plan.  Depending on the condition, your plan could have included both over the counter or prescription medications.  If there is a problem please reply  once you have received a response from your provider.  Your safety is important to [place].  If you have drug allergies check your prescription carefully.    You can use MyChart to ask questions about today's visit, request a non-urgent call back, or ask for a work or school excuse for 24 hours related to this e-Visit. If it has been greater than 24 hours you will need to follow up with your provider, or enter a new e-Visit to address those concerns.  You will get an e-mail in the next two days asking about your experience.  I hope that your e-visit has been valuable and will speed your recovery. Thank you for using e-visits.  I have spent at least 5 minutes reviewing and documenting in the patient's  chart.

## 2019-08-13 ENCOUNTER — Ambulatory Visit: Payer: BC Managed Care – PPO | Admitting: Endocrinology

## 2019-08-29 ENCOUNTER — Other Ambulatory Visit: Payer: Self-pay

## 2019-08-31 ENCOUNTER — Other Ambulatory Visit: Payer: Self-pay

## 2019-08-31 ENCOUNTER — Encounter: Payer: Self-pay | Admitting: Endocrinology

## 2019-08-31 ENCOUNTER — Ambulatory Visit: Payer: BC Managed Care – PPO | Admitting: Endocrinology

## 2019-08-31 VITALS — BP 132/80 | HR 113 | Ht 70.0 in | Wt 350.8 lb

## 2019-08-31 DIAGNOSIS — E201 Pseudohypoparathyroidism: Secondary | ICD-10-CM | POA: Diagnosis not present

## 2019-08-31 MED ORDER — CALCITRIOL 0.25 MCG PO CAPS: 0.2500 ug | ORAL_CAPSULE | Freq: Every day | ORAL | 5 refills | Status: DC

## 2019-08-31 NOTE — Progress Notes (Signed)
Subjective:    Patient ID: Peter Hahn, male    DOB: 09-Jul-1981, 39 y.o.   MRN: [id number]  HPI Pt is referred by Dr Wynetta Emery, for hypocalcemia.  Pt was dx'ed with pseudohypoparathyroidism at age 46.  He took rocaltrol for approx 9 years.  He then changed to vit D3/Ca++ combined pill, but he does not know the dosage.  he denies h/o the following: bariatric surgery, renal disease, pancreatitis, heart disease, cancer, osteoporosis, malabsorption, eating disorder, bony fx, neck surgery, chelation rx.  He has slight tingling of the fingertips.   Past Medical History:  Diagnosis Date  . Allergy    since birth   . Hypertension 2006   first told his BP was high, never treated   . Obesity   . Pseudohypoparathyroidism     No past surgical history on file.  Social History   Socioeconomic History  . Marital status: Married    Spouse name: Not on file  . Number of children: 0  . Years of education: Not on file  . Highest education level: Not on file  Occupational History  . Not on file  Tobacco Use  . Smoking status: Never Smoker  . Smokeless tobacco: Never Used  Substance and Sexual Activity  . Alcohol use: No    Alcohol/week: 0.0 standard drinks  . Drug use: No    Frequency: 1.0 times per week  . Sexual activity: Yes    Partners: Female    Birth control/protection: Condom  Other Topics Concern  . Not on file  Social History Narrative  . Not on file   Social Determinants of Health   Financial Resource Strain:   . Difficulty of Paying Living Expenses: Not on file  Food Insecurity:   . Worried About Charity fundraiser in the Last Year: Not on file  . Ran Out of Food in the Last Year: Not on file  Transportation Needs:   . Lack of Transportation (Medical): Not on file  . Lack of Transportation (Non-Medical): Not on file  Physical Activity:   . Days of Exercise per Week: Not on file  . Minutes of Exercise per Session: Not on file  Stress:   . Feeling of Stress : Not on  file  Social Connections:   . Frequency of Communication with Friends and Family: Not on file  . Frequency of Social Gatherings with Friends and Family: Not on file  . Attends Religious Services: Not on file  . Active Member of Clubs or Organizations: Not on file  . Attends Archivist Meetings: Not on file  . Marital Status: Not on file  Intimate Partner Violence:   . Fear of Current or Ex-Partner: Not on file  . Emotionally Abused: Not on file  . Physically Abused: Not on file  . Sexually Abused: Not on file    Current Outpatient Medications on File Prior to Visit  Medication Sig Dispense Refill  . acetaminophen (TYLENOL) 325 MG tablet Take 2 tablets (650 mg total) by mouth every 6 (six) hours as needed. 60 tablet 0  . amLODipine (NORVASC) 10 MG tablet Take 1 tablet (10 mg total) by mouth daily. 30 tablet 11  . atorvastatin (LIPITOR) 10 MG tablet Take 1 tablet (10 mg total) by mouth daily. 30 tablet 6  . Calcium Citrate (CITRACAL PO) Take 400 mg by mouth 4 (four) times daily.    . fluticasone (FLONASE) 50 MCG/ACT nasal spray PLACE 2 SPRAYS INTO BOTH NOSTRILS DAILY. (Patient taking  differently: Place 2 sprays into both nostrils as needed. ) 16 g 5  . levocetirizine (XYZAL) 5 MG tablet Take 1 tablet (5 mg total) by mouth every evening. (Patient taking differently: Take 5 mg by mouth daily as needed. ) 30 tablet 5  . lisinopril (ZESTRIL) 40 MG tablet Take 1 tablet (40 mg total) by mouth daily. 30 tablet 11   No current facility-administered medications on file prior to visit.    Allergies  Allergen Reactions  . Diltiazem     Body aches, chills  . Pollen Extract     Sneezing, headaches    Family History  Problem Relation Age of Onset  . Diabetes Mother   . Diabetes Father   . Hypertension Father   . Hypertension Paternal Grandmother     BP 132/80 (BP Location: Right Arm, Patient Position: Sitting, Cuff Size: Large)   Pulse (!) 113   Ht 5\' 10"  (1.778 m)   Wt (!)  lb 12.8 oz (159.1 kg)   SpO2 98%   BMI 50.33 kg/m   Review of Systems denies cramps, n/v, syncope, fatigue, cold intolerance, palpitations, rash, diarrhea, sob, weight loss, fever, and urinary frequency.     Objective:   Physical Exam VS: see vs page GEN: no distress HEAD: head: no deformity eyes: no periorbital swelling, no proptosis external nose and ears are normal NECK: supple, thyroid is not enlarged CHEST WALL: no deformity.  No kyphosis.  LUNGS: clear to auscultation CV: reg rate and rhythm, no murmur MUSCULOSKELETAL: muscle bulk and strength are normal.  no obvious joint swelling.  gait is normal and steady EXTEMITIES: no deformity.  no leg edema NEURO:  cn 2-12 grossly intact.   readily moves all 4's.  sensation is intact to touch on all 4's.   SKIN:  Normal texture and temperature.  No rash or suspicious lesion is seen  NODES:  None palpable at the neck PSYCH: alert, well-oriented.  Does not appear anxious nor depressed.    Lab Results  Component Value Date   PTH 66 (H) 07/09/2019   CALCIUM 8.4 (L) 07/09/2019   CAION 0.79 (L) 10/03/2014   PHOS 4.4 09/26/2014   I have reviewed outside records, and summarized: Pt was noted to have hypocalcemia, and referred here.  HTN, dyslipidemia, obesity, and wellness were also addressed.      Assessment & Plan:  Pseudohypoparathyroidism, new to me.  He declines to check 25-OH vit-d for now.  He needs to resume calcitriol.    Patient Instructions  I have sent a prescription to your pharmacy, to resume the calcitriol.   Please continue the same vitamin-d and calcium pill. In 2 weeks, please redo the blood tests.  Please call first.  Please come back for a follow-up appointment in 3 months.

## 2019-08-31 NOTE — Patient Instructions (Signed)
I have sent a prescription to your pharmacy, to resume the calcitriol.   Please continue the same vitamin-d and calcium pill. In 2 weeks, please redo the blood tests.  Please call first.  Please come back for a follow-up appointment in 3 months.

## 2019-11-02 ENCOUNTER — Other Ambulatory Visit: Payer: Self-pay | Admitting: Internal Medicine

## 2019-11-29 ENCOUNTER — Other Ambulatory Visit: Payer: Self-pay | Admitting: Internal Medicine

## 2019-12-23 ENCOUNTER — Other Ambulatory Visit: Payer: Self-pay | Admitting: Internal Medicine

## 2020-01-07 ENCOUNTER — Encounter: Payer: Self-pay | Admitting: Internal Medicine

## 2020-01-08 ENCOUNTER — Other Ambulatory Visit: Payer: Self-pay | Admitting: Nurse Practitioner

## 2020-01-08 DIAGNOSIS — Z3141 Encounter for fertility testing: Secondary | ICD-10-CM

## 2020-01-18 ENCOUNTER — Other Ambulatory Visit: Payer: Self-pay | Admitting: Internal Medicine

## 2020-01-24 ENCOUNTER — Encounter: Payer: Self-pay | Admitting: Internal Medicine

## 2020-01-25 ENCOUNTER — Encounter: Payer: Self-pay | Admitting: Internal Medicine

## 2020-01-30 NOTE — Telephone Encounter (Signed)
Good Morning  I sent referral to [facility]  Reproductive medicine and patient is aware to contact them .

## 2020-02-23 ENCOUNTER — Ambulatory Visit: Payer: BC Managed Care – PPO

## 2020-02-25 ENCOUNTER — Ambulatory Visit: Payer: BC Managed Care – PPO | Attending: Internal Medicine

## 2020-02-25 DIAGNOSIS — Z23 Encounter for immunization: Secondary | ICD-10-CM

## 2020-02-25 NOTE — Progress Notes (Signed)
   Covid-19 Vaccination Clinic  Name:  Peter Hahn    MRN: [id number] DOB: 25-Dec-1980  02/25/2020  [name] was observed post Covid-19 immunization for 15 minutes without incident. He was provided with Vaccine Information Sheet and instruction to access the V-Safe system.   [name] was instructed to call 911 with any severe reactions post vaccine: Marland Kitchen Difficulty breathing  . Swelling of face and throat  . A fast heartbeat  . A bad rash all over body  . Dizziness and weakness   Immunizations Administered    Name Date Dose VIS Date Route   Pfizer COVID-19 Vaccine 02/25/2020 11:06 AM 0.3 mL 09/26/2018 Intramuscular   Manufacturer: [employer], [employer]   Lot: [id number]   NDC: [address]-9

## 2020-03-18 ENCOUNTER — Ambulatory Visit: Payer: BC Managed Care – PPO

## 2020-05-06 ENCOUNTER — Other Ambulatory Visit: Payer: Self-pay | Admitting: Internal Medicine

## 2020-05-06 DIAGNOSIS — I10 Essential (primary) hypertension: Secondary | ICD-10-CM

## 2020-05-06 NOTE — Telephone Encounter (Signed)
Requested medications are due for refill today?  Yes  Requested medications are on active medication list?  Yes  Last Refill:    Lisinopril  05/17/2019  # 30 with 11 refills  Amlodipine   10//2020  # 30 with 11 refills   Future visit scheduled?  No   Notes to Clinic:  Both medications failed Rx refill protocol due to no valid encounter in the past 6 months.  Last visit was 11 months ago.  Lisinopril also failed Rx refill protocol due to no valid labs in the past 180 days.

## 2020-05-08 ENCOUNTER — Other Ambulatory Visit: Payer: Self-pay

## 2020-05-08 ENCOUNTER — Encounter: Payer: Self-pay | Admitting: Internal Medicine

## 2020-05-08 ENCOUNTER — Ambulatory Visit: Payer: BC Managed Care – PPO | Attending: Internal Medicine | Admitting: Internal Medicine

## 2020-05-08 DIAGNOSIS — E201 Pseudohypoparathyroidism: Secondary | ICD-10-CM

## 2020-05-08 DIAGNOSIS — E782 Mixed hyperlipidemia: Secondary | ICD-10-CM

## 2020-05-08 DIAGNOSIS — I10 Essential (primary) hypertension: Secondary | ICD-10-CM | POA: Diagnosis not present

## 2020-05-08 DIAGNOSIS — Z23 Encounter for immunization: Secondary | ICD-10-CM

## 2020-05-08 MED ORDER — ATORVASTATIN CALCIUM 10 MG PO TABS: 10.0000 mg | ORAL_TABLET | Freq: Every day | ORAL | 6 refills | Status: DC

## 2020-05-08 MED ORDER — AMLODIPINE BESYLATE 10 MG PO TABS: 10.0000 mg | ORAL_TABLET | Freq: Every day | ORAL | 6 refills | Status: DC

## 2020-05-08 MED ORDER — LISINOPRIL 40 MG PO TABS: 40.0000 mg | ORAL_TABLET | Freq: Every day | ORAL | 1 refills | Status: DC

## 2020-05-08 NOTE — Progress Notes (Signed)
Virtual Visit via Telephone Note Due to current restrictions/limitations of in-office visits due to the COVID-19 pandemic, this scheduled clinical appointment was converted to a telehealth visit  I connected with Peter Hahn on 05/08/20 at 3:04 p.m by telephone and verified that I am speaking with the correct person using two identifiers.   I am in my office.  The patient is at home.  Only the patient and myself participated in this encounter.  I discussed the limitations, risks, security and privacy concerns of performing an evaluation and management service by telephone and the availability of in person appointments. I also discussed with the patient that there may be a patient responsible charge related to this service. The patient expressed understanding and agreed to proceed.   History of Present Illness: Pt with hx of HTN, morbid obesity, gen anxiety, allergic rhinitis, hypoparathyroid and eczema.   HYPERTENSION Currently taking: see medication list Med Adherence: [x]  Yes on Norvasc and Lisinopril   Medication side effects: []  Yes    [x]  No Adherence with salt restriction: []  Yes    []  No Home Monitoring?: [x]  Yes    []  No Monitoring Frequency: 3 x a wk Home BP results range:  Reading 2 days ago was 130/82.  On average SBP 120-130, DBP 70-80 SOB? []  Yes    [x]  No Chest Pain?: []  Yes    [x]  No Leg swelling?: []  Yes    [x]  No Headaches?: []  Yes    [x]  No Dizziness? []  Yes    [x]  No Comments:   Hypoparathyroid:  Saw Dr. 08/2019.  He was placed on Rocaltrol.  He is overdue for follow-up.  He plans to call and schedule.   HL:  Ran out of Lipitor.  He was tolerating okay    HM:  Completed the Pfizer vaccine.  Plans to get flu vaccine at [facility] Outpatient Encounter Medications as of 05/08/2020  Medication Sig  . acetaminophen (TYLENOL) 325 MG tablet Take 2 tablets (650 mg total) by mouth every 6 (six) hours as needed.  amLODipine (NORVASC) 10 MG tablet Take 1 tablet (10 mg  total) by mouth daily.  atorvastatin (LIPITOR) 10 MG tablet TAKE 1 TABLET (10 MG TOTAL) BY MOUTH DAILY. MUST HAVE OFFICE VISIT FOR REFILLS  . calcitRIOL (ROCALTROL) 0.25 MCG capsule Take 1 capsule (0.25 mcg total) by mouth daily.  . Calcium Citrate (CITRACAL PO) Take 400 mg by mouth 4 (four) times daily.  . fluticasone (FLONASE) 50 MCG/ACT nasal spray PLACE 2 SPRAYS INTO BOTH NOSTRILS DAILY. (Patient taking differently: Place 2 sprays into both nostrils as needed. )  . levocetirizine (XYZAL) 5 MG tablet Take 1 tablet (5 mg total) by mouth every evening. (Patient taking differently: Take 5 mg by mouth daily as needed. )  . lisinopril (ZESTRIL) 40 MG tablet Take 1 tablet (40 mg total) by mouth daily.   No facility-administered encounter medications on file as of 05/08/2020.      Observations/Objective: Results for orders placed or performed in visit on 07/09/19  Parathyroid hormone, intact (no Ca)  Result Value Ref Range   PTH 66 (H) 15 - 65 pg/mL  Comprehensive metabolic panel  Result Value Ref Range   Glucose 97 65 - 99 mg/dL   BUN 11 6 - 24 mg/dL   Creatinine, Ser 0.76 - 1.27 mg/dL   GFR calc non Af Amer 86 >59 mL/min/1.73   GFR calc Af Amer 100 >59 mL/min/1.73   BUN/Creatinine Ratio 10 9 - 20  Sodium 140 134 - 144 mmol/L   Potassium 4.5 3.5 - 5.2 mmol/L   Chloride 100 96 - 106 mmol/L   CO2 26 20 - 29 mmol/L   Calcium 8.4 (L) 8.7 - 10.2 mg/dL   Total Protein 7.0 6.0 - 8.5 g/dL   Albumin 4.3 4.0 - 5.0 g/dL   Globulin, Total 2.7 1.5 - 4.5 g/dL   Albumin/Globulin Ratio 1.6 1.2 - 2.2   Bilirubin Total 0.3 0.0 - 1.2 mg/dL   Alkaline Phosphatase 86 39 - 117 IU/L   AST 17 0 - 40 IU/L   ALT 16 0 - 44 IU/L  Lipid panel  Result Value Ref Range   Cholesterol, Total 132 100 - 199 mg/dL   Triglycerides 409 0 - 149 mg/dL   HDL 32 (L) >81 mg/dL   VLDL Cholesterol Cal 22 5 - 40 mg/dL   LDL Chol Calc (NIH) 78 0 - 99 mg/dL   Chol/HDL Ratio 4.1 0.0 - 5.0 ratio  CBC  Result Value  Ref Range   WBC 9.6 3.4 - 10.8 x10E3/uL   RBC 4.49 4.14 - 5.80 x10E6/uL   Hemoglobin 13.8 13.0 - 17.7 g/dL   Hematocrit 19.1 47.8 - 51.0 %   MCV 92 79 - 97 fL   MCH 30.7 26.6 - 33.0 pg   MCHC 33.3 31 - 35 g/dL   RDW 29.5 62.1 - 30.8 %   Platelets 281 150 - 450 x10E3/uL     Assessment and Plan: 1. Essential hypertension Reported home readings are at goal.  Continue amlodipine and lisinopril. - CBC; Future - Comprehensive metabolic panel; Future - amLODipine (NORVASC) 10 MG tablet; Take 1 tablet (10 mg total) by mouth daily.  Dispense: 30 tablet; Refill: 6 - lisinopril (ZESTRIL) 40 MG tablet; Take 1 tablet (40 mg total) by mouth daily.  Dispense: 90 tablet; Refill: 1  2. Pseudohypoparathyroidism Patient plans to call and schedule follow-up visit with endocrinology. - Parathyroid hormone, intact (no Ca); Future  3. Mixed hyperlipidemia Refill Lipitor. - Lipid panel; Future - atorvastatin (LIPITOR) 10 MG tablet; Take 1 tablet (10 mg total) by mouth daily.  Dispense: 30 tablet; Refill: 6  4. Need for influenza vaccination Patient plans to get his flu vaccine at an outside pharmacy.  I have requested that he send me a MyChart message after he is receive his flu vaccine and also provide me the information of the dates of his Covid vaccine so that I can update his health maintenance.   Follow Up Instructions: F/u in 4 mths   I discussed the assessment and treatment plan with the patient. The patient was provided an opportunity to ask questions and all were answered. The patient agreed with the plan and demonstrated an understanding of the instructions.   The patient was advised to call back or seek an in-person evaluation if the symptoms worsen or if the condition fails to improve as anticipated.  I provided 11 minutes of non-face-to-face time during this encounter.   Jonah Blue, MD

## 2020-05-27 ENCOUNTER — Encounter: Payer: Self-pay | Admitting: Internal Medicine

## 2020-06-16 ENCOUNTER — Telehealth: Payer: BC Managed Care – PPO | Admitting: Family

## 2020-06-16 DIAGNOSIS — J069 Acute upper respiratory infection, unspecified: Secondary | ICD-10-CM | POA: Diagnosis not present

## 2020-06-16 MED ORDER — BENZONATATE 100 MG PO CAPS: 100.0000 mg | ORAL_CAPSULE | Freq: Three times a day (TID) | ORAL | 0 refills | Status: DC | PRN

## 2020-06-16 MED ORDER — FLUTICASONE PROPIONATE 50 MCG/ACT NA SUSP: 2.0000 | Freq: Every day | NASAL | 6 refills | Status: DC

## 2020-06-16 MED ORDER — ALBUTEROL SULFATE HFA 108 (90 BASE) MCG/ACT IN AERS: 2.0000 | INHALATION_SPRAY | Freq: Four times a day (QID) | RESPIRATORY_TRACT | 0 refills | Status: DC | PRN

## 2020-06-16 NOTE — Addendum Note (Signed)
Addended by: Jannifer Rodney A on: 06/16/2020 03:50 PM   Modules accepted: Orders

## 2020-06-16 NOTE — Progress Notes (Signed)
We are sorry you are not feeling well.  Here is how we plan to help! ° °Based on what you have shared with me, it looks like you may have a viral upper respiratory infection.  Upper respiratory infections are caused by a large number of viruses; however, rhinovirus is the most common cause.  ° °Given your symptoms, you also need to be tested for COVID to rule this out.  ° ° °Symptoms vary from person to person, with common symptoms including sore throat, cough, fatigue or lack of energy and feeling of general discomfort.  A low-grade fever of up to 100.4 may present, but is often uncommon.  Symptoms vary however, and are closely related to a person's age or underlying illnesses.  The most common symptoms associated with an upper respiratory infection are nasal discharge or congestion, cough, sneezing, headache and pressure in the ears and face.  These symptoms usually persist for about 3 to 10 days, but can last up to 2 weeks.  It is important to know that upper respiratory infections do not cause serious illness or complications in most cases.   ° °Upper respiratory infections can be transmitted from person to person, with the most common method of transmission being a person's hands.  The virus is able to live on the skin and can infect other persons for up to 2 hours after direct contact.  Also, these can be transmitted when someone coughs or sneezes; thus, it is important to cover the mouth to reduce this risk.  To keep the spread of the illness at bay, good hand hygiene is very important. ° °This is an infection that is most likely caused by a virus. There are no specific treatments other than to help you with the symptoms until the infection runs its course.  We are sorry you are not feeling well.  Here is how we plan to help! ° ° °For nasal congestion, you may use an oral decongestants such as Mucinex D or if you have glaucoma or high blood pressure use plain Mucinex.  Saline nasal spray or nasal drops can  help and can safely be used as often as needed for congestion.  For your congestion, I have prescribed Fluticasone nasal spray one spray in each nostril twice a day ° °If you do not have a history of heart disease, hypertension, diabetes or thyroid disease, prostate/bladder issues or glaucoma, you may also use Sudafed to treat nasal congestion.  It is highly recommended that you consult with a pharmacist or your primary care physician to ensure this medication is safe for you to take.    ° °If you have a cough, you may use cough suppressants such as Delsym and Robitussin.  If you have glaucoma or high blood pressure, you can also use Coricidin HBP.   °For cough I have prescribed for you A prescription cough medication called Tessalon Perles 100 mg. You may take 1-2 capsules every 8 hours as needed for cough ° °If you have a sore or scratchy throat, use a saltwater gargle- ¼ to ½ teaspoon of salt dissolved in a 4-ounce to 8-ounce glass of warm water.  Gargle the solution for approximately 15-30 seconds and then spit.  It is important not to swallow the solution.  You can also use throat lozenges/cough drops and Chloraseptic spray to help with throat pain or discomfort.  Warm or cold liquids can also be helpful in relieving throat pain. ° °For headache, pain or general discomfort, you can   use Ibuprofen or Tylenol as directed.   °Some authorities believe that zinc sprays or the use of Echinacea may shorten the course of your symptoms. ° ° °HOME CARE °• Only take medications as instructed by your medical team. °• Be sure to drink plenty of fluids. Water is fine as well as fruit juices, sodas and electrolyte beverages. You may want to stay away from caffeine or alcohol. If you are nauseated, try taking small sips of liquids. How do you know if you are getting enough fluid? Your urine should be a pale yellow or almost colorless. °• Get rest. °• Taking a steamy shower or using a humidifier may help nasal congestion and  ease sore throat pain. You can place a towel over your head and breathe in the steam from hot water coming from a faucet. °• Using a saline nasal spray works much the same way. °• Cough drops, hard candies and sore throat lozenges may ease your cough. °• Avoid close contacts especially the very young and the elderly °• Cover your mouth if you cough or sneeze °• Always remember to wash your hands.  ° °GET HELP RIGHT AWAY IF: °• You develop worsening fever. °• If your symptoms do not improve within 10 days °• You develop yellow or green discharge from your nose over 3 days. °• You have coughing fits °• You develop a severe head ache or visual changes. °• You develop shortness of breath, difficulty breathing or start having chest pain °• Your symptoms persist after you have completed your treatment plan ° °MAKE SURE YOU  °· Understand these instructions. °· Will watch your condition. °· Will get help right away if you are not doing well or get worse. ° °Your e-visit answers were reviewed by a board certified advanced clinical practitioner to complete your personal care plan. Depending upon the condition, your plan could have included both over the counter or prescription medications. °Please review your pharmacy choice. If there is a problem, you may call our nursing hot line at and have the prescription routed to another pharmacy. °Your safety is important to us. If you have drug allergies check your prescription carefully.  ° °You can use MyChart to ask questions about today’s visit, request a non-urgent call back, or ask for a work or school excuse for 24 hours related to this e-Visit. If it has been greater than 24 hours you will need to follow up with your provider, or enter a new e-Visit to address those concerns. °You will get an e-mail in the next two days asking about your experience.  I hope that your e-visit has been valuable and will speed your recovery. Thank you for using e-visits. ° °Approximately 5  minutes was spent documenting and reviewing patient's chart.  ° ° ° ° °

## 2020-07-07 ENCOUNTER — Other Ambulatory Visit: Payer: Self-pay | Admitting: Family

## 2020-07-07 ENCOUNTER — Ambulatory Visit: Payer: BC Managed Care – PPO | Admitting: Internal Medicine

## 2020-09-08 ENCOUNTER — Ambulatory Visit: Payer: BC Managed Care – PPO | Admitting: Internal Medicine

## 2020-11-03 ENCOUNTER — Other Ambulatory Visit: Payer: Self-pay | Admitting: Internal Medicine

## 2020-11-03 DIAGNOSIS — I10 Essential (primary) hypertension: Secondary | ICD-10-CM

## 2020-11-04 ENCOUNTER — Other Ambulatory Visit: Payer: Self-pay

## 2020-11-04 ENCOUNTER — Encounter: Payer: Self-pay | Admitting: Internal Medicine

## 2020-11-04 ENCOUNTER — Other Ambulatory Visit: Payer: Self-pay | Admitting: Internal Medicine

## 2020-11-04 ENCOUNTER — Ambulatory Visit: Payer: BC Managed Care – PPO | Attending: Internal Medicine | Admitting: Internal Medicine

## 2020-11-04 DIAGNOSIS — E209 Hypoparathyroidism, unspecified: Secondary | ICD-10-CM | POA: Diagnosis not present

## 2020-11-04 DIAGNOSIS — E782 Mixed hyperlipidemia: Secondary | ICD-10-CM | POA: Diagnosis not present

## 2020-11-04 DIAGNOSIS — I10 Essential (primary) hypertension: Secondary | ICD-10-CM

## 2020-11-04 MED ORDER — AMLODIPINE BESYLATE 10 MG PO TABS
1.0000 | ORAL_TABLET | Freq: Every day | ORAL | 1 refills | Status: DC
Start: 2020-11-04 — End: 2021-06-05

## 2020-11-04 MED ORDER — CALCITRIOL 0.25 MCG PO CAPS: 0.2500 ug | ORAL_CAPSULE | Freq: Every day | ORAL | 5 refills | Status: DC

## 2020-11-04 MED ORDER — LISINOPRIL 40 MG PO TABS: 40.0000 mg | ORAL_TABLET | Freq: Every day | ORAL | 1 refills | Status: DC

## 2020-11-04 MED ORDER — ATORVASTATIN CALCIUM 10 MG PO TABS: 10.0000 mg | ORAL_TABLET | Freq: Every day | ORAL | 6 refills | Status: DC

## 2020-11-04 NOTE — Telephone Encounter (Signed)
   Notes to clinic Has appt today, please assess.  

## 2020-11-04 NOTE — Progress Notes (Signed)
Virtual Visit via Telephone Note  I connected with Peter Hahn on 11/04/2020 at 12:32 p.m by telephone and verified that I am speaking with the correct person using two identifiers.  Location: Patient: work Provider: office  Participants: Myself Patient CMA: Ms. Peter Hahn   I discussed the limitations, risks, security and privacy concerns of performing an evaluation and management service by telephone and the availability of in person appointments. I also discussed with the patient that there may be a patient responsible charge related to this service. The patient expressed understanding and agreed to proceed.   History of Present Illness: Pt with hx of HTN, HL, morbid obesity, gen anxiety, allergic rhinitis, hypoparathyroidand eczema.   Last evaluated 05/2020.  Today's visit is for chronic disease management.  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  HYPERTENSION Currently taking: see medication list.  On Norvasc and lisinopril. Med Adherence: [x]  Yes    []  No Medication side effects: []  Yes    [x]  No Adherence with salt restriction: [x]  Yes    []  No Home Monitoring?: [x]  Yes    []  No Monitoring Frequency: Several times a week. Home BP results range: Most readings are 130/80 or lower.  Last reading was done a few days ago and was 132/78. SOB? []  Yes    [x]  No Chest Pain?: []  Yes    [x]  No Leg swelling?: []  Yes    [x]  No Headaches?: []  Yes    [x]  No Dizziness? []  Yes    [x]  No Comments:  Labs ordered on last visit.  He never came to have done.  States he will come to lab this Friday for his blood test  HL: Taking and tolerating Lipitor.  Needs refills.  Hypoparathyroidism: Out of Rocaltrol x2 months.  Never did get back in for follow-up with Dr. .  He is agreeable to me submitting a referral.   Outpatient Encounter Medications as of 11/04/2020   Medication Sig  . acetaminophen (TYLENOL) 325 MG tablet Take 2 tablets (650 mg total) by mouth every 6 (six) hours as needed.  albuterol (VENTOLIN HFA) 108 (90 Base) MCG/ACT inhaler Inhale 2 puffs into the lungs every 6 (six) hours as needed for wheezing or shortness of breath.  amLODipine (NORVASC) 10 MG tablet TAKE 1 TABLET BY MOUTH EVERY DAY  . atorvastatin (LIPITOR) 10 MG tablet Take 1 tablet (10 mg total) by mouth daily.  . benzonatate (TESSALON PERLES) 100 MG capsule Take 1 capsule (100 mg total) by mouth 3 (three) times daily as needed.  . calcitRIOL (ROCALTROL) 0.25 MCG capsule Take 1 capsule (0.25 mcg total) by mouth daily.  . Calcium Citrate (CITRACAL PO) Take 400 mg by mouth 4 (four) times daily.  . fluticasone (FLONASE) 50 MCG/ACT nasal spray Place 2 sprays into both nostrils daily.  levocetirizine (XYZAL) 5 MG tablet Take 1 tablet (5 mg total) by mouth every evening. (Patient taking differently: Take 5 mg by mouth daily as needed. )  . lisinopril (ZESTRIL) 40 MG tablet Take 1 tablet (40 mg total) by mouth daily.   No facility-administered encounter medications on file as of 11/04/2020.      Observations/Objective: No direct observation done as this was a telephone encounter.  Assessment and Plan: 1. Essential hypertension Last reading close to goal.  Continue current meds - amLODipine (NORVASC) 10 MG tablet; Take  1 tablet (10 mg total) by mouth daily.  Dispense: 90 tablet; Refill: 1 - lisinopril (ZESTRIL) 40 MG tablet; Take 1 tablet (40 mg total) by mouth daily.  Dispense: 90 tablet; Refill: 1  2. Mixed hyperlipidemia - atorvastatin (LIPITOR) 10 MG tablet; Take 1 tablet (10 mg total) by mouth daily.  Dispense: 30 tablet; Refill: 6  3. Hypoparathyroidism, unspecified hypoparathyroidism type (HCC) - Ambulatory referral to Endocrinology - calcitRIOL (ROCALTROL) 0.25 MCG capsule; Take 1 capsule (0.25 mcg total) by mouth daily.  Dispense: 30 capsule; Refill: 5   Follow  Up Instructions: 4 mths in person   I discussed the assessment and treatment plan with the patient. The patient was provided an opportunity to ask questions and all were answered. The patient agreed with the plan and demonstrated an understanding of the instructions.   The patient was advised to call back or seek an in-person evaluation if the symptoms worsen or if the condition fails to improve as anticipated.  I  Spent 7 minutes on this telephone encounter  Jonah Blue, MD

## 2020-11-05 ENCOUNTER — Other Ambulatory Visit: Payer: Self-pay | Admitting: Family

## 2021-05-02 ENCOUNTER — Other Ambulatory Visit: Payer: Self-pay | Admitting: Internal Medicine

## 2021-05-02 DIAGNOSIS — I10 Essential (primary) hypertension: Secondary | ICD-10-CM

## 2021-05-02 DIAGNOSIS — E209 Hypoparathyroidism, unspecified: Secondary | ICD-10-CM

## 2021-05-02 NOTE — Telephone Encounter (Signed)
Requested medication (s) are due for refill today: yes  Requested medication (s) are on the active medication list: yes  Last refill:  11/04/20 for both  Future visit scheduled: no  Notes to clinic:  needs lab work   Requested Prescriptions  Pending Prescriptions Disp Refills   calcitRIOL (ROCALTROL) 0.25 MCG capsule [Pharmacy Med Name: CALCITRIOL 0.25 MCG CAPSULE] 90 capsule 1    Sig: TAKE 1 CAPSULE BY MOUTH EVERY DAY     Endocrinology:  Vitamins - Vitamin D Supplementation Failed - 05/02/2021 11:07 AM      Failed - 50,000 IU strengths are not delegated      Failed - Ca in normal range and within 360 days    Calcium  Date Value Ref Range Status  07/09/2019 8.4 (L) 8.7 - 10.2 mg/dL Final   Calcium, Ion  Date Value Ref Range Status  10/03/2014 0.79 (L) 1.12 - 1.23 mmol/L Final          Failed - Phosphate in normal range and within 360 days    Phosphorus  Date Value Ref Range Status  09/26/2014 4.4 2.3 - 4.6 mg/dL Final          Failed - Vitamin D in normal range and within 360 days    Vit D, 25-Hydroxy  Date Value Ref Range Status  05/22/2018 35.2 30.0 - 100.0 ng/mL Final    Comment:    Vitamin D deficiency has been defined by the Institute of Medicine and an Endocrine Society practice guideline as a level of serum 25-OH vitamin D less than 20 ng/mL (1,2). The Endocrine Society went on to further define vitamin D insufficiency as a level between 21 and 29 ng/mL (2). 1. IOM (Institute of Medicine). 2010. Dietary reference    intakes for calcium and D. [place]: The    [employer]. 2. Holick MF, Binkley [place], Bischoff-Ferrari HA, et al.    Evaluation, treatment, and prevention of vitamin D    deficiency: an Endocrine Society clinical practice    guideline. JCEM. 2011 Jul; 96(7):1911-30.           Passed - Valid encounter within last 12 months    Recent Outpatient Visits           5 months ago Essential hypertension   [facility] Marcine [facility], [facility]   11 months ago Essential hypertension   [facility] Marcine Matar, MD   1 year ago Essential hypertension   [facility] Marcine Matar, MD   2 years ago Essential hypertension   [facility] Marcine Matar, MD   4 years ago Essential hypertension   [facility] Marcine Matar, MD               lisinopril (ZESTRIL) 40 MG tablet [Pharmacy Med Name: LISINOPRIL 40 MG TABLET] 90 tablet 1    Sig: TAKE 1 TABLET BY MOUTH EVERY DAY     Cardiovascular:  ACE Inhibitors Failed - 05/02/2021 11:07 AM      Failed - Cr in normal range and within 180 days    Creat  Date Value Ref Range Status  09/30/2016 0.99 0.60 - 1.35 mg/dL Final   Creatinine, Ser  Date Value Ref Range Status  07/09/2019 1.07 0.76 - 1.27 mg/dL Final   Creatinine, Urine  Date Value Ref Range Status  08/26/2014 143.5 mg/dL  Final    Comment:    No reference range established.          Failed - K in normal range and within 180 days    Potassium  Date Value Ref Range Status  07/09/2019 4.5 3.5 - 5.2 mmol/L Final          Passed - Patient is not pregnant      Passed - Last BP in normal range    BP Readings from Last 1 Encounters:  08/31/19 132/80          Passed - Valid encounter within last 6 months    Recent Outpatient Visits           5 months ago Essential hypertension   [facility], [facility]   11 months ago Essential hypertension   [facility] Marcine Matar, MD   1 year ago Essential hypertension   [facility] Marcine Matar, MD   2 years ago Essential hypertension   [facility] Marcine Matar, MD   4 years ago Essential hypertension   [facility]  Marcine Matar, MD

## 2021-05-14 ENCOUNTER — Ambulatory Visit: Payer: BC Managed Care – PPO | Attending: Internal Medicine | Admitting: Internal Medicine

## 2021-05-14 ENCOUNTER — Encounter: Payer: Self-pay | Admitting: Internal Medicine

## 2021-05-14 ENCOUNTER — Other Ambulatory Visit: Payer: Self-pay

## 2021-05-14 VITALS — BP 138/86 | HR 115 | Ht 70.0 in | Wt 377.0 lb

## 2021-05-14 DIAGNOSIS — E209 Hypoparathyroidism, unspecified: Secondary | ICD-10-CM

## 2021-05-14 DIAGNOSIS — Z2821 Immunization not carried out because of patient refusal: Secondary | ICD-10-CM

## 2021-05-14 DIAGNOSIS — I10 Essential (primary) hypertension: Secondary | ICD-10-CM

## 2021-05-14 DIAGNOSIS — E782 Mixed hyperlipidemia: Secondary | ICD-10-CM

## 2021-05-14 DIAGNOSIS — Z1159 Encounter for screening for other viral diseases: Secondary | ICD-10-CM

## 2021-05-14 NOTE — Progress Notes (Addendum)
Patient ID: Mirl Hillery, male    DOB: 1980/10/08  MRN: [id number]  CC: chronic ds management  Subjective: Peter Hahn is a 40 y.o. male who presents for chronic ds management His concerns today include:  Pt with hx of HTN, HL, morbid obesity, gen anxiety, allergic rhinitis, hypoparathyroid and eczema.    Obesity: wgh up 27 lbs since beginning last yr. He is mad and disappointed about his wgh. Has a dog and walks with him sometimes.  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 Stopped drinking regular sodas  HYPERTENSION Currently taking: see medication list that include lisinopril and amlodipine Med Adherence: [x]  Yes    []  No Medication side effects: []  Yes    [x]  No Adherence with salt restriction: [x]  Yes    []  No Home Monitoring?: [x]  Yes    []  No Monitoring Frequency: 2x/wk Home BP results range: 130/78 SOB? []  Yes    [x]  No Chest Pain?: []  Yes    [x]  No Leg swelling?: []  Yes    [x]  No Headaches?: []  Yes    [x]  No Dizziness? []  Yes    [x]  No Comments:   HL:  taking and tolerating Lipitor  Hypoparathyroid: referred to Dr. on last visit.  He was called but pt states he has been busy and missed called.  He will try to reschedule when things slow down for him.  flu shot and tdapt.  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 60 tablet 0   amLODipine (NORVASC) 10 MG tablet Take 1 tablet (10 mg total) by mouth daily.  90 tablet 1   atorvastatin (LIPITOR) 10 MG tablet Take 1 tablet (10 mg total) by mouth daily. 30 tablet 6   calcitRIOL (ROCALTROL) 0.25 MCG capsule Take 1 capsule (0.25 mcg total) by mouth daily. 30 capsule 5   Calcium Citrate (CITRACAL PO) Take 400 mg by mouth 4 (four) times daily.     fluticasone (FLONASE) 50 MCG/ACT nasal spray Place 2 sprays into both nostrils daily. 16 g 6   lisinopril (ZESTRIL) 40 MG tablet Take 1 tablet (40 mg total) by mouth daily. 90 tablet 1   albuterol (VENTOLIN HFA) 108 (90 Base) MCG/ACT inhaler Inhale 2 puffs into the lungs every 6 (six) hours as needed for wheezing or shortness of breath. (Patient not taking: Reported on 05/14/2021) 8 g 0   No current facility-administered medications on file prior to visit.    Allergies  Allergen Reactions   Diltiazem     Body aches, chills   Pollen Extract     Sneezing, headaches    Social History   Socioeconomic History   Marital status: Married    Spouse name: Not on file   Number of children: 0   Years of education: Not on file   Highest education level: Not on file  Occupational History   Not on file  Tobacco  Use   Smoking status: Never   Smokeless tobacco: Never  Substance and Sexual Activity   Alcohol use: No    Alcohol/week: 0.0 standard drinks   Drug use: No    Frequency: 1.0 times per week   Sexual activity: Yes    Partners: Female    Birth control/protection: Condom  Other Topics Concern   Not on file  Social History Narrative   Not on file   Social Determinants of Health   Financial Resource Strain: Not on file  Food Insecurity: Not on file  Transportation Needs: Not on file  Physical Activity: Not on file  Stress: Not on file  Social Connections: Not on file  Intimate Partner Violence: Not on file    Family History  Problem Relation Age of Onset   Diabetes Mother    Diabetes Father    Hypertension Father    Hypertension Paternal Grandmother     No past surgical history on  file.  ROS: Review of Systems Negative except as stated above  PHYSICAL EXAM: BP 138/86   Pulse (!) 115   Ht 5\' 10"  (1.778 m)   Wt (!) 377 lb (171 kg)   SpO2 97%   BMI 54.09 kg/m   Wt Readings from Last 3 Encounters:  05/14/21 (!) 377 lb (171 kg)  08/31/19 (!) 350 lb 12.8 oz (159.1 kg)  05/22/18 (!) 347 lb 3.2 oz (157.5 kg)    Physical Exam General appearance - alert, well appearing, obese middle-age African-American male and in no distress Mental status - alert, oriented to person, place, and time Neck - supple, no significant adenopathy Chest - clear to auscultation, no wheezes, rales or rhonchi, symmetric air entry Heart - normal rate, regular rhythm, normal S1, S2, no murmurs, rubs, clicks or gallops Extremities - peripheral pulses normal, no pedal edema, no clubbing or cyanosis  CMP Latest Ref Rng & Units 07/09/2019 05/22/2018 10/15/2016  Glucose 65 - 99 mg/dL 97 87 10/17/2016)  BUN 6 - 24 mg/dL 11 13 9   Creatinine 0.76 - 1.27 mg/dL 517(O 1.60  Sodium 134 - 144 mmol/L 140 138 138  Potassium 3.5 - 5.2 mmol/L 4.5 4.4 3.5  Chloride 96 - 106 mmol/L 100 98 100(L)  CO2 20 - 29 mmol/L 26 22 26   Calcium 8.7 - 10.2 mg/dL 7.37) 7.8(L) 7.5(L)  Total Protein 6.0 - 8.5 g/dL 7.0 7.5 -  Total Bilirubin 0.0 - 1.2 mg/dL 0.3 0.2 -  Alkaline Phos 39 - 117 IU/L 86 89 -  AST 0 - 40 IU/L 17 20 -  ALT 0 - 44 IU/L 16 21 -   Lipid Panel     Component Value Date/Time   CHOL 132 07/09/2019 0846   TRIG 119 07/09/2019 0846   HDL 32 (L) 07/09/2019 0846   CHOLHDL 4.1 07/09/2019 0846   CHOLHDL 5.9 08/26/2014 1459   VLDL 26 08/26/2014 1459   LDLCALC 78 07/09/2019 0846    CBC    Component Value Date/Time   WBC 9.6 07/09/2019 0846   WBC 13.1 (H) 10/15/2016 2016   RBC 4.49 07/09/2019 0846   RBC 4.31 10/15/2016 2016   HGB 13.8 07/09/2019 0846   HCT 41.5 07/09/2019 0846   PLT 281 07/09/2019 0846   MCV 92 07/09/2019 0846   MCH 30.7 07/09/2019 0846   MCH 29.9 10/15/2016 2016   MCHC  33.3 07/09/2019 0846   MCHC 32.7 10/15/2016 2016   RDW 13.4 07/09/2019 0846   LYMPHSABS 1,887 09/30/2016 1411  MONOABS 444 09/30/2016 1411   EOSABS 111 09/30/2016 1411   BASOSABS 0 09/30/2016 1411    ASSESSMENT AND PLAN: 1. Essential hypertension Not at goal but patient reports blood pressure at home is good.  He will continue current medications and low-salt diet. - CBC - Comprehensive metabolic panel  2. Mixed hyperlipidemia Continue Lipitor.  Check lipid profile today. - Lipid panel  3. Hypoparathyroidism, unspecified hypoparathyroidism type (HCC) Encouraged him to schedule with the endocrinologist - Intact PTH (Includes Calcium)  4. Morbid obesity due to excess calories (HCC) Spent some time doing dietary counseling with him.  Advised to cut back on portion sizes overall but especially the white carbohydrates, eliminate all sugary drinks from the diet and drink more water, eat more lean white meat instead of red meat and incorporate fresh fruits and vegetables into the diet.  Encouraged him to incorporate exercise into his workday like taking the stairs and packing away from buildings.  Goal is to try to get in about 150 minutes/week total of some form of moderate intensity exercise. - Amb ref to Medical Nutrition Therapy-MNT  5. Influenza vaccination declined Patient wants to postpone this for now.  He thinks he may get it at work.  6. Need for hepatitis C screening test Patient agreeable to screening. - Hepatitis C Antibody  Addendum: A1c added due to elevated blood sugar on lab results. Patient was given the opportunity to ask questions.  Patient verbalized understanding of the plan and was able to repeat key elements of the plan.   Orders Placed This Encounter  Procedures   CBC   Comprehensive metabolic panel   Lipid panel   Intact PTH (Includes Calcium)   Hepatitis C Antibody   Amb ref to Medical Nutrition Therapy-MNT      Requested Prescriptions    No  prescriptions requested or ordered in this encounter    Return in about 4 months (around 09/14/2021).  Jonah Blue, MD, FACP

## 2021-05-14 NOTE — Patient Instructions (Addendum)
Please call Dr. Cordelia Pen office and schedule your appointment with him.  Healthy Eating Following a healthy eating pattern may help you to achieve and maintain a healthy body weight, reduce the risk of chronic disease, and live a long and productive life. It is important to follow a healthy eating pattern at an appropriate calorie level for your body. Your nutritional needs should be met primarily through food by choosing a variety of nutrient-rich foods. What are tips for following this plan? Reading food labels Read labels and choose the following: Reduced or low sodium. Juices with 100% fruit juice. Foods with low saturated fats and high polyunsaturated and monounsaturated fats. Foods with whole grains, such as whole wheat, cracked wheat, brown rice, and wild rice. Whole grains that are fortified with folic acid. This is recommended for women who are pregnant or who want to become pregnant. Read labels and avoid the following: Foods with a lot of added sugars. These include foods that contain brown sugar, corn sweetener, corn syrup, dextrose, fructose, glucose, high-fructose corn syrup, honey, invert sugar, lactose, malt syrup, maltose, molasses, raw sugar, sucrose, trehalose, or turbinado sugar. Do not eat more than the following amounts of added sugar per day: 6 teaspoons (25 g) for women. 9 teaspoons (38 g) for men. Foods that contain processed or refined starches and grains. Refined grain products, such as white flour, degermed cornmeal, white bread, and white rice. Shopping Choose nutrient-rich snacks, such as vegetables, whole fruits, and nuts. Avoid high-calorie and high-sugar snacks, such as potato chips, fruit snacks, and candy. Use oil-based dressings and spreads on foods instead of solid fats such as butter, stick margarine, or cream cheese. Limit pre-made sauces, mixes, and "instant" products such as flavored rice, instant noodles, and ready-made pasta. Try more plant-protein  sources, such as tofu, tempeh, black beans, edamame, lentils, nuts, and seeds. Explore eating plans such as the Mediterranean diet or vegetarian diet. Cooking Use oil to saut or stir-fry foods instead of solid fats such as butter, stick margarine, or lard. Try baking, boiling, grilling, or broiling instead of frying. Remove the fatty part of meats before cooking. Steam vegetables in water or broth. Meal planning  At meals, imagine dividing your plate into fourths: One-half of your plate is fruits and vegetables. One-fourth of your plate is whole grains. One-fourth of your plate is protein, especially lean meats, poultry, eggs, tofu, beans, or nuts. Include low-fat dairy as part of your daily diet. Lifestyle Choose healthy options in all settings, including home, work, school, restaurants, or stores. Prepare your food safely: Wash your hands after handling raw meats. Keep food preparation surfaces clean by regularly washing with hot, soapy water. Keep raw meats separate from ready-to-eat foods, such as fruits and vegetables. Cook seafood, meat, poultry, and eggs to the recommended internal temperature. Store foods at safe temperatures. In general: Keep cold foods at 64F (4.4C) or below. Keep hot foods at 164F (60C) or above. Keep your freezer at [facility] (-17.8C) or below. Foods are no longer safe to eat when they have been between the temperatures of 40-164F (4.4-60C) for more than 2 hours. What foods should I eat? Fruits Aim to eat 2 cup-equivalents of fresh, canned (in natural juice), or frozen fruits each day. Examples of 1 cup-equivalent of fruit include 1 small apple, 8 large strawberries, 1 cup canned fruit,  cup dried fruit, or 1 cup 100% juice. Vegetables Aim to eat 2-3 cup-equivalents of fresh and frozen vegetables each day, including different varieties and colors. Examples  of 1 cup-equivalent of vegetables include 2 medium carrots, 2 cups raw, leafy greens, 1 cup  chopped vegetable (raw or cooked), or 1 medium baked potato. Grains Aim to eat 6 ounce-equivalents of whole grains each day. Examples of 1 ounce-equivalent of grains include 1 slice of bread, 1 cup ready-to-eat cereal, 3 cups popcorn, or  cup cooked rice, pasta, or cereal. Meats and other proteins Aim to eat 5-6 ounce-equivalents of protein each day. Examples of 1 ounce-equivalent of protein include 1 egg, 1/2 cup nuts or seeds, or 1 tablespoon (16 g) peanut butter. A cut of meat or fish that is the size of a deck of cards is about 3-4 ounce-equivalents. Of the protein you eat each week, try to have at least 8 ounces come from seafood. This includes salmon, trout, herring, and anchovies. Dairy Aim to eat 3 cup-equivalents of fat-free or low-fat dairy each day. Examples of 1 cup-equivalent of dairy include 1 cup (240 mL) milk, 8 ounces (250 g) yogurt, 1 ounces (44 g) natural cheese, or 1 cup (240 mL) fortified soy milk. Fats and oils Aim for about 5 teaspoons (21 g) per day. Choose monounsaturated fats, such as canola and olive oils, avocados, peanut butter, and most nuts, or polyunsaturated fats, such as sunflower, corn, and soybean oils, walnuts, pine nuts, sesame seeds, sunflower seeds, and flaxseed. Beverages Aim for six 8-oz glasses of water per day. Limit coffee to three to five 8-oz cups per day. Limit caffeinated beverages that have added calories, such as soda and energy drinks. Limit alcohol intake to no more than 1 drink a day for nonpregnant women and 2 drinks a day for men. One drink equals 12 oz of beer (355 mL), 5 oz of wine (148 mL), or 1 oz of hard liquor (44 mL). Seasoning and other foods Avoid adding excess amounts of salt to your foods. Try flavoring foods with herbs and spices instead of salt. Avoid adding sugar to foods. Try using oil-based dressings, sauces, and spreads instead of solid fats. This information is based on general [place] nutrition guidelines. For more  information, visit [employer]. Exact amounts may vary based on your nutrition needs. Summary A healthy eating plan may help you to maintain a healthy weight, reduce the risk of chronic diseases, and stay active throughout your life. Plan your meals. Make sure you eat the right portions of a variety of nutrient-rich foods. Try baking, boiling, grilling, or broiling instead of frying. Choose healthy options in all settings, including home, work, school, restaurants, or stores. This information is not intended to replace advice given to you by your health care provider. Make sure you discuss any questions you have with your health care provider. Document Revised: 10/31/2017 Document Reviewed: 10/31/2017 Elsevier Patient Education  [place].

## 2021-05-15 LAB — CBC
Hematocrit: 40.9 % (ref 37.5–51.0)
Hemoglobin: 13.8 g/dL (ref 13.0–17.7)
MCH: 30.7 pg (ref 26.6–33.0)
MCHC: 33.7 g/dL (ref 31.5–35.7)
MCV: 91 fL (ref 79–97)
Platelets: 309 x10E3/uL (ref 150–450)
RBC: 4.5 x10E6/uL (ref 4.14–5.80)
RDW: 13.8 % (ref 11.6–15.4)
WBC: 9.6 x10E3/uL (ref 3.4–10.8)

## 2021-05-15 LAB — COMPREHENSIVE METABOLIC PANEL
ALT: 20 IU/L (ref 0–44)
AST: 15 IU/L (ref 0–40)
Albumin/Globulin Ratio: 1.5 (ref 1.2–2.2)
Albumin: 4.3 g/dL (ref 4.0–5.0)
Alkaline Phosphatase: 91 IU/L (ref 44–121)
BUN/Creatinine Ratio: 13 (ref 9–20)
BUN: 14 mg/dL (ref 6–24)
Bilirubin Total: 0.2 mg/dL (ref 0.0–1.2)
CO2: 24 mmol/L (ref 20–29)
Calcium: 8.8 mg/dL (ref 8.7–10.2)
Chloride: 102 mmol/L (ref 96–106)
Creatinine, Ser: 1.05 mg/dL (ref 0.76–1.27)
Globulin, Total: 2.8 g/dL (ref 1.5–4.5)
Glucose: 129 mg/dL — ABNORMAL HIGH (ref 70–99)
Potassium: 4.1 mmol/L (ref 3.5–5.2)
Sodium: 142 mmol/L (ref 134–144)
Total Protein: 7.1 g/dL (ref 6.0–8.5)
eGFR: 91 mL/min/{1.73_m2} (ref >59)

## 2021-05-15 LAB — LIPID PANEL
Chol/HDL Ratio: 4.1 ratio (ref 0.0–5.0)
Cholesterol, Total: 157 mg/dL (ref 100–199)
HDL: 38 mg/dL — ABNORMAL LOW (ref >39)
LDL Chol Calc (NIH): 99 mg/dL (ref 0–99)
Triglycerides: 107 mg/dL (ref 0–149)
VLDL Cholesterol Cal: 20 mg/dL (ref 5–40)

## 2021-05-15 LAB — HEPATITIS C ANTIBODY: Hep C Virus Ab: <0.1 s/co ratio (ref 0.0–0.9)

## 2021-05-15 NOTE — Addendum Note (Signed)
Addended by: Jonah Blue B on: 05/15/2021 07:55 AM   Modules accepted: Orders

## 2021-05-16 ENCOUNTER — Other Ambulatory Visit: Payer: Self-pay | Admitting: Internal Medicine

## 2021-05-16 DIAGNOSIS — E782 Mixed hyperlipidemia: Secondary | ICD-10-CM

## 2021-05-16 NOTE — Telephone Encounter (Signed)
Requested Prescriptions  Pending Prescriptions Disp Refills  . atorvastatin (LIPITOR) 10 MG tablet [Pharmacy Med Name: ATORVASTATIN 10 MG TABLET] 90 tablet 2    Sig: TAKE 1 TABLET BY MOUTH EVERY DAY     Cardiovascular:  Antilipid - Statins Failed - 05/16/2021 10:02 AM      Failed - HDL in normal range and within 360 days    HDL  Date Value Ref Range Status  05/14/2021 38 (L) >39 mg/dL Final         Passed - Total Cholesterol in normal range and within 360 days    Cholesterol, Total  Date Value Ref Range Status  05/14/2021 157 100 - 199 mg/dL Final         Passed - LDL in normal range and within 360 days    LDL Chol Calc (NIH)  Date Value Ref Range Status  05/14/2021 99 0 - 99 mg/dL Final         Passed - Triglycerides in normal range and within 360 days    Triglycerides  Date Value Ref Range Status  05/14/2021 107 0 - 149 mg/dL Final         Passed - Patient is not pregnant      Passed - Valid encounter within last 12 months    Recent Outpatient Visits          2 days ago Essential hypertension   [facility] Marcine Matar, MD   6 months ago Essential hypertension   [facility] Marcine Matar, MD   1 year ago Essential hypertension   [facility] Marcine Matar, MD   2 years ago Essential hypertension    [facility]   2 years ago Essential hypertension   [facility] Marcine Matar, MD

## 2021-05-18 ENCOUNTER — Telehealth: Payer: Self-pay | Admitting: Internal Medicine

## 2021-05-18 DIAGNOSIS — E1169 Type 2 diabetes mellitus with other specified complication: Secondary | ICD-10-CM

## 2021-05-18 MED ORDER — LANCETS MISC: 3 refills | Status: AC

## 2021-05-18 MED ORDER — METFORMIN HCL 500 MG PO TABS: 500.0000 mg | ORAL_TABLET | Freq: Every day | ORAL | 1 refills | Status: DC

## 2021-05-18 MED ORDER — GLUCOSE BLOOD VI STRP: ORAL_STRIP | 12 refills | Status: AC

## 2021-05-18 MED ORDER — CONTOUR NEXT MONITOR W/DEVICE KIT: PACK | 0 refills | Status: AC

## 2021-05-18 NOTE — Telephone Encounter (Signed)
Phone call placed to patient this morning to let him know that his A1c is now in the range for diabetes.  I discussed with him the importance of healthy eating habits, regular exercise to help control diabetes and in doing so prevent some of its complications.  I went over some of the complications that can develop from uncontrolled diabetes.  Recommend starting him on low-dose of metformin.  Advised that metformin can sometimes cause bloating and diarrhea.  If he has any problems with the medication he should let me know.  I also told him that I can send prescription to his pharmacy for diabetic testing supplies if he wishes to check blood sugars.  He did want testing supplies.  Advised to check blood sugars at least once a day in the mornings before breakfast and the goal is 90-130 before meals.  I told him that I will send him additional information via MyChart about diabetes.  All questions were answered.

## 2021-05-19 ENCOUNTER — Other Ambulatory Visit: Payer: Self-pay | Admitting: Internal Medicine

## 2021-05-19 DIAGNOSIS — E209 Hypoparathyroidism, unspecified: Secondary | ICD-10-CM

## 2021-05-19 DIAGNOSIS — I10 Essential (primary) hypertension: Secondary | ICD-10-CM

## 2021-05-19 LAB — INTACT PTH (INCLUDES CALCIUM)
Calcium, Serum: 9 mg/dL
PTH (Intact Assay): 139 pg/mL — ABNORMAL HIGH

## 2021-05-20 NOTE — Telephone Encounter (Signed)
Requested medications are due for refill today.  yes  Requested medications are on the active medications list.  yes  Last refill. 11/04/2020  Future visit scheduled.   no  Notes to clinic.  Medication not delegated.

## 2021-05-20 NOTE — Telephone Encounter (Signed)
Requested Prescriptions  Pending Prescriptions Disp Refills  . lisinopril (ZESTRIL) 40 MG tablet [Pharmacy Med Name: LISINOPRIL 40 MG TABLET] 90 tablet 1    Sig: TAKE 1 TABLET BY MOUTH EVERY DAY     Cardiovascular:  ACE Inhibitors Passed - 05/19/2021  6:59 PM      Passed - Cr in normal range and within 180 days    Creat  Date Value Ref Range Status  09/30/2016 0.99 0.60 - 1.35 mg/dL Final   Creatinine, Ser  Date Value Ref Range Status  05/14/2021 1.05 0.76 - 1.27 mg/dL Final   Creatinine, Urine  Date Value Ref Range Status  08/26/2014 143.5 mg/dL Final    Comment:    No reference range established.         Passed - K in normal range and within 180 days    Potassium  Date Value Ref Range Status  05/14/2021 4.1 3.5 - 5.2 mmol/L Final         Passed - Patient is not pregnant      Passed - Last BP in normal range    BP Readings from Last 1 Encounters:  05/14/21 138/86         Passed - Valid encounter within last 6 months    Recent Outpatient Visits          6 days ago Essential hypertension   [facility] Marcine Matar, [facility]   6 months ago Essential hypertension   [facility] Marcine Matar, MD   1 year ago Essential hypertension   [place] [facility] Marcine Matar, MD   2 years ago Essential hypertension   [facility] [facility] Marcine Matar, MD   2 years ago Essential hypertension    [facility]             . calcitRIOL (ROCALTROL) 0.25 MCG capsule [Pharmacy Med Name: CALCITRIOL 0.25 MCG CAPSULE] 90 capsule 1    Sig: TAKE 1 CAPSULE BY MOUTH EVERY DAY     Endocrinology:  Vitamins - Vitamin D Supplementation Failed - 05/19/2021  6:59 PM      Failed - 50,000 IU strengths are not delegated      Failed - Phosphate in normal range and within 360 days    Phosphorus  Date Value Ref Range Status   09/26/2014 4.4 2.3 - 4.6 mg/dL Final         Failed - Vitamin D in normal range and within 360 days    Vit D, 25-Hydroxy  Date Value Ref Range Status  05/22/2018 35.2 30.0 - 100.0 ng/mL Final    Comment:    Vitamin D deficiency has been defined by the Institute of Medicine and an Endocrine Society practice guideline as a level of serum 25-OH vitamin D less than 20 ng/mL (1,2). The Endocrine Society went on to further define vitamin D insufficiency as a level between 21 and 29 ng/mL (2). 1. IOM (Institute of Medicine). 2010. Dietary reference    intakes for calcium and D. [place]: The    [employer]. 2. Holick MF, Binkley [place], Bischoff-Ferrari HA, et al.    Evaluation, treatment, and prevention of vitamin D    deficiency: an Endocrine Society clinical practice    guideline. JCEM. 2011 Jul; 96(7):1911-30.          Passed - Ca in normal range and  within 360 days    Calcium  Date Value Ref Range Status  05/14/2021 8.8 8.7 - 10.2 mg/dL Final   Calcium, Ion  Date Value Ref Range Status  10/03/2014 0.79 (L) 1.12 - 1.23 mmol/L Final         Passed - Valid encounter within last 12 months    Recent Outpatient Visits          6 days ago Essential hypertension   [facility] Marcine Matar, MD   6 months ago Essential hypertension   [facility] [facility] Marcine Matar, MD   1 year ago Essential hypertension   [place] [facility] Marcine Matar, MD   2 years ago Essential hypertension    [facility]   2 years ago Essential hypertension   [facility] Marcine Matar, MD

## 2021-06-04 ENCOUNTER — Encounter: Payer: Self-pay | Admitting: Internal Medicine

## 2021-06-05 ENCOUNTER — Other Ambulatory Visit: Payer: Self-pay | Admitting: Pharmacist

## 2021-06-05 DIAGNOSIS — I10 Essential (primary) hypertension: Secondary | ICD-10-CM

## 2021-06-05 MED ORDER — AMLODIPINE BESYLATE 10 MG PO TABS: 10.0000 mg | ORAL_TABLET | Freq: Every day | ORAL | 1 refills | Status: DC

## 2021-06-08 ENCOUNTER — Ambulatory Visit: Payer: BC Managed Care – PPO | Admitting: Endocrinology

## 2021-06-08 LAB — SPECIMEN STATUS REPORT

## 2021-06-08 LAB — HGB A1C W/O EAG: Hgb A1c MFr Bld: 7.5 % — ABNORMAL HIGH (ref 4.8–5.6)

## 2021-07-03 ENCOUNTER — Ambulatory Visit: Payer: BC Managed Care – PPO | Admitting: Endocrinology

## 2021-07-15 ENCOUNTER — Encounter: Payer: Self-pay | Admitting: Dietician

## 2021-07-15 ENCOUNTER — Encounter: Payer: BC Managed Care – PPO | Attending: Internal Medicine | Admitting: Dietician

## 2021-07-15 ENCOUNTER — Ambulatory Visit: Payer: BC Managed Care – PPO | Admitting: Endocrinology

## 2021-07-15 ENCOUNTER — Other Ambulatory Visit: Payer: Self-pay

## 2021-07-15 VITALS — BP 146/82 | HR 113 | Ht 70.0 in | Wt 365.6 lb

## 2021-07-15 DIAGNOSIS — E201 Pseudohypoparathyroidism: Secondary | ICD-10-CM

## 2021-07-15 LAB — TSH: TSH: 3.87 u[IU]/mL (ref 0.35–5.50)

## 2021-07-15 LAB — BASIC METABOLIC PANEL
BUN: 17 mg/dL (ref 6–23)
CO2: 30 mEq/L (ref 19–32)
Calcium: 9 mg/dL (ref 8.4–10.5)
Chloride: 101 mEq/L (ref 96–112)
Creatinine, Ser: 1.11 mg/dL (ref 0.40–1.50)
GFR: 81.94 mL/min (ref >60.00)
Glucose, Bld: 112 mg/dL — ABNORMAL HIGH (ref 70–99)
Potassium: 4.2 mEq/L (ref 3.5–5.1)
Sodium: 141 mEq/L (ref 135–145)

## 2021-07-15 LAB — MAGNESIUM: Magnesium: 2 mg/dL (ref 1.5–2.5)

## 2021-07-15 LAB — PHOSPHORUS: Phosphorus: 4.2 mg/dL (ref 2.3–4.6)

## 2021-07-15 LAB — T4, FREE: Free T4: 0.82 ng/dL (ref 0.60–1.60)

## 2021-07-15 LAB — VITAMIN D 25 HYDROXY (VIT D DEFICIENCY, FRACTURES): VITD: 46.86 ng/mL (ref 30.00–100.00)

## 2021-07-15 NOTE — Progress Notes (Signed)
Subjective:    Patient ID: Peter Hahn, male    DOB: 01/11/1981, 40 y.o.   MRN: [id number]  HPI Pt returns for f/u of pseudohypoparathyroidism (dx'ed 1991; he took rocaltrol for approx 9 years, then changed to vit D3/Ca++ combined pill, but he does not know the dosage; he was then changed to rocaltrol).  After being out of calcitriol x 1 month, he is back on x 2 months.  While off it, he had fatigue.  Back on it, sxs are resolved.   Past Medical History:  Diagnosis Date   Allergy    since birth    Hypertension 2006   first told his BP was high, never treated    Obesity    Pseudohypoparathyroidism     No past surgical history on file.  Social History   Socioeconomic History   Marital status: Married    Spouse name: Not on file   Number of children: 0   Years of education: Not on file   Highest education level: Not on file  Occupational History   Not on file  Tobacco Use   Smoking status: Never   Smokeless tobacco: Never  Substance and Sexual Activity   Alcohol use: No    Alcohol/week: 0.0 standard drinks   Drug use: No    Frequency: 1.0 times per week   Sexual activity: Yes    Partners: Female    Birth control/protection: Condom  Other Topics Concern   Not on file  Social History Narrative   Not on file   Social Determinants of Health   Financial Resource Strain: Not on file  Food Insecurity: Not on file  Transportation Needs: Not on file  Physical Activity: Not on file  Stress: Not on file  Social Connections: Not on file  Intimate Partner Violence: Not on file    Current Outpatient Medications on File Prior to Visit  Medication Sig Dispense Refill   amLODipine (NORVASC) 10 MG tablet Take 1 tablet (10 mg total) by mouth daily. 90 tablet 1   atorvastatin (LIPITOR) 10 MG tablet TAKE 1 TABLET BY MOUTH EVERY DAY 90 tablet 2   Blood Glucose Monitoring Suppl (CONTOUR NEXT MONITOR) w/Device KIT UAD 1 kit 0   calcitRIOL (ROCALTROL) 0.25 MCG capsule TAKE 1 CAPSULE  BY MOUTH EVERY DAY 90 capsule 1   Calcium Citrate (CITRACAL PO) Take 400 mg by mouth 4 (four) times daily.     fluticasone (FLONASE) 50 MCG/ACT nasal spray Place 2 sprays into both nostrils daily. 16 g 6   glucose blood test strip Use as instructed 100 each 12   Lancets MISC UAD 100 each 3   lisinopril (ZESTRIL) 40 MG tablet TAKE 1 TABLET BY MOUTH EVERY DAY 90 tablet 1   metFORMIN (GLUCOPHAGE) 500 MG tablet Take 1 tablet (500 mg total) by mouth daily with breakfast. 90 tablet 1   acetaminophen (TYLENOL) 325 MG tablet Take 2 tablets (650 mg total) by mouth every 6 (six) hours as needed. 60 tablet 0   albuterol (VENTOLIN HFA) 108 (90 Base) MCG/ACT inhaler Inhale 2 puffs into the lungs every 6 (six) hours as needed for wheezing or shortness of breath. (Patient not taking: Reported on 05/14/2021) 8 g 0   No current facility-administered medications on file prior to visit.    Allergies  Allergen Reactions   Diltiazem     Body aches, chills   Pollen Extract     Sneezing, headaches    Family History  Problem Relation Age  of Onset   Diabetes Mother    Diabetes Father    Hypertension Father    Hypertension Paternal Grandmother     BP (!) 146/82    Pulse (!) 113    Ht 5' 10"  (1.778 m)    Wt (!) 365 lb 9.6 oz (165.8 kg)    SpO2 97%    BMI 52.46 kg/m    Review of Systems     Objective:   Physical Exam    Lab Results  Component Value Date   PTH 88 (H) 07/15/2021   CALCIUM 9.0 07/15/2021   CALCIUM 9.2 07/15/2021   CAION 0.79 (L) 10/03/2014   PHOS 4.2 07/15/2021       Assessment & Plan:  Pseudohypoparathyroidism: well-controlled. Please continue the same rocaltrol. Tachycardia: check TFT

## 2021-07-15 NOTE — Patient Instructions (Addendum)
Blood tests are requested for you today.  We'll let you know about the results.  It is best to never miss the calcitriol.  However, if you do miss it, next best is to double up the next time. Please come back for a follow-up appointment in 3 months.

## 2021-07-15 NOTE — Progress Notes (Signed)
Medical Nutrition Therapy  Appointment Start time:  [id number]  Appointment End time:  0950  Primary concerns today: Elevated A1c  Referral diagnosis: E66.01 - Morbid Obesity Preferred learning style: No preference indicated Learning readiness: Ready   NUTRITION ASSESSMENT   Anthropometrics  Ht: 5'10"" Wt: 365.6 lbs Body mass index is 52.46 kg/m.   Clinical Medical Hx: HTN, Hypoparathyroidism, Elevated A1c Medications: Metformin, Amlodipine, Lisinopril Labs: A1c - 7.5, HDL - 38 Notable Signs/Symptoms: N/A  Lifestyle & Dietary Hx Pt reports fear over elevated A1c. Pt reports giving up Kathlene November and Ikes candies, and began walking again. Pt reports difficulty eating vegetables, states it makes them nauseas.  Pt states that during the pandemic they would cook at home, but now they do not.  Pt is a Editor, commissioning at [employer], states they are too tired to prepare dinner most nights since returning to in person work. Pt reports times of high stress due to family conflicts and work environment. Pt reports history of stress eating that has led to weight gain. Pt will remove themselves from stressful situations, listen to music or spend time with their dog to de-stress.  Estimated daily fluid intake: 64 oz Supplements: Calcium Sleep: No concerns Stress / self-care: Difficulties with teaching students, family conflicts Current average weekly physical activity: ADLs  24-Hr Dietary Recall First Meal:  Snack:  Second Meal:  Snack:  Third Meal:  Snack:  Beverages:     NUTRITION DIAGNOSIS  Anderson-2.2 Altered nutrition-related laboratory As related to elevated A1c.  As evidenced by A1c value of 7.5%, self reported high stress level, and stress eating of high carbohydrate foods..   NUTRITION INTERVENTION  Nutrition education (E-1) on the following topics:  Educated patient on the balanced plate eating model. Recommended lunch and dinner be 1/2 non-starchy vegetables, 1/4 starches, and 1/4 protein.  Counseled patient on allowing themselves to be present in their emotions when they consider emotional eating. Advised patient to evaluate whether the impulse to eat is hunger based, or emotionally driven. Educated patient on the pathophysiology of diabetes. This includes why our bodies need circulating blood sugar, the relationship between insulin and blood sugar, and the results of insulin resistance and/or pancreatic insufficiency on the development of diabetes. Educated patient on factors that contribute to elevation of blood sugars, such as stress, illness, injury,and food choices. Discussed the role that physical activity plays in lowering blood sugar. Educate patient on the three main macronutrients. Protein, fats, and carbohydrates. Discussed how each of these macronutrients affect blood sugar levels, especially carbohydrate, and the importance of eating a consistent amount of carbohydrate throughout the day.   Handouts Provided Include  Balanced Plate Balanced Plate Food List  Learning Style & Readiness for Change Teaching method utilized: Visual & Auditory  Demonstrated degree of understanding via: Teach Back  Barriers to learning/adherence to lifestyle change: High stress level  Goals Established by Pt When you find yourself to be highly stressed and about to stress eat, take a 10 seconds pause and take 10 breaths before stress eating. This will help to allow to you to take a moment to think about whether you are hungry and what has gotten you to this point of stress eating. Work towards eating three meals a day, about 5-6 hours apart! Begin to recognize carbohydrates, proteins, and non-starchy vegetables in your food choices! Begin to build your meals using the proportions of the Balanced Plate. First, select your carb choice(s) for the meal.  Next, select your source of protein  to pair with your carb choice(s). Finally, complete the remaining half of your meal with a variety of  non-starchy vegetables. Try to move your body as much as possible and be active.   MONITORING & EVALUATION Dietary intake, weekly physical activity, and stress levels in 2 months.  Next Steps  Patient is to follow up with RD.

## 2021-07-15 NOTE — Patient Instructions (Addendum)
When you find yourself to be highly stressed and about to stress eat, take a 10 seconds pause and take 10 breaths before stress eating. This will help to allow to you to take a moment to think about whether you are hungry and what has gotten you to this point of stress eating.  Work towards eating three meals a day, about 5-6 hours apart!  Begin to recognize carbohydrates, proteins, and non-starchy vegetables in your food choices!  Begin to build your meals using the proportions of the Balanced Plate. First, select your carb choice(s) for the meal.  Next, select your source of protein to pair with your carb choice(s). Finally, complete the remaining half of your meal with a variety of non-starchy vegetables.  Try to move your body as much as possible and be active.

## 2021-07-16 LAB — PTH, INTACT AND CALCIUM
Calcium: 9.2 mg/dL (ref 8.6–10.3)
PTH: 88 pg/mL — ABNORMAL HIGH (ref 16–77)

## 2021-08-26 ENCOUNTER — Encounter: Payer: BC Managed Care – PPO | Attending: Internal Medicine | Admitting: Dietician

## 2021-09-17 ENCOUNTER — Encounter: Payer: Self-pay | Admitting: Internal Medicine

## 2021-09-17 ENCOUNTER — Encounter: Payer: Self-pay | Admitting: Endocrinology

## 2021-10-14 ENCOUNTER — Ambulatory Visit: Payer: BC Managed Care – PPO | Admitting: Endocrinology

## 2021-11-04 ENCOUNTER — Encounter: Payer: Self-pay | Admitting: Internal Medicine

## 2021-11-04 ENCOUNTER — Other Ambulatory Visit: Payer: Self-pay | Admitting: Internal Medicine

## 2021-11-04 ENCOUNTER — Other Ambulatory Visit: Payer: Self-pay

## 2021-11-04 DIAGNOSIS — E209 Hypoparathyroidism, unspecified: Secondary | ICD-10-CM

## 2021-11-04 DIAGNOSIS — I10 Essential (primary) hypertension: Secondary | ICD-10-CM

## 2021-11-04 MED ORDER — CALCITRIOL 0.25 MCG PO CAPS: ORAL_CAPSULE | ORAL | 0 refills | Status: DC

## 2021-11-06 ENCOUNTER — Other Ambulatory Visit: Payer: Self-pay | Admitting: Internal Medicine

## 2021-11-06 DIAGNOSIS — I10 Essential (primary) hypertension: Secondary | ICD-10-CM

## 2021-11-15 ENCOUNTER — Other Ambulatory Visit: Payer: Self-pay | Admitting: Internal Medicine

## 2021-11-16 ENCOUNTER — Other Ambulatory Visit: Payer: Self-pay | Admitting: Internal Medicine

## 2021-11-16 DIAGNOSIS — I10 Essential (primary) hypertension: Secondary | ICD-10-CM

## 2021-11-16 NOTE — Telephone Encounter (Signed)
Requested Prescriptions  ?Pending Prescriptions Disp Refills  ?? metFORMIN (GLUCOPHAGE) 500 MG tablet [Pharmacy Med Name: METFORMIN HCL 500 MG TABLET] 90 tablet 1  ?  Sig: TAKE 1 TABLET BY MOUTH EVERY DAY WITH BREAKFAST  ?  ? Endocrinology:  Diabetes - Biguanides Failed - 11/15/2021  9:31 AM  ?  ?  Failed - HBA1C is between 0 and 7.9 and within 180 days  ?  Hgb A1c MFr Bld  ?Date Value Ref Range Status  ?05/14/2021 7.5 (H) 4.8 - 5.6 % Final  ?  Comment:  ?           Prediabetes: 5.7 - 6.4 ?         Diabetes: >6.4 ?         Glycemic control for adults with diabetes: <7.0 ?  ?   ?  ?  Failed - B12 Level in normal range and within 720 days  ?  No results found for: VITAMINB12   ?  ?  Failed - Valid encounter within last 6 months  ?  Recent Outpatient Visits   ?      ? 6 months ago Essential hypertension  ? [place] Ladell Pier, MD  ? 1 year ago Essential hypertension  ? [place] Ladell Pier, MD  ? 1 year ago Essential hypertension  ? [place] Ladell Pier, MD  ? 2 years ago Essential hypertension  ? [place] Ladell Pier, MD  ? 3 years ago Essential hypertension  ? [place] Ladell Pier, MD  ?  ?  ?Future Appointments   ?        ? In 2 weeks Ladell Pier, MD [place]  ?  ? ?  ?  ?  Failed - CBC within normal limits and completed in the last 12 months  ?  WBC  ?Date Value Ref Range Status  ?05/14/2021 9.6 3.4 - 10.8 x10E3/uL Final  ?10/15/2016 13.1 (H) 4.0 - 10.5 K/uL Final  ? ?RBC  ?Date Value Ref Range Status  ?05/14/2021 4.50 4.14 - 5.80 x10E6/uL Final  ?10/15/2016 4.31 4.22 - 5.81 MIL/uL Final  ? ?Hemoglobin  ?Date Value Ref Range Status  ?05/14/2021 13.8 13.0 - 17.7 g/dL Final  ? ?Hematocrit  ?Date Value Ref Range Status  ?05/14/2021 40.9 37.5 - 51.0 % Final  ? ?MCHC  ?Date Value Ref  Range Status  ?05/14/2021 33.7 31.5 - 35.7 g/dL Final  ?10/15/2016 32.7 30.0 - 36.0 g/dL Final  ? ?MCH  ?Date Value Ref Range Status  ?05/14/2021 30.7 26.6 - 33.0 pg Final  ?10/15/2016 29.9 26.0 - 34.0 pg Final  ? ?MCV  ?Date Value Ref Range Status  ?05/14/2021 91 79 - 97 fL Final  ? ?No results found for: PLTCOUNTKUC, LABPLAT, [place] ?RDW  ?Date Value Ref Range Status  ?05/14/2021 13.8 11.6 - 15.4 % Final  ? ?  ?  ?  Passed - Cr in normal range and within 360 days  ?  Creat  ?Date Value Ref Range Status  ?09/30/2016 0.99 0.60 - 1.35 mg/dL Final  ? ?Creatinine, Ser  ?Date Value Ref Range Status  ?07/15/2021 1.11 0.40 - 1.50 mg/dL Final  ? ?Creatinine, Urine  ?Date Value Ref Range Status  ?08/26/2014 143.5 mg/dL Final  ?  Comment:  ?  No reference range  established.  ?   ?  ?  Passed - eGFR in normal range and within 360 days  ?  GFR, Est African American  ?Date Value Ref Range Status  ?08/26/2014 >89 mL/min Final  ? ?GFR calc Af Wyvonnia Lora  ?Date Value Ref Range Status  ?07/09/2019 100 >59 mL/min/1.73 Final  ? ?GFR, Est Non African American  ?Date Value Ref Range Status  ?08/26/2014 >89 mL/min Final  ?  Comment:  ?    ?The estimated GFR is a calculation valid for adults (>=45 years old) ?that uses the CKD-EPI algorithm to adjust for age and sex. It is   ?not to be used for children, pregnant women, hospitalized patients,    ?patients on dialysis, or with rapidly changing kidney function. ?According to the NKDEP, eGFR >89 is normal, 60-89 shows mild ?impairment, 30-59 shows moderate impairment, 15-29 shows severe ?impairment and <15 is ESRD. ?  ?  ? ?GFR calc non Af Amer  ?Date Value Ref Range Status  ?07/09/2019 86 >59 mL/min/1.73 Final  ? ?GFR  ?Date Value Ref Range Status  ?07/15/2021 81.94 >60.00 mL/min Final  ?  Comment:  ?  Calculated using the CKD-EPI Creatinine Equation (2021)  ? ?eGFR  ?Date Value Ref Range Status  ?05/14/2021 91 >59 mL/min/1.73 Final  ?   ?  ?  ? ? ?

## 2021-11-16 NOTE — Telephone Encounter (Signed)
Requested Prescriptions  ?Pending Prescriptions Disp Refills  ?? amLODipine (NORVASC) 10 MG tablet [Pharmacy Med Name: AMLODIPINE BESYLATE 10 MG TAB] 90 tablet 1  ?  Sig: TAKE 1 TABLET BY MOUTH EVERY DAY  ?  ? Cardiovascular: Calcium Channel Blockers 2 Failed - 11/16/2021  2:42 AM  ?  ?  Failed - Last BP in normal range  ?  BP Readings from Last 1 Encounters:  ?07/15/21 (!) 146/82  ?   ?  ?  Failed - Last Heart Rate in normal range  ?  Pulse Readings from Last 1 Encounters:  ?07/15/21 (!) 113  ?   ?  ?  Failed - Valid encounter within last 6 months  ?  Recent Outpatient Visits   ?      ? 6 months ago Essential hypertension  ? [place] Ladell Pier, MD  ? 1 year ago Essential hypertension  ? [place] [employer] Ladell Pier, MD  ? 1 year ago Essential hypertension  ? [place] Ladell Pier, MD  ? 2 years ago Essential hypertension  ? [place] Ladell Pier, MD  ? 3 years ago Essential hypertension  ? [place] Ladell Pier, MD  ?  ?  ?Future Appointments   ?        ? In 2 weeks Ladell Pier, MD [place]  ?  ? ?  ?  ?  ? ? ?

## 2021-11-26 ENCOUNTER — Ambulatory Visit: Payer: BC Managed Care – PPO | Admitting: Physician Assistant

## 2021-11-30 ENCOUNTER — Other Ambulatory Visit: Payer: Self-pay | Admitting: Internal Medicine

## 2021-11-30 DIAGNOSIS — I10 Essential (primary) hypertension: Secondary | ICD-10-CM

## 2021-12-01 ENCOUNTER — Ambulatory Visit (HOSPITAL_BASED_OUTPATIENT_CLINIC_OR_DEPARTMENT_OTHER): Payer: BC Managed Care – PPO | Admitting: Pharmacist

## 2021-12-01 ENCOUNTER — Encounter: Payer: Self-pay | Admitting: Internal Medicine

## 2021-12-01 ENCOUNTER — Ambulatory Visit: Payer: BC Managed Care – PPO | Attending: Internal Medicine | Admitting: Internal Medicine

## 2021-12-01 DIAGNOSIS — E1159 Type 2 diabetes mellitus with other circulatory complications: Secondary | ICD-10-CM

## 2021-12-01 DIAGNOSIS — R000 Tachycardia, unspecified: Secondary | ICD-10-CM | POA: Diagnosis not present

## 2021-12-01 DIAGNOSIS — E209 Hypoparathyroidism, unspecified: Secondary | ICD-10-CM

## 2021-12-01 DIAGNOSIS — E782 Mixed hyperlipidemia: Secondary | ICD-10-CM | POA: Diagnosis not present

## 2021-12-01 DIAGNOSIS — E1169 Type 2 diabetes mellitus with other specified complication: Secondary | ICD-10-CM

## 2021-12-01 DIAGNOSIS — Z79899 Other long term (current) drug therapy: Secondary | ICD-10-CM

## 2021-12-01 DIAGNOSIS — I152 Hypertension secondary to endocrine disorders: Secondary | ICD-10-CM

## 2021-12-01 LAB — GLUCOSE, POCT (MANUAL RESULT ENTRY): POC Glucose: 106 mg/dl — AB (ref 70–99)

## 2021-12-01 LAB — POCT GLYCOSYLATED HEMOGLOBIN (HGB A1C): HbA1c, POC (prediabetic range): 6.3 % (ref 5.7–6.4)

## 2021-12-01 MED ORDER — LISINOPRIL 40 MG PO TABS: 40.0000 mg | ORAL_TABLET | Freq: Every day | ORAL | 3 refills | Status: DC

## 2021-12-01 MED ORDER — CALCITRIOL 0.25 MCG PO CAPS: ORAL_CAPSULE | ORAL | 0 refills | Status: DC

## 2021-12-01 MED ORDER — ATORVASTATIN CALCIUM 10 MG PO TABS: 10.0000 mg | ORAL_TABLET | Freq: Every day | ORAL | 3 refills | Status: DC

## 2021-12-01 MED ORDER — TRULICITY 0.75 MG/0.5ML ~~LOC~~ SOAJ: 0.7500 mg | SUBCUTANEOUS | 6 refills | Status: DC

## 2021-12-01 MED ORDER — METFORMIN HCL 500 MG PO TABS: ORAL_TABLET | ORAL | 6 refills | Status: DC

## 2021-12-01 MED ORDER — AMLODIPINE BESYLATE 10 MG PO TABS: 10.0000 mg | ORAL_TABLET | Freq: Every day | ORAL | 3 refills | Status: DC

## 2021-12-01 NOTE — Progress Notes (Signed)
Patient was educated on the use of the Trulicity pen. Reviewed necessary supplies and operation of the pen. Also reviewed goal blood glucose levels. Patient was able to demonstrate use. All questions and concerns were addressed. ? ?Time spent counseling: 10 minutes face-to-face ? ?Benard Halsted, PharmD, BCACP, CPP ?Clinical Pharmacist ?[place] ?[phone] ? ?

## 2021-12-01 NOTE — Patient Instructions (Addendum)
Start the Trulicity and take it once a week as discussed.  Please let me know if you develop any vomiting or upper abdominal pain while on the medication. ? ?Stop metformin after you have been on the Trulicity for 2 weeks.  Continue to monitor your blood sugars at least once a day before breakfast with goal being 90-130. ? ?Aim to get in about 30 minutes of moderate intensity exercise like brisk walking at least 5 days a week. ?

## 2021-12-01 NOTE — Progress Notes (Signed)
? ? ?Patient ID: Peter Hahn, male    DOB: 07/21/1981  MRN: [id number] ? ?CC: Hypertension, Diabetes, and Medication Refill ? ? ?Subjective: ?Peter Hahn is a 41 y.o. male who presents for chronic ds management ?His concerns today include:  ?Pt with hx of DM type 2, HTN, HL, morbid obesity, gen anxiety, allergic rhinitis, hypoparathyroid and eczema.   ? 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  Also on Citracal 400 mg QID. ?He tells me that Dr. Loanne Drilling has left a retired.  He plans to stop at their office today to schedule a follow-up appointment with one of their other endocrinologist. ? ?HYPERTENSION ?Currently taking: see medication list norvasc 10 mg and Lisinopril 40 mg.  Took meds already this a.m ?Med Adherence: _0  Yes   ?Medication side effects: _1  Yes    _2  No ?Adherence with salt restriction: _3  Yes    _4  No ?Home Monitoring?: _5  Yes    _6  No ?Monitoring Frequency: 2-3 times/wk.  Does not have log ?Home BP results range: reports his range usually at goal ?SOB? _7  Yes    _8  No ?Chest Pain?: _9  Yes    _10  No ?Leg swelling?: _11  Yes    _12  No ?Headaches?: _13  Yes    _14  No ?Dizziness? _15  Yes    _16  No ?Comments:  ? ?DIABETES TYPE 2/Obesity ?Last A1C:   ?Results for orders placed or performed in visit on 12/01/21  ?Glucose (CBG)  ?Result Value Ref Range  ? POC Glucose 106 (A) 70 - 99 mg/dl  ?HgB A1c  ?Result Value Ref Range  ? Hemoglobin A1C    ? HbA1c POC (<> result, manual entry)    ? HbA1c, POC (prediabetic range) 6.3 5.7 - 6.4 %  ? HbA1c, POC (controlled diabetic range)    ?  ?Med Adherence:  _17  Yes Metformin 500 mg daily ?Medication side effects:  _18  Yes    _19  No ?Home Monitoring?  _20  Yes   BID before meal and 2 hrs after a meal ?Home glucose results range: after meal range 130-140, before meal 100-110 ?Diet Adherence: _21  Yes .  Done 16 lbs since last visit with me 05/2021.  He tells me he has cut out sugar.  Moving more at work ?Exercise:  _22  Yes    _23  No ?Hypoglycemic episodes?: _24  Yes    _25  No ?Numbness of the feet? _26  Yes    _27  No ?Retinopathy hx? _28  Yes    _29  No ?Last eye exam:  had eye exam 07/2021 at [facility].  No retinopathy.  ?Comments:  ? ?HL:  taking and tolerating Lipitor ? ?Flare of allergies recently due to pollen and ragweed. Taking Zyzol and Flonase OTC with good results. ? ?Pulse always elevated in the low 100s at our office visits.  Patient states it is because he gets nervous when he comes to physician's office.  Dr. Loanne Drilling recently checked TSH and it was normal.  Patient tells me that his blood pressure device at home also checks the pulse and that his pulse rate is normally in the 80s but only elevated when he comes to see a doctor.  He denies any feelings of palpitations. ? ?Patient Active Problem List  ? Diagnosis Date Noted  ? Type 2 diabetes mellitus with morbid obesity ([place]) 12/01/2021  ? Eczema 08/15/2015  ? Dyshidrotic eczema 06/23/2015  ? Generalized anxiety disorder 10/04/2014  ? HTN (hypertension) 08/26/2014  ? Morbid  obesity ([place]) 08/26/2014  ? Allergic rhinitis 08/26/2014  ? Hypoparathyroidism ([place])   ?  ? ?Current Outpatient Medications on File Prior to Visit  ?Medication Sig Dispense Refill  ? Blood Glucose Monitoring Suppl (CONTOUR NEXT MONITOR) w/Device KIT UAD 1 kit 0  ? Calcium Citrate (CITRACAL PO) Take 400 mg by mouth 4 (four) times daily.    ? fluticasone (FLONASE) 50 MCG/ACT nasal spray Place 2 sprays into both nostrils daily. 16 g 6  ? glucose blood test strip Use as instructed 100 each 12  ? Lancets MISC UAD 100 each 3  ? acetaminophen (TYLENOL) 325 MG tablet Take 2 tablets (650 mg total) by mouth every 6 (six) hours as needed. 60 tablet 0  ? albuterol (VENTOLIN HFA) 108 (90 Base) MCG/ACT inhaler Inhale 2 puffs into the lungs every 6 (six) hours as needed for wheezing or shortness of breath. (Patient not taking: Reported on 05/14/2021) 8 g 0  ? ?No current facility-administered medications on  file prior to visit.  ? ? ?Allergies  ?Allergen Reactions  ? Diltiazem   ?  Body aches, chills  ? Pollen Extract   ?  Sneezing, headaches  ? ? ?Social History  ? ?Socioeconomic History  ? Marital status: Married  ?  Spouse name: Not on file  ? Number of children: 0  ? Years of education: Not on file  ? Highest education level: Not on file  ?Occupational History  ? Not on file  ?Tobacco Use  ? Smoking status: Never  ? Smokeless tobacco: Never  ?Substance and Sexual Activity  ? Alcohol use: No  ?  Alcohol/week: 0.0 standard drinks  ? Drug use: No  ?  Frequency: 1.0 times per week  ? Sexual activity: Yes  ?  Partners: Female  ?  Birth control/protection: Condom  ?Other Topics Concern  ? Not on file  ?Social History Narrative  ? Not on file  ? ?Social Determinants of Health  ? ?Financial Resource Strain: Not on file  ?Food Insecurity: Not on file  ?Transportation Needs: Not on file  ?Physical Activity: Not on file  ?Stress: Not on file  ?Social Connections: Not on file  ?Intimate Partner Violence: Not on file  ? ? ?Family History  ?Problem Relation Age of Onset  ? Diabetes Mother   ? Diabetes Father   ? Hypertension Father   ? Hypertension Paternal Grandmother   ? ? ?No past surgical history on file. ? ?ROS: ?Review of Systems ?Negative except as stated above ? ?PHYSICAL EXAM: ?BP 131/82   Pulse (!) 107   Temp 98.5 ?F (36.9 ?C) (Oral)   Resp 20   Ht _0  (1.778 m)   Wt (!) 361 lb (163.7 kg)   SpO2 99%   BMI 51.80 kg/m?   ?Wt Readings from Last 3 Encounters:  ?12/01/21 (!) 361 lb (163.7 kg)  ?07/15/21 (!) 365 lb 9.6 oz (165.8 kg)  ?07/15/21 (!) 365 lb 9.6 oz (165.8 kg)  ? ? ?Physical Exam ? ? ?General appearance - alert, well appearing, and in no distress ?Mental status - normal mood, behavior, speech, dress, motor activity, and thought processes ?Neck - supple, no significant adenopathy ?Chest - clear to auscultation, no wheezes, rales or rhonchi, symmetric air entry ?Heart -mild tachycardia.  Regular rhythm.   Normal S1, S2, no murmurs, rubs, clicks or gallops ?Extremities - peripheral pulses normal, no pedal edema, no clubbing or cyanosis ?Diabetic Foot Exam - Simple   ?Simple Foot Form ?Diabetic Foot exam  was performed with the following findings: Yes 12/01/2021  9:52 AM  ?Visual Inspection ?No deformities, no ulcerations, no other skin breakdown bilaterally: Yes ?Sensation Testing ?Intact to touch and monofilament testing bilaterally: Yes ?Pulse Check ?Posterior Tibialis and Dorsalis pulse intact bilaterally: Yes ?Comments ?  ? ? ? ?  Latest Ref Rng & Units 07/15/2021  ?  8:21 AM 05/14/2021  ?  2:14 PM 07/09/2019  ?  8:46 AM  ?CMP  ?Glucose 70 - 99 mg/dL 112   129   97    ?BUN 6 - 23 mg/dL _0 ?Creatinine 0.40 - 1.50 mg/dL 1.11   1.05   1.07    ?Sodium 135 - 145 mEq/L 141   142   140    ?Potassium 3.5 - 5.1 mEq/L 4.2   4.1   4.5    ?Chloride 96 - 112 mEq/L 101   102   100    ?CO2 19 - 32 mEq/L _1 ?Calcium 8.4 - 10.5 mg/dL 9.0    ? 9.2   8.8   8.4    ?Total Protein 6.0 - 8.5 g/dL  7.1   7.0    ?Total Bilirubin 0.0 - 1.2 mg/dL  0.2   0.3    ?Alkaline Phos 44 - 121 IU/L  91   86    ?AST 0 - 40 IU/L  15   17    ?ALT 0 - 44 IU/L  20   16    ? ?Lipid Panel  ?   ?Component Value Date/Time  ? CHOL 157 05/14/2021 1414  ? TRIG 107 05/14/2021 1414  ? HDL 38 (L) 05/14/2021 1414  ? CHOLHDL 4.1 05/14/2021 1414  ? CHOLHDL 5.9 08/26/2014 1459  ? VLDL 26 08/26/2014 1459  ? [place] 99 05/14/2021 1414  ? ? ?CBC ?   ?Component Value Date/Time  ? WBC 9.6 05/14/2021 1414  ? WBC 13.1 (H) 10/15/2016 2016  ? RBC 4.50 05/14/2021 1414  ? RBC 4.31 10/15/2016 2016  ? HGB 13.8 05/14/2021 1414  ? HCT 40.9 05/14/2021 1414  ? PLT 309 05/14/2021 1414  ? MCV 91 05/14/2021 1414  ? MCH 30.7 05/14/2021 1414  ? MCH 29.9 10/15/2016 2016  ? MCHC 33.7 05/14/2021 1414  ? MCHC 32.7 10/15/2016 2016  ? RDW 13.8 05/14/2021 1414  ? LYMPHSABS 1,887 09/30/2016 1411  ? MONOABS 444 09/30/2016 1411  ? EOSABS 111 09/30/2016 1411  ? BASOSABS 0  09/30/2016 1411  ? ? ?ASSESSMENT AND PLAN: ?1. Type 2 diabetes mellitus with morbid obesity ([place]) ?At goal.  Commended him on losing 16 pounds since last visit.  Patient would like to get his weight down some more.

## 2021-12-02 ENCOUNTER — Encounter: Payer: Self-pay | Admitting: Internal Medicine

## 2021-12-02 LAB — MICROALBUMIN / CREATININE URINE RATIO
Creatinine, Urine: 138.2 mg/dL
Microalb/Creat Ratio: 3 mg/g creat (ref 0–29)
Microalbumin, Urine: 3.5 ug/mL

## 2021-12-14 ENCOUNTER — Encounter: Payer: Self-pay | Admitting: Internal Medicine

## 2021-12-14 ENCOUNTER — Other Ambulatory Visit: Payer: Self-pay | Admitting: Internal Medicine

## 2021-12-14 MED ORDER — TRIAMCINOLONE ACETONIDE 0.1 % EX CREA
1.0000 | TOPICAL_CREAM | Freq: Two times a day (BID) | CUTANEOUS | 0 refills | Status: DC
Start: 2021-12-14 — End: 2022-02-17

## 2022-01-11 ENCOUNTER — Telehealth: Payer: BC Managed Care – PPO | Admitting: Emergency Medicine

## 2022-01-11 DIAGNOSIS — L0291 Cutaneous abscess, unspecified: Secondary | ICD-10-CM

## 2022-01-11 MED ORDER — SULFAMETHOXAZOLE-TRIMETHOPRIM 800-160 MG PO TABS: 1.0000 | ORAL_TABLET | Freq: Two times a day (BID) | ORAL | 0 refills | Status: DC

## 2022-01-11 NOTE — Progress Notes (Signed)
E Visit for Cellulitis  We are sorry that you are not feeling well. Here is how we plan to help!  Based on what you shared with me it looks like you have an abscess (boil).  Cellulitis looks like areas of skin redness, swelling, and warmth; it develops as a result of bacteria entering under the skin. Fever can be present.   I have prescribed:  Bactrim DS 1 tablet by mouth twice a day for 7 days  Use warm compresses several times a day to the abscess area to help it drain/heal.   Please follow up with your primary care provider later this week to have the abscess rechecked and make sure it is healing.   HOME CARE:  Take your medications as ordered and take all of them, even if the skin irritation appears to be healing.   GET HELP RIGHT AWAY IF:  Symptoms that don't begin to go away within 48 hours. Severe redness persists or worsens If the area turns color, spreads or swells. If it blisters and opens, develops yellow-brown crust or bleeds. You develop a fever or chills. If the pain increases or becomes unbearable.  Are unable to keep fluids and food down.  MAKE SURE YOU   Understand these instructions. Will watch your condition. Will get help right away if you are not doing well or get worse.  Thank you for choosing an e-visit.  Your e-visit answers were reviewed by a board certified advanced clinical practitioner to complete your personal care plan. Depending upon the condition, your plan could have included both over the counter or prescription medications.  Please review your pharmacy choice. Make sure the pharmacy is open so you can pick up prescription now. If there is a problem, you may contact your provider through [employer] and have the prescription routed to another pharmacy.  Your safety is important to [place]. If you have drug allergies check your prescription carefully.   For the next 24 hours you can use MyChart to ask questions about today's visit, request a  non-urgent call back, or ask for a work or school excuse. You will get an email in the next two days asking about your experience. I hope that your e-visit has been valuable and will speed your recovery.  I have spent 5 minutes in review of e-visit questionnaire, review and updating patient chart, medical decision making and response to patient.   Rica Mast, PhD, FNP-BC

## 2022-01-25 ENCOUNTER — Other Ambulatory Visit: Payer: Self-pay | Admitting: Family

## 2022-02-12 ENCOUNTER — Other Ambulatory Visit: Payer: Self-pay | Admitting: Internal Medicine

## 2022-02-15 ENCOUNTER — Telehealth: Payer: BC Managed Care – PPO | Admitting: Emergency Medicine

## 2022-02-15 DIAGNOSIS — L309 Dermatitis, unspecified: Secondary | ICD-10-CM | POA: Diagnosis not present

## 2022-02-15 MED ORDER — SULFAMETHOXAZOLE-TRIMETHOPRIM 800-160 MG PO TABS: 1.0000 | ORAL_TABLET | Freq: Two times a day (BID) | ORAL | 0 refills | Status: DC

## 2022-02-15 NOTE — Telephone Encounter (Signed)
Requested medications are due for refill today.  yes  Requested medications are on the active medications list.  yes  Last refill. 12/14/2021 30g 0 refills  Future visit scheduled.   no  Notes to clinic.  Refill is not delegated.    Requested Prescriptions  Pending Prescriptions Disp Refills   triamcinolone cream (KENALOG) 0.1 % [Pharmacy Med Name: TRIAMCINOLONE 0.1% CREAM] 30 g 0    Sig: Apply 1 application. topically 2 (two) times daily.     Not Delegated - Dermatology:  Corticosteroids Failed - 02/12/2022 11:19 PM      Failed - This refill cannot be delegated      Passed - Valid encounter within last 12 months    Recent Outpatient Visits           2 months ago Encounter for medication review   [facility] [place], Cornelius Moras, RPH-CPP   2 months ago Type 2 diabetes mellitus with morbid obesity [facility])   [facility] Marcine Matar, MD   9 months ago Essential hypertension   [facility] Marcine Matar, MD   1 year ago Essential hypertension   [facility] Marcine Matar, MD   1 year ago Essential hypertension    [facility] Marcine Matar, MD

## 2022-02-15 NOTE — Progress Notes (Signed)
E-Visit for Eczema  Thanks for the additional info.  Let's try another round of Bactrim, but if that fails, you need to be seen in person.  We are sorry that you are not feeling well. Here is how we plan to help! Based on what you shared with me it looks like you have eczema (atopic dermatitis).  Although the cause of eczema is not completely understood, genetics appear to play a strong role, and people with a family history of eczema are at increased risk of developing the condition. In most people with eczema, there is a genetic abnormality in the outermost layer of the skin, called the epidermis   Most people with eczema develop their first symptoms as children, before the age of 53. Intense itching of the skin, patches of redness, small bumps, and skin flaking are common. Scratching can further inflame the skin and worsen the itching. The itchiness may be more noticeable at nighttime.  Eczema commonly affects the back of the neck, the elbow creases, and the backs of the knees. Other affected areas may include the face, wrists, and forearms. The skin may become thickened and darkened, or even scarred, from repeated scratching. Eliminating factors that aggravate your eczema symptoms can help to control the symptoms. Possible triggers may include: ? Cold or dry environments ? Sweating ? Emotional stress or anxiety ? Rapid temperature changes ? Exposure to certain chemicals or cleaning solutions, including soaps and detergents, perfumes and cosmetics, wool or synthetic fibers, dust, sand, and cigarette smoke Keeping your skin hydrated Emollients -- Emollients are creams and ointments that moisturize the skin and prevent it from drying out. The best emollients for people with eczema are thick creams (such as Eucerin, Cetaphil, and Nutraderm) or ointments (such as petroleum jelly, Aquaphor, and Vaseline), which contain little to no water. Emollients are most effective when applied immediately after  bathing. Emollients can be applied twice a day or more often if needed. Lotions contain more water than creams and ointments and are less effective for moisturizing the skin. Bathing -- It is not clear if showers or baths are better for keeping the skin hydrated. Lukewarm baths or showers can hydrate and cool the skin, temporarily relieving itching from eczema. An unscented, mild soap or non-soap cleanser (such as Cetaphil) should be used sparingly. Apply an emollient immediately after bathing or showering to prevent your skin from drying out as a result of water evaporation. Emollient bath additives (products you add to the bath water) have not been found to help relieve symptoms. Hot or long baths (more than 10 to 15 minutes) and showers should be avoided since they can dry out the skin.  Based on what you shared with me you may have a skin infection.    Let's try another round of the Bactrim, however, if this fails, you must be seen in person.  I recommend dilute bleach baths for people with eczema. These baths help to decrease the number of bacteria on the skin that can cause infections or worsen symptoms. To prepare a bleach bath, one-fourth to one-half cup of bleach is placed in a full bathtub (about 40 gallons) of water. Bleach baths are usually taken for 5 to 10 minutes twice per week and should be followed by application of an emollient (listed above). I recommend you take Benadryl 25mg  - 50mg  every 4 hours to control the symptoms (including itching) but if they last over 24 hours it is best that you see an office based provider  for follow up.  HOME CARE: Take lukewarm showers or baths Apply creams and ointments to prevent the skin from drying (Eucerin, Cetaphil, Nutraderm, petroleum jelly, Aquaphor or Vaseline) - these products contain less water than other lotions and are more effective for moisturizing the skin Limit exposure to cold or dry environments, sweating, emotional stress and  anxiety, rapid temperature changes and exposure to chemicals and cleaning products, soaps and detergents, perfumes, cosmetics, wool and synthetic fibers, dust, sand and cigarette- factors which can aggravate eczema symptoms.  Use a hydrocortisone cream once or twice a day Take an antihistamine like Benadryl for widespread rashes that itch.  The adult dosage of Benadryl is 25-50 mg by mouth 4 times daily. Caution: This type of medication may cause sleepiness.  Do not drink alcohol, drive, or operate dangerous machinery while taking antihistamines.  Do not take these medications if you have prostate enlargement.  Read the package instructions thoroughly on all medications that you take.  GET HELP RIGHT AWAY IF: Symptoms that don't go away after treatment. Severe itching that persists. You develop a fever. Your skin begins to drain. You have a sore throat. You become short of breath.  MAKE SURE YOU   Understand these instructions. Will watch your condition. Will get help right away if you are not doing well or get worse.    Thank you for choosing an e-visit.  Your e-visit answers were reviewed by a board certified advanced clinical practitioner to complete your personal care plan. Depending upon the condition, your plan could have included both over the counter or prescription medications.  Please review your pharmacy choice. Make sure the pharmacy is open so you can pick up prescription now. If there is a problem, you may contact your provider through [employer] and have the prescription routed to another pharmacy.  Your safety is important to [place]. If you have drug allergies check your prescription carefully.   For the next 24 hours you can use MyChart to ask questions about today's visit, request a non-urgent call back, or ask for a work or school excuse. You will get an email in the next two days asking about your experience. I hope that your e-visit has been valuable and will speed  your recovery.   Approximately 5 minutes was used in reviewing the patient's chart, questionnaire, prescribing medications, and documentation.

## 2022-03-21 ENCOUNTER — Encounter: Payer: Self-pay | Admitting: Internal Medicine

## 2022-03-21 ENCOUNTER — Other Ambulatory Visit: Payer: Self-pay | Admitting: Internal Medicine

## 2022-03-21 DIAGNOSIS — L309 Dermatitis, unspecified: Secondary | ICD-10-CM

## 2022-03-21 MED ORDER — FLUTICASONE PROPIONATE 50 MCG/ACT NA SUSP: 1.0000 | Freq: Every day | NASAL | 3 refills | Status: DC | PRN

## 2022-03-24 ENCOUNTER — Telehealth: Payer: BC Managed Care – PPO | Admitting: Physician Assistant

## 2022-03-24 DIAGNOSIS — L2084 Intrinsic (allergic) eczema: Secondary | ICD-10-CM

## 2022-03-24 DIAGNOSIS — L700 Acne vulgaris: Secondary | ICD-10-CM

## 2022-03-24 MED ORDER — SULFAMETHOXAZOLE-TRIMETHOPRIM 800-160 MG PO TABS: 1.0000 | ORAL_TABLET | Freq: Two times a day (BID) | ORAL | 0 refills | Status: DC

## 2022-03-24 MED ORDER — MOMETASONE FUROATE 0.1 % EX CREA: TOPICAL_CREAM | Freq: Two times a day (BID) | CUTANEOUS | 0 refills | Status: DC

## 2022-03-24 NOTE — Progress Notes (Signed)
E-Visit for Eczema  We are sorry that you are not feeling well. Here is how we plan to help! Based on what you shared with me it looks like you have eczema (atopic dermatitis).  Although the cause of eczema is not completely understood, genetics appear to play a strong role, and people with a family history of eczema are at increased risk of developing the condition. In most people with eczema, there is a genetic abnormality in the outermost layer of the skin, called the epidermis   Most people with eczema develop their first symptoms as children, before the age of 71. Intense itching of the skin, patches of redness, small bumps, and skin flaking are common. Scratching can further inflame the skin and worsen the itching. The itchiness may be more noticeable at nighttime.  Eczema commonly affects the back of the neck, the elbow creases, and the backs of the knees. Other affected areas may include the face, wrists, and forearms. The skin may become thickened and darkened, or even scarred, from repeated scratching. Eliminating factors that aggravate your eczema symptoms can help to control the symptoms. Possible triggers may include: ? Cold or dry environments ? Sweating ? Emotional stress or anxiety ? Rapid temperature changes ? Exposure to certain chemicals or cleaning solutions, including soaps and detergents, perfumes and cosmetics, wool or synthetic fibers, dust, sand, and cigarette smoke Keeping your skin hydrated Emollients -- Emollients are creams and ointments that moisturize the skin and prevent it from drying out. The best emollients for people with eczema are thick creams (such as Eucerin, Cetaphil, and Nutraderm) or ointments (such as petroleum jelly, Aquaphor, and Vaseline), which contain little to no water. Emollients are most effective when applied immediately after bathing. Emollients can be applied twice a day or more often if needed. Lotions contain more water than creams and  ointments and are less effective for moisturizing the skin. Bathing -- It is not clear if showers or baths are better for keeping the skin hydrated. Lukewarm baths or showers can hydrate and cool the skin, temporarily relieving itching from eczema. An unscented, mild soap or non-soap cleanser (such as Cetaphil) should be used sparingly. Apply an emollient immediately after bathing or showering to prevent your skin from drying out as a result of water evaporation. Emollient bath additives (products you add to the bath water) have not been found to help relieve symptoms. Hot or long baths (more than 10 to 15 minutes) and showers should be avoided since they can dry out the skin.  Based on what you shared with me you may have eczema.   I have prescribed: Mometasone cream Apply topically twice daily for 7 days then decrease to once daily  Based on what you shared with me you may have a skin infection.    Bactrim DS 800-160mg  Take 1 tablet twice daily for 7 days  I recommend dilute bleach baths for people with eczema. These baths help to decrease the number of bacteria on the skin that can cause infections or worsen symptoms. To prepare a bleach bath, one-fourth to one-half cup of bleach is placed in a full bathtub (about 40 gallons) of water. Bleach baths are usually taken for 5 to 10 minutes twice per week and should be followed by application of an emollient (listed above). I recommend you take Benadryl 25mg  - 50mg  every 4 hours to control the symptoms (including itching) but if they last over 24 hours it is best that you see an office based  provider for follow up.  HOME CARE: Take lukewarm showers or baths Apply creams and ointments to prevent the skin from drying (Eucerin, Cetaphil, Nutraderm, petroleum jelly, Aquaphor or Vaseline) - these products contain less water than other lotions and are more effective for moisturizing the skin Limit exposure to cold or dry environments, sweating, emotional  stress and anxiety, rapid temperature changes and exposure to chemicals and cleaning products, soaps and detergents, perfumes, cosmetics, wool and synthetic fibers, dust, sand and cigarette- factors which can aggravate eczema symptoms.  Use a hydrocortisone cream once or twice a day Take an antihistamine like Benadryl for widespread rashes that itch.  The adult dosage of Benadryl is 25-50 mg by mouth 4 times daily. Caution: This type of medication may cause sleepiness.  Do not drink alcohol, drive, or operate dangerous machinery while taking antihistamines.  Do not take these medications if you have prostate enlargement.  Read the package instructions thoroughly on all medications that you take.  GET HELP RIGHT AWAY IF: Symptoms that don't go away after treatment. Severe itching that persists. You develop a fever. Your skin begins to drain. You have a sore throat. You become short of breath.  MAKE SURE YOU   Understand these instructions. Will watch your condition. Will get help right away if you are not doing well or get worse.    Thank you for choosing an e-visit.  Your e-visit answers were reviewed by a board certified advanced clinical practitioner to complete your personal care plan. Depending upon the condition, your plan could have included both over the counter or prescription medications.  Please review your pharmacy choice. Make sure the pharmacy is open so you can pick up prescription now. If there is a problem, you may contact your provider through [employer] and have the prescription routed to another pharmacy.  Your safety is important to [place]. If you have drug allergies check your prescription carefully.   For the next 24 hours you can use MyChart to ask questions about today's visit, request a non-urgent call back, or ask for a work or school excuse. You will get an email in the next two days asking about your experience. I hope that your e-visit has been valuable and  will speed your recovery.   I provided 5 minutes of non face-to-face time during this encounter for chart review and documentation.

## 2022-06-16 ENCOUNTER — Other Ambulatory Visit: Payer: Self-pay | Admitting: Internal Medicine

## 2022-07-14 ENCOUNTER — Other Ambulatory Visit: Payer: Self-pay | Admitting: Internal Medicine

## 2022-10-11 ENCOUNTER — Other Ambulatory Visit: Payer: Self-pay | Admitting: Internal Medicine

## 2022-10-11 DIAGNOSIS — E209 Hypoparathyroidism, unspecified: Secondary | ICD-10-CM

## 2022-10-12 NOTE — Telephone Encounter (Signed)
Requested medication (s) are due for refill today - yes  Requested medication (s) are on the active medication list -yes  Future visit scheduled -no  Last refill: 12/01/21 #90  Notes to clinic: fails lab protocol- over 1 year-2022  Requested Prescriptions  Pending Prescriptions Disp Refills   calcitRIOL (ROCALTROL) 0.25 MCG capsule [Pharmacy Med Name: CALCITRIOL 0.25 MCG CAPSULE] 90 capsule 0    Sig: TAKE [place]     Endocrinology:  Vitamins - Vitamin D Supplementation - calcitriol Failed - 10/11/2022  6:48 PM      Failed - Phosphate in normal range and within 360 days    Phosphorus  Date Value Ref Range Status  07/15/2021 4.2 2.3 - 4.6 mg/dL Final         Failed - PTH in normal range and within 360 days    PTH  Date Value Ref Range Status  07/15/2021 88 (H) 16 - 77 pg/mL Final    Comment:    . Interpretive Guide    Intact PTH           Calcium ------------------    ----------           ------- Normal Parathyroid    Normal               Normal Hypoparathyroidism    Low or Low Normal    Low Hyperparathyroidism    Primary            Normal or High       High    Secondary          High                 Normal or Low    Tertiary           High                 High Non-Parathyroid    Hypercalcemia      Low or Low Normal    High .          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[facility])   [place] Ladell Pier, MD    1 year ago Essential hypertension   [place] Ladell Pier, MD   1 year ago Essential hypertension   [place] Ladell Pier, MD   2 years ago Essential hypertension   [place], MD                 Requested Prescriptions  Pending Prescriptions Disp Refills   calcitRIOL (ROCALTROL) 0.25 MCG capsule [Pharmacy Med Name: CALCITRIOL 0.25 MCG CAPSULE] 90 capsule 0    Sig: TAKE  [place] DAY     Endocrinology:  Vitamins - Vitamin D Supplementation - calcitriol Failed - 10/11/2022  6:48 PM      Failed - Phosphate in normal range and within 360 days    Phosphorus  Date Value Ref Range Status  07/15/2021 4.2 2.3 - 4.6 mg/dL Final         Failed - PTH in normal range and within 360 days    PTH  Date Value Ref Range Status  07/15/2021 88 (H) 16 - 77 pg/mL Final    Comment:    . Interpretive Guide    Intact PTH           Calcium ------------------    ----------           ------- Normal Parathyroid    Normal               Normal Hypoparathyroidism    Low or Low Normal    Low Hyperparathyroidism    Primary            Normal or High       High    Secondary          High                 Normal or Low    Tertiary           High                 High Non-Parathyroid    Hypercalcemia      Low or Low Normal    High .          Failed - Ca in normal range and within 360 days    Calcium  Date Value Ref Range Status  07/15/2021 9.0 8.4 - 10.5 mg/dL Final  07/15/2021 9.2 8.6 - 10.3 mg/dL Final   Calcium, Ion  Date Value Ref Range Status  10/03/2014 0.79 (L) 1.12 - 1.23 mmol/L Final         Passed - Valid encounter within last 12 months    Recent Outpatient Visits           10 months ago Encounter for medication review   [place], [place] L, RPH-CPP   10 months ago Type 2  diabetes mellitus with morbid obesity [facility])   [place] Ladell Pier, MD   1 year ago Essential hypertension   [place] Ladell Pier, MD   1 year ago Essential hypertension   [place] Ladell Pier, MD   2 years ago Essential hypertension   [place] Ladell Pier, MD

## 2022-10-14 ENCOUNTER — Other Ambulatory Visit: Payer: Self-pay | Admitting: Internal Medicine

## 2022-10-14 DIAGNOSIS — E209 Hypoparathyroidism, unspecified: Secondary | ICD-10-CM

## 2022-10-19 ENCOUNTER — Other Ambulatory Visit: Payer: Self-pay | Admitting: Internal Medicine

## 2022-10-19 DIAGNOSIS — E209 Hypoparathyroidism, unspecified: Secondary | ICD-10-CM

## 2022-10-20 ENCOUNTER — Other Ambulatory Visit: Payer: Self-pay | Admitting: Internal Medicine

## 2022-10-20 DIAGNOSIS — E209 Hypoparathyroidism, unspecified: Secondary | ICD-10-CM

## 2022-10-20 MED ORDER — CALCITRIOL 0.25 MCG PO CAPS: ORAL_CAPSULE | ORAL | 1 refills | Status: DC

## 2022-11-10 ENCOUNTER — Other Ambulatory Visit: Payer: Self-pay | Admitting: Internal Medicine

## 2022-11-10 DIAGNOSIS — E1159 Type 2 diabetes mellitus with other circulatory complications: Secondary | ICD-10-CM

## 2022-11-10 NOTE — Telephone Encounter (Signed)
Patient needs OV for additional refills.  Requested Prescriptions  Pending Prescriptions Disp Refills   lisinopril (ZESTRIL) 40 MG tablet [Pharmacy Med Name: LISINOPRIL 40 MG TABLET] 90 tablet 0    Sig: TAKE 1 TABLET BY MOUTH EVERY DAY     Cardiovascular:  ACE Inhibitors Failed - 11/10/2022  2:21 AM      Failed - Cr in normal range and within 180 days    Creat  Date Value Ref Range Status  09/30/2016 0.99 0.60 - 1.35 mg/dL Final   Creatinine, Ser  Date Value Ref Range Status  07/15/2021 1.11 0.40 - 1.50 mg/dL Final   Creatinine, Urine  Date Value Ref Range Status  08/26/2014 143.5 mg/dL Final    Comment:    No reference range established.         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 Marcine Matar, MD   2 years ago Essential hypertension   [place] [facility] Marcine Matar, MD   2 years ago Essential hypertension    [facility] Marcine Matar, MD              Passed - Patient is not pregnant      Passed - Last BP in normal range    BP Readings from Last 1 Encounters:  12/01/21 131/82

## 2022-12-01 ENCOUNTER — Telehealth: Payer: Self-pay

## 2022-12-01 DIAGNOSIS — E201 Pseudohypoparathyroidism: Secondary | ICD-10-CM

## 2022-12-01 NOTE — Telephone Encounter (Signed)
Orders Placed This Encounter  Procedures   T3, free    Standing Status:   Future    Standing Expiration Date:   12/01/2023   T4, free    Standing Status:   Future    Standing Expiration Date:   06/03/2023   Thyroid stimulating immunoglobulin    Standing Status:   Future    Standing Expiration Date:   06/03/2023   TSH    Standing Status:   Future    Standing Expiration Date:   12/01/2023   TRAb (TSH Receptor Binding Antibody)    Standing Status:   Future    Standing Expiration Date:   06/03/2023     Eliezer Champagne

## 2022-12-03 ENCOUNTER — Other Ambulatory Visit: Payer: BC Managed Care – PPO

## 2022-12-07 ENCOUNTER — Other Ambulatory Visit (INDEPENDENT_AMBULATORY_CARE_PROVIDER_SITE_OTHER): Payer: BC Managed Care – PPO

## 2022-12-07 DIAGNOSIS — E201 Pseudohypoparathyroidism: Secondary | ICD-10-CM

## 2022-12-07 LAB — TSH: TSH: 3.62 u[IU]/mL (ref 0.35–5.50)

## 2022-12-07 LAB — T3, FREE: T3, Free: 3.2 pg/mL (ref 2.3–4.2)

## 2022-12-07 LAB — T4, FREE: Free T4: 0.82 ng/dL (ref 0.60–1.60)

## 2022-12-09 ENCOUNTER — Ambulatory Visit: Payer: BC Managed Care – PPO | Admitting: "Endocrinology

## 2022-12-09 ENCOUNTER — Encounter: Payer: Self-pay | Admitting: "Endocrinology

## 2022-12-09 VITALS — BP 160/90 | HR 108 | Ht 70.0 in | Wt 357.8 lb

## 2022-12-09 DIAGNOSIS — E201 Pseudohypoparathyroidism: Secondary | ICD-10-CM

## 2022-12-09 LAB — RENAL FUNCTION PANEL
Albumin: 3.8 g/dL (ref 3.5–5.2)
BUN: 14 mg/dL (ref 6–23)
CO2: 28 mEq/L (ref 19–32)
Calcium: 8.4 mg/dL (ref 8.4–10.5)
Chloride: 101 mEq/L (ref 96–112)
Creatinine, Ser: 1.13 mg/dL (ref 0.40–1.50)
GFR: 79.42 mL/min (ref >60.00)
Glucose, Bld: 117 mg/dL — ABNORMAL HIGH (ref 70–99)
Phosphorus: 3.7 mg/dL (ref 2.3–4.6)
Potassium: 3.9 mEq/L (ref 3.5–5.1)
Sodium: 139 mEq/L (ref 135–145)

## 2022-12-09 LAB — MAGNESIUM: Magnesium: 1.9 mg/dL (ref 1.5–2.5)

## 2022-12-09 LAB — TRAB (TSH RECEPTOR BINDING ANTIBODY): TRAB: <1 IU/L (ref <=2.00)

## 2022-12-09 LAB — VITAMIN D 25 HYDROXY (VIT D DEFICIENCY, FRACTURES): VITD: 37.14 ng/mL (ref 30.00–100.00)

## 2022-12-09 LAB — THYROID STIMULATING IMMUNOGLOBULIN: TSI: <89 % baseline (ref <140)

## 2022-12-09 MED ORDER — CALCITRIOL 0.25 MCG PO CAPS: ORAL_CAPSULE | ORAL | 1 refills | Status: DC

## 2022-12-09 NOTE — Progress Notes (Signed)
Outpatient Endocrinology Note Alt[place], MD    Peter Hahn 16-Feb-1981 [id number]  Referring Provider: Marcine Matar, MD Primary Care Provider: Marcine Matar, MD Reason for consultation: Subjective   Assessment & Plan  Diagnoses and all orders for this visit:  Pseudohypoparathyroidism -     PTH, intact and calcium; Future -     Renal function panel; Future -     Vitamin D 1,25 dihydroxy; Future -     VITAMIN D 25 Hydroxy (Vit-D Deficiency, Fractures); Future -     Magnesium; Future -     Magnesium -     VITAMIN D 25 Hydroxy (Vit-D Deficiency, Fractures) -     Vitamin D 1,25 dihydroxy -     Renal function panel -     PTH, intact and calcium -     calcitRIOL (ROCALTROL) 0.25 MCG capsule; TAKE 1 CAPSULE BY MOUTH EVERY DAY   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   I have reviewed current medications, nurse's notes, allergies, vital signs, past medical and surgical history, family medical history, and social history for this encounter. Counseled patient on symptoms, examination findings, lab findings, imaging results, treatment decisions and monitoring and prognosis. The patient understood the recommendations and agrees with the treatment plan. All questions regarding treatment plan were fully answered.  [place], MD  12/09/22   History of Present Illness HPI  Peter Hahn is a 42 y.o. year old male who presents for evaluation of hypoparathyroidism. Peter Hahn was first diagnosed of pseudohypoparathyroidism at age 42 with seizures.  Prior history: Pseudohypoparathyroidism diagnosed in 1991; he took rocaltrol for approx 9 years, then changed to Vit D3/Ca++ combined pill,  but he does not know the dosage; he was then changed to rocaltrol).   Current regimen: Calcitriol 0.25 mcg qd Calcium citrate 600 mg tid   Did not tolerate calcium carbonate due to side effects   Current symptoms: No numbness/tingling/head ache/abdominal pain/seizures  Gets constipation every now and then    Physical Exam  BP (!) 160/90 (BP Location: Left Arm, Patient Position: Sitting, Cuff Size: Normal) Comment: Just took BP meds before coming  Pulse (!) 108   Ht 5\' 10"  (1.778 m)   Wt (!) 357 lb 12.8 oz (162.3 kg)   BMI 51.34 kg/m    Constitutional: well developed, well nourished Head: normocephalic, atraumatic Eyes: sclera anicteric, no redness Neck: supple Lungs: normal respiratory effort Neurology: alert and oriented Skin: dry, no appreciable rashes Musculoskeletal: no appreciable defects Psychiatric: normal mood and affect   Current Medications Patient's Medications  New Prescriptions   No medications on file  Previous Medications   AMLODIPINE (NORVASC) 10 MG TABLET    Take 1 tablet (10 mg total) by mouth daily.   ATORVASTATIN (LIPITOR) 10 MG TABLET    Take 1 tablet (10 mg total) by mouth daily.   BLOOD GLUCOSE MONITORING SUPPL (CONTOUR NEXT MONITOR) W/DEVICE KIT    UAD   CALCIUM CITRATE (CITRACAL PO)    Take 400 mg by mouth 4 (four) times daily.   DULAGLUTIDE (TRULICITY) 0.75 MG/0.5ML SOPN    Inject 0.75 mg into the skin once a week.   FLUTICASONE (FLONASE) 50 MCG/ACT NASAL SPRAY    PLACE 1  SPRAY INTO BOTH NOSTRILS DAILY AS NEEDED FOR ALLERGIES OR RHINITIS.   GLUCOSE BLOOD TEST STRIP    Use as instructed   LANCETS MISC    UAD   LISINOPRIL (ZESTRIL) 40 MG TABLET    TAKE 1 TABLET BY MOUTH EVERY DAY   METFORMIN (GLUCOPHAGE) 500 MG TABLET    TAKE 1 TABLET BY MOUTH EVERY DAY WITH BREAKFAST   MOMETASONE (ELOCON) 0.1 % CREAM    Apply topically in the morning and at bedtime.   SULFAMETHOXAZOLE-TRIMETHOPRIM (BACTRIM DS) 800-160 MG TABLET    Take 1 tablet by mouth 2 (two)  times daily.   TRIAMCINOLONE CREAM (KENALOG) 0.1 %    APPLY 1 APPLICATION. TOPICALLY 2 (TWO) TIMES DAILY.  Modified Medications   Modified Medication Previous Medication   CALCITRIOL (ROCALTROL) 0.25 MCG CAPSULE calcitRIOL (ROCALTROL) 0.25 MCG capsule      TAKE 1 CAPSULE BY MOUTH EVERY DAY    TAKE 1 CAPSULE BY MOUTH EVERY DAY  Discontinued Medications   No medications on file    Allergies Allergies  Allergen Reactions   Diltiazem     Body aches, chills   Pollen Extract     Sneezing, headaches    Past Medical History Past Medical History:  Diagnosis Date   Allergy    since birth    Hypertension 2006   first told his BP was high, never treated    Obesity    Pseudohypoparathyroidism     Past Surgical History History reviewed. No pertinent surgical history.  Family History family history includes Diabetes in his father and mother; Hypertension in his father and paternal grandmother.  Social History Social History   Socioeconomic History   Marital status: Married    Spouse name: Not on file   Number of children: 0   Years of education: Not on file   Highest education level: Not on file  Occupational History   Not on file  Tobacco Use   Smoking status: Never   Smokeless tobacco: Never  Substance and Sexual Activity   Alcohol use: No    Alcohol/week: 0.0 standard drinks of alcohol   Drug use: No    Frequency: 1.0 times per week   Sexual activity: Yes    Partners: Female    Birth control/protection: Condom  Other Topics Concern   Not on file  Social History Narrative   Not on file   Social Determinants of Health   Financial Resource Strain: Not on file  Food Insecurity: Not on file  Transportation Needs: Not on file  Physical Activity: Not on file  Stress: Not on file  Social Connections: Not on file  Intimate Partner Violence: Not on file    Lab Results  Component Value Date   CHOL 157 05/14/2021   Lab Results  Component Value Date   HDL 38 (L)  05/14/2021   Lab Results  Component Value Date   LDLCALC 99 05/14/2021   Lab Results  Component Value Date   TRIG 107 05/14/2021   Lab Results  Component Value Date   CHOLHDL 4.1 05/14/2021   Lab Results  Component Value Date   CREATININE 1.11 07/15/2021   Lab Results  Component Value Date   GFR 81.94 07/15/2021      Component Value Date/Time   NA 141 07/15/2021 0821   NA 142 05/14/2021 1414   K 4.2 07/15/2021 0821   CL 101 07/15/2021 0821   CO2 30 07/15/2021 0821   GLUCOSE 112 (H) 07/15/2021 1610  BUN 17 07/15/2021 0821   BUN 14 05/14/2021 1414   CREATININE 1.11 07/15/2021 0821   CREATININE 0.99 09/30/2016 1411   CALCIUM 9.0 07/15/2021 0821   CALCIUM 9.2 07/15/2021 0821   PROT 7.1 05/14/2021 1414   ALBUMIN 4.3 05/14/2021 1414   AST 15 05/14/2021 1414   ALT 20 05/14/2021 1414   ALKPHOS 91 05/14/2021 1414   BILITOT 0.2 05/14/2021 1414   GFRNONAA 86 07/09/2019 0846   GFRNONAA >89 08/26/2014 1459   GFRAA 100 07/09/2019 0846   GFRAA >89 08/26/2014 1459      Latest Ref Rng & Units 07/15/2021    8:21 AM 05/14/2021    2:14 PM 07/09/2019    8:46 AM  BMP  Glucose 70 - 99 mg/dL 161  096  97   BUN 6 - 23 mg/dL 17  14  11    Creatinine 0.40 - 1.50 mg/dL 0.45  4.09  8.11   BUN/Creat Ratio 9 - 20  13  10    Sodium 135 - 145 mEq/L 141  142  140   Potassium 3.5 - 5.1 mEq/L 4.2  4.1  4.5   Chloride 96 - 112 mEq/L 101  102  100   CO2 19 - 32 mEq/L 30  24  26    Calcium 8.4 - 10.5 mg/dL 8.6 - 91.4 mg/dL 9.0    9.2  8.8  8.4        Component Value Date/Time   WBC 9.6 05/14/2021 1414   WBC 13.1 (H) 10/15/2016 2016   RBC 4.50 05/14/2021 1414   RBC 4.31 10/15/2016 2016   HGB 13.8 05/14/2021 1414   HCT 40.9 05/14/2021 1414   PLT 309 05/14/2021 1414   MCV 91 05/14/2021 1414   MCH 30.7 05/14/2021 1414   MCH 29.9 10/15/2016 2016   MCHC 33.7 05/14/2021 1414   MCHC 32.7 10/15/2016 2016   RDW 13.8 05/14/2021 1414   LYMPHSABS 1,887 09/30/2016 1411   MONOABS 444  09/30/2016 1411   EOSABS 111 09/30/2016 1411   BASOSABS 0 09/30/2016 1411   Lab Results  Component Value Date   TSH 3.62 12/07/2022   TSH 3.87 07/15/2021   TSH 2.19 09/30/2016   FREET4 0.82 12/07/2022   FREET4 0.82 07/15/2021     Parts of this note may have been dictated using voice recognition software. There may be variances in spelling and vocabulary which are unintentional. Not all errors are proofread. Please notify the Thereasa Parkin if any discrepancies are noted or if the meaning of any statement is not clear.

## 2022-12-10 LAB — PTH, INTACT AND CALCIUM
Calcium: 8.2 mg/dL — ABNORMAL LOW (ref 8.6–10.3)
PTH: 86 pg/mL — ABNORMAL HIGH (ref 16–77)

## 2022-12-12 LAB — VITAMIN D 1,25 DIHYDROXY
Vitamin D 1, 25 (OH)2 Total: 19 pg/mL (ref 18–72)
Vitamin D2 1, 25 (OH)2: <8 pg/mL
Vitamin D3 1, 25 (OH)2: 19 pg/mL

## 2023-01-03 ENCOUNTER — Other Ambulatory Visit: Payer: Self-pay | Admitting: Internal Medicine

## 2023-01-03 DIAGNOSIS — I152 Hypertension secondary to endocrine disorders: Secondary | ICD-10-CM

## 2023-01-03 DIAGNOSIS — E1169 Type 2 diabetes mellitus with other specified complication: Secondary | ICD-10-CM

## 2023-01-03 NOTE — Telephone Encounter (Signed)
Medication Refill - Medication: amLODipine (NORVASC) 10 MG tablet  metFORMIN (GLUCOPHAGE) 500 MG tablet  Has the patient contacted their pharmacy? Yes.   (Agent: If no, request that the patient contact the pharmacy for the refill. If patient does not wish to contact the pharmacy document the reason why and proceed with request.) (Agent: If yes, when and what did the pharmacy advise?)  Preferred Pharmacy (with phone number or street name):  [id number] [place], [place] - [address]  [address] [place] [address]  Phone: [phone] Fax: [phone]   Has the patient been seen for an appointment in the last year OR does the patient have an upcoming appointment? Yes.    Agent: Please be advised that RX refills may take up to 3 business days. We ask that you follow-up with your pharmacy.

## 2023-01-04 MED ORDER — METFORMIN HCL 500 MG PO TABS
ORAL_TABLET | ORAL | 0 refills | Status: DC
Start: 2023-01-04 — End: 2023-10-17

## 2023-01-04 MED ORDER — AMLODIPINE BESYLATE 10 MG PO TABS
10.0000 mg | ORAL_TABLET | Freq: Every day | ORAL | 0 refills | Status: DC
Start: 2023-01-04 — End: 2023-04-01

## 2023-01-04 NOTE — Telephone Encounter (Signed)
Future OV scheduled.  Requested Prescriptions  Pending Prescriptions Disp Refills   amLODipine (NORVASC) 10 MG tablet 90 tablet 0    Sig: Take 1 tablet (10 mg total) by mouth daily.     Cardiovascular: Calcium Channel Blockers 2 Failed - 01/03/2023  8:52 AM      Failed - Last BP in normal range    BP Readings from Last 1 Encounters:  12/09/22 (!) 160/90         Failed - Valid encounter within last 6 months    Recent Outpatient Visits           1 year ago Encounter for medication review   [facility] [place], [place] L, RPH-CPP   1 year ago Type 2 diabetes mellitus with morbid obesity [facility])   [place] [facility] Marcine Matar, MD   1 year ago Essential hypertension   [place] [facility] Marcine Matar, MD   2 years ago Essential hypertension   [facility] Jonah Blue B, MD   2 years ago Essential hypertension   [place] [facility] Marcine Matar, MD       Future Appointments             In 2 months Marcine Matar, MD [facility]            Passed - Last Heart Rate in normal range    Pulse Readings from Last 1 Encounters:  12/09/22 (!) 108          metFORMIN (GLUCOPHAGE) 500 MG tablet 180 tablet 0    Sig: TAKE 1 TABLET BY MOUTH EVERY DAY WITH BREAKFAST     Endocrinology:  Diabetes - Biguanides Failed - 01/03/2023  8:52 AM      Failed - HBA1C is between 0 and 7.9 and within 180 days    HbA1c, POC (prediabetic range)  Date Value Ref Range Status  12/01/2021 6.3 5.7 - 6.4 % Final         Failed - B12 Level in normal range and within 720 days    No results found for: "VITAMINB12"       Failed - Valid encounter within last 6 months    Recent Outpatient Visits           1 year ago Encounter for medication review   [facility]  Center [place], [place] L, RPH-CPP   1 year ago Type 2 diabetes mellitus with morbid obesity [facility])   [place] [facility] Marcine Matar, MD   1 year ago Essential hypertension   [facility] Marcine Matar, MD   2 years ago Essential hypertension   [place] [facility] Marcine Matar, MD   2 years ago Essential hypertension   [place] [facility] Marcine Matar, MD       Future Appointments             In 2 months Marcine Matar, MD  [facility]            Failed - CBC within normal limits and completed in the last 12 months    WBC  Date Value Ref Range Status  05/14/2021 9.6 3.4 -  10.8 x10E3/uL Final  10/15/2016 13.1 (H) 4.0 - 10.5 K/uL Final   RBC  Date Value Ref Range Status  05/14/2021 4.50 4.14 - 5.80 x10E6/uL Final  10/15/2016 4.31 4.22 - 5.81 MIL/uL Final   Hemoglobin  Date Value Ref Range Status  05/14/2021 13.8 13.0 - 17.7 g/dL Final   Hematocrit  Date Value Ref Range Status  05/14/2021 40.9 37.5 - 51.0 % Final   MCHC  Date Value Ref Range Status  05/14/2021 33.7 31.5 - 35.7 g/dL Final  16/05/9603 54.0 30.0 - 36.0 g/dL Final   [facility]  Date Value Ref Range Status  05/14/2021 30.7 26.6 - 33.0 pg Final  10/15/2016 29.9 26.0 - 34.0 pg Final   MCV  Date Value Ref Range Status  05/14/2021 91 79 - 97 fL Final   No results found for: "PLTCOUNTKUC", "LABPLAT", "POCPLA" RDW  Date Value Ref Range Status  05/14/2021 13.8 11.6 - 15.4 % Final         Passed - Cr in normal range and within 360 days    Creat  Date Value Ref Range Status  09/30/2016 0.99 0.60 - 1.35 mg/dL Final   Creatinine, Ser  Date Value Ref Range Status  12/09/2022 1.13 0.40 - 1.50 mg/dL Final   Creatinine, Urine  Date Value Ref Range Status  08/26/2014 143.5 mg/dL Final    Comment:    No reference range  established.         Passed - eGFR in normal range and within 360 days    GFR, Est African American  Date Value Ref Range Status  08/26/2014 >89 mL/min Final   GFR calc Af Amer  Date Value Ref Range Status  07/09/2019 100 >59 mL/min/1.73 Final   GFR, Est Non African American  Date Value Ref Range Status  08/26/2014 >89 mL/min Final    Comment:      The estimated GFR is a calculation valid for adults (>=9 years old) that uses the CKD-EPI algorithm to adjust for age and sex. It is   not to be used for children, pregnant women, hospitalized patients,    patients on dialysis, or with rapidly changing kidney function. According to the NKDEP, eGFR >89 is normal, 60-89 shows mild impairment, 30-59 shows moderate impairment, 15-29 shows severe impairment and <15 is ESRD.      GFR calc non Af Amer  Date Value Ref Range Status  07/09/2019 86 >59 mL/min/1.73 Final   GFR  Date Value Ref Range Status  12/09/2022 79.42 >60.00 mL/min Final    Comment:    Calculated using the CKD-EPI Creatinine Equation (2021)   eGFR  Date Value Ref Range Status  05/14/2021 91 >59 mL/min/1.73 Final

## 2023-01-26 ENCOUNTER — Other Ambulatory Visit: Payer: Self-pay | Admitting: "Endocrinology

## 2023-01-26 DIAGNOSIS — E201 Pseudohypoparathyroidism: Secondary | ICD-10-CM

## 2023-02-01 ENCOUNTER — Telehealth: Payer: BC Managed Care – PPO | Admitting: Family Medicine

## 2023-02-01 DIAGNOSIS — J069 Acute upper respiratory infection, unspecified: Secondary | ICD-10-CM

## 2023-02-02 MED ORDER — BENZONATATE 100 MG PO CAPS
200.0000 mg | ORAL_CAPSULE | Freq: Three times a day (TID) | ORAL | 0 refills | Status: DC | PRN
Start: 2023-02-02 — End: 2023-07-28

## 2023-02-02 MED ORDER — FLUTICASONE PROPIONATE 50 MCG/ACT NA SUSP
2.0000 | Freq: Every day | NASAL | 0 refills | Status: DC
Start: 2023-02-02 — End: 2023-09-24

## 2023-02-02 NOTE — Progress Notes (Signed)

## 2023-02-22 ENCOUNTER — Other Ambulatory Visit: Payer: Self-pay | Admitting: Internal Medicine

## 2023-02-22 DIAGNOSIS — I152 Hypertension secondary to endocrine disorders: Secondary | ICD-10-CM

## 2023-03-08 ENCOUNTER — Other Ambulatory Visit: Payer: BC Managed Care – PPO

## 2023-03-11 ENCOUNTER — Ambulatory Visit: Payer: BC Managed Care – PPO | Admitting: "Endocrinology

## 2023-03-12 ENCOUNTER — Other Ambulatory Visit: Payer: Self-pay | Admitting: Internal Medicine

## 2023-03-12 DIAGNOSIS — E1159 Type 2 diabetes mellitus with other circulatory complications: Secondary | ICD-10-CM

## 2023-03-14 NOTE — Telephone Encounter (Signed)
Requested medication (s) are due for refill today: yes  Requested medication (s) are on the active medication list: yes  Last refill:  02/23/23 #30 0 refills  Future visit scheduled: yes in 1 week   Notes to clinic:  do you want to refill another courtesy refill?     Requested Prescriptions  Pending Prescriptions Disp Refills   lisinopril (ZESTRIL) 40 MG tablet [Pharmacy Med Name: LISINOPRIL 40 MG TABLET] 30 tablet 0    Sig: TAKE 1 TABLET BY MOUTH EVERY DAY     Cardiovascular:  ACE Inhibitors Failed - 03/12/2023 10:55 AM      Failed - Last BP in normal range    BP Readings from Last 1 Encounters:  12/09/22 (!) 160/90         Failed - Valid encounter within last 6 months    Recent Outpatient Visits           1 year ago Encounter for medication review   [facility] [place], [place] L, RPH-CPP   1 year ago Type 2 diabetes mellitus with morbid obesity [facility])   [place] [facility] Marcine Matar, MD   1 year ago Essential hypertension   [place] [facility] Marcine Matar, MD   2 years ago Essential hypertension   [place] [facility] Marcine Matar, MD   2 years ago Essential hypertension   [place] [facility] Marcine Matar, MD       Future Appointments             In 1 week Marcine Matar, MD [place] [facility]            Passed - Cr in normal range and within 180 days    Creat  Date Value Ref Range Status  09/30/2016 0.99 0.60 - 1.35 mg/dL Final   Creatinine, Ser  Date Value Ref Range Status  12/09/2022 1.13 0.40 - 1.50 mg/dL Final   Creatinine, Urine  Date Value Ref Range Status  08/26/2014 143.5 mg/dL Final    Comment:    No reference range established.         Passed - K in normal range and within 180 days    Potassium  Date Value Ref Range Status  12/09/2022  3.9 3.5 - 5.1 mEq/L Final         Passed - Patient is not pregnant

## 2023-03-25 ENCOUNTER — Ambulatory Visit: Payer: BC Managed Care – PPO | Admitting: Internal Medicine

## 2023-04-01 ENCOUNTER — Other Ambulatory Visit: Payer: Self-pay | Admitting: Internal Medicine

## 2023-04-01 DIAGNOSIS — I152 Hypertension secondary to endocrine disorders: Secondary | ICD-10-CM

## 2023-04-24 ENCOUNTER — Other Ambulatory Visit: Payer: Self-pay | Admitting: Internal Medicine

## 2023-04-24 DIAGNOSIS — I152 Hypertension secondary to endocrine disorders: Secondary | ICD-10-CM

## 2023-05-05 ENCOUNTER — Encounter: Payer: Self-pay | Admitting: "Endocrinology

## 2023-05-05 ENCOUNTER — Ambulatory Visit: Payer: BC Managed Care – PPO | Admitting: "Endocrinology

## 2023-05-05 VITALS — BP 160/80 | HR 117 | Ht 70.0 in | Wt 359.2 lb

## 2023-05-05 DIAGNOSIS — E201 Pseudohypoparathyroidism: Secondary | ICD-10-CM

## 2023-05-05 MED ORDER — CALCITRIOL 0.25 MCG PO CAPS
ORAL_CAPSULE | ORAL | 1 refills | Status: DC
Start: 2023-05-05 — End: 2023-12-30

## 2023-05-05 NOTE — Progress Notes (Signed)
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   I have reviewed current medications, nurse's notes, allergies, vital signs, past medical and surgical history, family medical history, and social history for this encounter. Counseled patient on symptoms, examination findings, lab findings, imaging results, treatment decisions and monitoring and prognosis. The patient understood the recommendations and agrees with the treatment plan. All questions regarding treatment plan were fully answered.  Peter Poqu[place]on, MD  05/05/23   History of Present Illness HPI  Fed Ceci is a 42 y.o. year old male who presents for follow up of hypoparathyroidism. Osman Calzadilla was first diagnosed of pseudohypoparathyroidism at age 44 with seizures.  Feels well with no new complaints  Reports keeping a check of blood pressure at home and reports having "long standing white-coat syndrome", with home readings  <140/90.  Prior history: Pseudohypoparathyroidism diagnosed in 1991; he took rocaltrol for approx 9 years, then changed to Vit D3/Ca++ combined pill, but he does not know the dosage;  he was then changed to rocaltrol).   Current regimen: Calcitriol 0.25 mcg qd Calcium citrate 600 mg tid   Did not tolerate calcium carbonate due to side effects   Current symptoms: No numbness/tingling/head ache/abdominal pain/seizures  Recovered from constipation    Physical Exam  BP (!) 160/93   Pulse (!) 117   Ht 5\' 10"  (1.778 m)   Wt (!) 359 lb 3.2 oz (162.9 kg)   SpO2 95%   BMI 51.54 kg/m    Constitutional: well developed, well nourished Head: normocephalic, atraumatic Eyes: sclera anicteric, no redness Neck: supple Lungs: normal respiratory effort Neurology: alert and oriented Skin: dry, no appreciable rashes Musculoskeletal: no appreciable defects Psychiatric: normal mood and affect   Current Medications Patient's Medications  New Prescriptions   No medications on file  Previous Medications   AMLODIPINE (NORVASC) 10 MG TABLET    TAKE 1 TABLET BY MOUTH EVERY DAY   ATORVASTATIN (LIPITOR) 10 MG TABLET    Take 1 tablet (10 mg total) by mouth daily.   BENZONATATE (TESSALON PERLES) 100 MG CAPSULE    Take 2 capsules (200 mg total) by mouth 3 (three) times daily as needed for cough.   BLOOD GLUCOSE MONITORING SUPPL (CONTOUR NEXT MONITOR) W/DEVICE KIT    UAD   CALCIUM CITRATE (CITRACAL PO)    Take 400 mg by mouth 4 (four) times daily.   DULAGLUTIDE (TRULICITY) 0.75 MG/0.5ML SOPN    Inject 0.75 mg into the skin once a week.   FLUTICASONE (FLONASE) 50 MCG/ACT NASAL SPRAY    Place 2 sprays into both nostrils daily.   GLUCOSE BLOOD  TEST STRIP    Use as instructed   LANCETS MISC    UAD   LISINOPRIL (ZESTRIL) 40 MG TABLET    TAKE 1 TABLET BY MOUTH EVERY DAY   METFORMIN (GLUCOPHAGE) 500 MG TABLET    TAKE 1 TABLET BY MOUTH EVERY DAY WITH BREAKFAST   MOMETASONE (ELOCON) 0.1 % CREAM    Apply topically in the morning and at bedtime.   SULFAMETHOXAZOLE-TRIMETHOPRIM (BACTRIM DS) 800-160 MG TABLET    Take 1 tablet by mouth 2 (two) times daily.   TRIAMCINOLONE CREAM (KENALOG) 0.1 %     APPLY 1 APPLICATION. TOPICALLY 2 (TWO) TIMES DAILY.  Modified Medications   Modified Medication Previous Medication   CALCITRIOL (ROCALTROL) 0.25 MCG CAPSULE calcitRIOL (ROCALTROL) 0.25 MCG capsule      TAKE 1 CAPSULE BY MOUTH EVERY DAY    TAKE 1 CAPSULE BY MOUTH EVERY DAY  Discontinued Medications   No medications on file    Allergies Allergies  Allergen Reactions   Diltiazem     Body aches, chills   Pollen Extract     Sneezing, headaches    Past Medical History Past Medical History:  Diagnosis Date   Allergy    since birth    Hypertension 2006   first told his BP was high, never treated    Obesity    Pseudohypoparathyroidism     Past Surgical History History reviewed. No pertinent surgical history.  Family History family history includes Diabetes in his father and mother; Hypertension in his father and paternal grandmother.  Social History Social History   Socioeconomic History   Marital status: Married    Spouse name: Not on file   Number of children: 0   Years of education: Not on file   Highest education level: Not on file  Occupational History   Not on file  Tobacco Use   Smoking status: Never   Smokeless tobacco: Never  Substance and Sexual Activity   Alcohol use: No    Alcohol/week: 0.0 standard drinks of alcohol   Drug use: No    Frequency: 1.0 times per week   Sexual activity: Yes    Partners: Female    Birth control/protection: Condom  Other Topics Concern   Not on file  Social History Narrative   Not on file   Social Determinants of Health   Financial Resource Strain: Not on file  Food Insecurity: Not on file  Transportation Needs: Not on file  Physical Activity: Not on file  Stress: Not on file  Social Connections: Not on file  Intimate Partner Violence: Not on file    Lab Results  Component Value Date   CHOL 157 05/14/2021   Lab Results  Component Value Date   HDL 38 (L) 05/14/2021   Lab Results  Component Value Date    LDLCALC 99 05/14/2021   Lab Results  Component Value Date   TRIG 107 05/14/2021   Lab Results  Component Value Date   CHOLHDL 4.1 05/14/2021   Lab Results  Component Value Date   CREATININE 1.13 12/09/2022   Lab Results  Component Value Date   GFR 79.42 12/09/2022      Component Value Date/Time   NA 139 12/09/2022 0849   NA 142 05/14/2021 1414   K 3.9 12/09/2022 0849   CL 101 12/09/2022 0849   CO2 28 12/09/2022 0849   GLUCOSE 117 (H) 12/09/2022 0849   BUN 14 12/09/2022 0849   BUN 14 05/14/2021 1414   CREATININE 1.13  12/09/2022 0849   CREATININE 0.99 09/30/2016 1411   CALCIUM 8.4 12/09/2022 0849   CALCIUM 8.2 (L) 12/09/2022 0849   PROT 7.1 05/14/2021 1414   ALBUMIN 3.8 12/09/2022 0849   ALBUMIN 4.3 05/14/2021 1414   AST 15 05/14/2021 1414   ALT 20 05/14/2021 1414   ALKPHOS 91 05/14/2021 1414   BILITOT 0.2 05/14/2021 1414   GFRNONAA 86 07/09/2019 0846   GFRNONAA >89 08/26/2014 1459   GFRAA 100 07/09/2019 0846   GFRAA >89 08/26/2014 1459      Latest Ref Rng & Units 12/09/2022    8:49 AM 07/15/2021    8:21 AM 05/14/2021    2:14 PM  BMP  Glucose 70 - 99 mg/dL 784  696  295   BUN 6 - 23 mg/dL 14  17  14    Creatinine 0.40 - 1.50 mg/dL 2.84  1.32  4.40   BUN/Creat Ratio 9 - 20   13   Sodium 135 - 145 mEq/L 139  141  142   Potassium 3.5 - 5.1 mEq/L 3.9  4.2  4.1   Chloride 96 - 112 mEq/L 101  101  102   CO2 19 - 32 mEq/L 28  30  24    Calcium 8.4 - 10.5 mg/dL 8.6 - 10.2 mg/dL 8.4    8.2  9.0    9.2  8.8        Component Value Date/Time   WBC 9.6 05/14/2021 1414   WBC 13.1 (H) 10/15/2016 2016   RBC 4.50 05/14/2021 1414   RBC 4.31 10/15/2016 2016   HGB 13.8 05/14/2021 1414   HCT 40.9 05/14/2021 1414   PLT 309 05/14/2021 1414   MCV 91 05/14/2021 1414   MCH 30.7 05/14/2021 1414   MCH 29.9 10/15/2016 2016   MCHC 33.7 05/14/2021 1414   MCHC 32.7 10/15/2016 2016   RDW 13.8 05/14/2021 1414   LYMPHSABS 1,887 09/30/2016 1411   MONOABS 444 09/30/2016 1411    EOSABS 111 09/30/2016 1411   BASOSABS 0 09/30/2016 1411   Lab Results  Component Value Date   TSH 3.62 12/07/2022   TSH 3.87 07/15/2021   TSH 2.19 09/30/2016   FREET4 0.82 12/07/2022   FREET4 0.82 07/15/2021     Parts of this note may have been dictated using voice recognition software. There may be variances in spelling and vocabulary which are unintentional. Not all errors are proofread. Please notify the Thereasa Parkin if any discrepancies are noted or if the meaning of any statement is not clear.

## 2023-06-02 ENCOUNTER — Ambulatory Visit: Payer: BC Managed Care – PPO | Admitting: Internal Medicine

## 2023-06-25 ENCOUNTER — Other Ambulatory Visit: Payer: Self-pay | Admitting: Internal Medicine

## 2023-06-25 DIAGNOSIS — E1169 Type 2 diabetes mellitus with other specified complication: Secondary | ICD-10-CM

## 2023-06-28 ENCOUNTER — Other Ambulatory Visit: Payer: Self-pay | Admitting: Internal Medicine

## 2023-06-28 DIAGNOSIS — I152 Hypertension secondary to endocrine disorders: Secondary | ICD-10-CM

## 2023-06-28 NOTE — Telephone Encounter (Signed)
Has appointment. Requested Prescriptions  Pending Prescriptions Disp Refills   amLODipine (NORVASC) 10 MG tablet [Pharmacy Med Name: AMLODIPINE BESYLATE 10 MG TAB] 90 tablet 0    Sig: TAKE 1 TABLET BY MOUTH EVERY DAY     Cardiovascular: Calcium Channel Blockers 2 Failed - 06/28/2023  2:30 AM      Failed - Last BP in normal range    BP Readings from Last 1 Encounters:  05/05/23 (!) 160/80         Failed - Last Heart Rate in normal range    Pulse Readings from Last 1 Encounters:  05/05/23 (!) 117         Failed - Valid encounter within last 6 months    Recent Outpatient Visits           1 year ago Encounter for medication review   [facility] Dept Of [place]. [facility] Lois Huxley, Gideon L, RPH-CPP   1 year ago Type 2 diabetes mellitus with morbid obesity (HCC)   [facility] [facility] - [facility] Dept Of [place]. [facility] Marcine Matar, MD   2 years ago Essential hypertension   [facility] [place] - [facility] Dept Of [place]. [facility] Marcine Matar, MD   2 years ago Essential hypertension   [facility] [place] - A Dept Of [place]. [facility] Marcine Matar, MD   3 years ago Essential hypertension   [place] [facility] [place] [facility] Dept Of [place]. [facility] Marcine Matar, MD       Future Appointments             In 1 month Laural Benes Binnie Rail, MD [facility] [place] - A Dept Of Eligha Bridegroom. [facility]

## 2023-07-23 ENCOUNTER — Telehealth: Payer: BC Managed Care – PPO | Admitting: Physician Assistant

## 2023-07-23 DIAGNOSIS — J069 Acute upper respiratory infection, unspecified: Secondary | ICD-10-CM

## 2023-07-23 NOTE — Progress Notes (Signed)
Because of the duration of your symtptoms, I feel your condition warrants further evaluation and I recommend that you be seen in a face to face visit.   NOTE: There will be NO CHARGE for this eVisit   If you are having a true medical emergency please call 911.      For an urgent face to face visit, [facility] has eight urgent care centers for your convenience:   NEW!! [facility] at [facility] Get Driving Directions [phone] [address] [place], [address]    [facility] at [facility] Get Driving Directions [phone] [address] [place], [place] [address]   [facility] Health Urgent Care Center [facility]) Get Driving Directions [phone] [address] [place], [place] [address]  [facility] Health [facility] Center [facility]) Get Driving Directions [phone] [address] [place],  [place]  [address]  [facility] Health Urgent Care Center [facility] - at [employer]  [phone] [address] [place],  [place] [address]   [facility] Health Urgent Care at [facility] Get Driving Directions [phone] [address] [place], [place] [address]   [facility] Health Urgent Care at [facility] Get Driving Directions  [phone] [address] [place], [place] [address]   [facility] Health Urgent Care at [facility] Directions [phone] [address] [place], [place] [address]  Your MyChart E-visit questionnaire answers were reviewed by a board certified advanced clinical practitioner to complete your personal care plan based on your specific symptoms.  Thank you for using e-Visits.

## 2023-07-28 ENCOUNTER — Encounter (HOSPITAL_COMMUNITY): Payer: Self-pay

## 2023-07-28 ENCOUNTER — Ambulatory Visit (HOSPITAL_COMMUNITY)
Admission: RE | Admit: 2023-07-28 | Discharge: 2023-07-28 | Disposition: A | Discharge status: Home / Self Care | Payer: BC Managed Care – PPO | Source: Ambulatory Visit | Attending: Nurse Practitioner | Admitting: Nurse Practitioner

## 2023-07-28 ENCOUNTER — Ambulatory Visit (INDEPENDENT_AMBULATORY_CARE_PROVIDER_SITE_OTHER): Payer: BC Managed Care – PPO

## 2023-07-28 VITALS — BP 156/99 | HR 105 | Temp 98.7°F | Resp 18

## 2023-07-28 DIAGNOSIS — J069 Acute upper respiratory infection, unspecified: Secondary | ICD-10-CM

## 2023-07-28 DIAGNOSIS — R051 Acute cough: Secondary | ICD-10-CM

## 2023-07-28 DIAGNOSIS — J209 Acute bronchitis, unspecified: Secondary | ICD-10-CM

## 2023-07-28 MED ORDER — BENZONATATE 100 MG PO CAPS
200.0000 mg | ORAL_CAPSULE | Freq: Three times a day (TID) | ORAL | 0 refills | Status: DC | PRN
Start: 2023-07-28 — End: 2023-09-24

## 2023-07-28 MED ORDER — ALBUTEROL SULFATE (2.5 MG/3ML) 0.083% IN NEBU
2.5000 mg | INHALATION_SOLUTION | Freq: Once | RESPIRATORY_TRACT | Status: AC
  Administered 2023-07-28: 2.5 mg via RESPIRATORY_TRACT

## 2023-07-28 MED ORDER — ALBUTEROL SULFATE HFA 108 (90 BASE) MCG/ACT IN AERS
1.0000 | INHALATION_SPRAY | Freq: Four times a day (QID) | RESPIRATORY_TRACT | 0 refills | Status: AC | PRN
Start: 2023-07-28 — End: ?

## 2023-07-28 MED ORDER — PREDNISONE 20 MG PO TABS
20.0000 mg | ORAL_TABLET | Freq: Every day | ORAL | 0 refills | Status: AC
Start: 2023-07-28 — End: 2023-08-02

## 2023-07-28 MED ORDER — ALBUTEROL SULFATE (2.5 MG/3ML) 0.083% IN NEBU
INHALATION_SOLUTION | RESPIRATORY_TRACT | Status: AC
  Filled 2023-07-28: qty 3

## 2023-07-28 NOTE — Discharge Instructions (Signed)
You have bronchitis likely from a viral illness.  We gave you a breathing treatment today which helped open up your airway.  Recommend continuing the albuterol inhaler every 4-6 hours scheduled for the next 2 days, then use as needed.  In addition, start taking the oral prednisone, but keep a close eye on your blood sugars while taking this medicine. Symptoms should improve over the next week to 10 days.  If you develop chest pain or shortness of breath, go to the emergency room.  Some things that can make you feel better are: - Increased rest - Increasing fluid with water/sugar free electrolytes - Acetaminophen and ibuprofen as needed for fever/pain - Salt water gargling, chloraseptic spray and throat lozenges - OTC guaifenesin (Mucinex) 600 mg twice daily for congestion - Saline sinus flushes or a neti pot - Humidifying the air -Tessalon Perles every 8 hours as needed for dry cough

## 2023-07-28 NOTE — ED Provider Notes (Signed)
[facility]    CSN: [id number] Arrival date & time: 07/28/23  0908      History   Chief Complaint Chief Complaint  Patient presents with   Cough    My son was diagnosed with RSV and I think I have it as well. I have been coughing up yellowish phlegm for the past few days, persistent cough, chest congestion and sore throat. - Entered by patient   Appointment    HPI Peter Hahn is a 42 y.o. male.   Patient presents today with 1 week history of congested cough that is worse at night, chest pain when he coughs, chest congestion, runny and stuffy nose, sore throat that is now improved, diarrhea, and fatigue.  He denies fever, body aches or chills, shortness of breath, headache, ear pain, abdominal pain, nausea/vomiting, and change in appetite.  Has been taking Mucinex and Coricidin for symptoms without much improvement.  Reports his 32-year-old son had RSV last week.  Reports he has a personal history of pneumonia approximately 8 years ago.  Denies history of chronic lung disease, denies smoking or vaping.    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 No pertinent surgical history.     Home Medications    Prior to Admission medications   Medication Sig Start Date End Date Taking? Authorizing Provider  albuterol (VENTOLIN HFA) 108 (90 Base) MCG/ACT inhaler Inhale 1-2 puffs into the lungs every 6 (six) hours as needed for wheezing or shortness of breath.  07/28/23  Yes Valentino Nose, NP  predniSONE (DELTASONE) 20 MG tablet Take 1 tablet (20 mg total) by mouth daily with breakfast for 5 days. 07/28/23 08/02/23 Yes Cathlean Marseilles A, NP  amLODipine (NORVASC) 10 MG tablet TAKE 1 TABLET BY MOUTH EVERY DAY 06/28/23   Marcine Matar, MD  atorvastatin (LIPITOR) 10 MG tablet Take 1 tablet (10 mg total) by mouth daily. Patient not taking: Reported on 07/28/2023 12/01/21   Marcine Matar, MD  benzonatate (TESSALON PERLES) 100 MG capsule Take 2 capsules (200 mg total) by mouth 3 (three) times daily as needed for cough. 07/28/23   Valentino Nose, NP  Blood Glucose Monitoring Suppl (CONTOUR NEXT MONITOR) w/Device KIT UAD Patient not taking: Reported on 05/05/2023 05/18/21   Marcine Matar, MD  calcitRIOL (ROCALTROL) 0.25 MCG capsule TAKE 1 CAPSULE BY MOUTH EVERY DAY 05/05/23   Altamese Drake, MD  Calcium Citrate (CITRACAL PO) Take 400 mg by mouth 4 (four) times daily.    [provider]  Dulaglutide (TRULICITY) 0.75 MG/0.5ML SOPN Inject 0.75 mg into the skin once a week. Patient not taking: Reported on 12/09/2022 12/01/21   Marcine Matar, MD  fluticasone [facility]) 50 MCG/ACT nasal spray Place 2 sprays into both nostrils daily. 02/02/23   Freddy Finner, NP  glucose blood test strip Use as instructed 05/18/21   Marcine Matar, MD  Lancets MISC UAD 05/18/21  Marcine Matar, MD  lisinopril (ZESTRIL) 40 MG tablet TAKE 1 TABLET BY MOUTH EVERY DAY 04/27/23   Hoy Register, MD  metFORMIN (GLUCOPHAGE) 500 MG tablet TAKE 1 TABLET BY MOUTH EVERY DAY WITH BREAKFAST 01/04/23   Marcine Matar, MD  mometasone (ELOCON) 0.1 % cream Apply topically in the morning and at bedtime. Patient not taking: Reported on 12/09/2022 03/24/22   Margaretann Loveless, PA-C  triamcinolone cream (KENALOG) 0.1 % APPLY 1 APPLICATION. TOPICALLY 2 (TWO) TIMES DAILY. Patient not taking: Reported on 12/09/2022 02/17/22   Marcine Matar, MD    Family  History Family History  Problem Relation Age of Onset   Diabetes Mother    Diabetes Father    Hypertension Father    Hypertension Paternal Grandmother     Social History Social History   Tobacco Use   Smoking status: Never   Smokeless tobacco: Never  Substance Use Topics   Alcohol use: No    Alcohol/week: 0.0 standard drinks of alcohol   Drug use: No    Frequency: 1.0 times per week     Allergies   Diltiazem and Pollen extract   Review of Systems Review of Systems Per HPI  Physical Exam Triage Vital Signs ED Triage Vitals  Encounter Vitals Group     BP 07/28/23 0922 (!) 156/99     Systolic BP Percentile --      Diastolic BP Percentile --      Pulse Rate 07/28/23 0922 (!) 105     Resp 07/28/23 0922 18     Temp 07/28/23 0922 98.7 F (37.1 C)     Temp Source 07/28/23 0922 Oral     SpO2 07/28/23 0922 93 %     Weight --      Height --      Head Circumference --      Peak Flow --      Pain Score 07/28/23 0921 0     Pain Loc --      Pain Education --      Exclude from Growth Chart --    No data found.  Updated Vital Signs BP (!) 156/99 (BP Location: Right Arm)   Pulse (!) 105   Temp 98.7 F (37.1 C) (Oral)   Resp 18   SpO2 93%   Visual Acuity Right Eye Distance:   Left Eye Distance:   Bilateral Distance:    Right Eye Near:   Left Eye Near:    Bilateral Near:     Physical Exam Vitals and nursing note reviewed.  Constitutional:      General: He is not in acute distress.    Appearance: Normal appearance. He is not ill-appearing or toxic-appearing.  HENT:     Head: Normocephalic and atraumatic.     Right Ear: Tympanic membrane, ear canal and external ear normal.     Left Ear: Tympanic membrane, ear canal and external ear normal.     Nose: Congestion present. No rhinorrhea.     Mouth/Throat:     Mouth: Mucous membranes are moist.     Pharynx: Oropharynx is clear. No oropharyngeal exudate or posterior oropharyngeal erythema.  Eyes:     General:  No scleral icterus.    Extraocular Movements: Extraocular movements intact.  Cardiovascular:     Rate and Rhythm: Normal rate and regular rhythm.  Pulmonary:     Effort: Pulmonary effort is normal. No respiratory distress.     Breath sounds: Examination of the right-upper field reveals  decreased breath sounds. Examination of the left-upper field reveals wheezing. Examination of the right-middle field reveals decreased breath sounds. Examination of the right-lower field reveals decreased breath sounds. Examination of the left-lower field reveals decreased breath sounds. Decreased breath sounds and wheezing present. No rhonchi or rales.  Musculoskeletal:     Cervical back: Normal range of motion and neck supple.  Lymphadenopathy:     Cervical: No cervical adenopathy.  Skin:    General: Skin is warm and dry.     Coloration: Skin is not jaundiced or pale.     Findings: No erythema or rash.  Neurological:     Mental Status: He is alert and oriented to person, place, and time.  Psychiatric:        Behavior: Behavior is cooperative.      UC Treatments / Results  Labs (all labs ordered are listed, but only abnormal results are displayed) Labs Reviewed - No data to display  EKG   Radiology DG Chest 2 View Result Date: 07/28/2023 CLINICAL DATA:  Productive cough for 1 week. EXAM: CHEST - 2 VIEW COMPARISON:  October 15, 2016. FINDINGS: The heart size and mediastinal contours are within normal limits. Both lungs are clear. The visualized skeletal structures are unremarkable. IMPRESSION: No active cardiopulmonary disease. Electronically Signed   By: Lupita Raider M.D.   On: 07/28/2023 10:17    Procedures Procedures (including critical care time)  Medications Ordered in UC Medications  albuterol (PROVENTIL) (2.5 MG/3ML) 0.083% nebulizer solution 2.5 mg (2.5 mg Nebulization Given 07/28/23 0958)    Initial Impression / Assessment and Plan / UC Course  I have reviewed the triage vital  signs and the nursing notes.  Pertinent labs & imaging results that were available during my care of the patient were reviewed by me and considered in my medical decision making (see chart for details).   Patient is mildly hypertensive and slightly tachycardic in triage, otherwise vital signs are stable.  1. Acute bronchitis, unspecified organism 2. Viral URI with cough Suspect acute bronchitis due to viral upper respiratory infection Given length of symptoms, viral testing deferred Chest x-ray obtained and is negative for signs of acute pneumonia Wheezing treated with albuterol in urgent care with improvement in aeration bilaterally, wheezing continued however Patient reported subjective improvement Prescription given for albuterol inhaler to use every 4-6 hours scheduled for 48 hours, then as needed Start low-dose oral prednisone (due to history of diabetes) to help with lung inflammation, antitussive Return and ER precautions discussed with patient  The patient was given the opportunity to ask questions.  All questions answered to their satisfaction.  The patient is in agreement to this plan.    Final Clinical Impressions(s) / UC Diagnoses   Final diagnoses:  Acute bronchitis, unspecified organism     Discharge Instructions      You have bronchitis likely from a viral illness.  We gave you a breathing treatment today which helped open up your airway.  Recommend continuing the albuterol inhaler every 4-6 hours scheduled for the next 2 days, then use as needed.  In addition, start taking the oral prednisone, but keep a close eye on your blood sugars while taking this medicine. Symptoms should improve over the next week to 10 days.  If you develop chest pain or shortness of breath, go to the emergency room.  Some things that can make you feel better are: - Increased rest - Increasing fluid with water/sugar free electrolytes - Acetaminophen and ibuprofen as needed  for fever/pain -  Salt water gargling, chloraseptic spray and throat lozenges - OTC guaifenesin (Mucinex) 600 mg twice daily for congestion - Saline sinus flushes or a neti pot - Humidifying the air -Tessalon Perles every 8 hours as needed for dry cough      ED Prescriptions     Medication Sig Dispense Auth. Provider   benzonatate (TESSALON PERLES) 100 MG capsule Take 2 capsules (200 mg total) by mouth 3 (three) times daily as needed for cough. 30 capsule Cathlean Marseilles A, NP   albuterol (VENTOLIN HFA) 108 (90 Base) MCG/ACT inhaler Inhale 1-2 puffs into the lungs every 6 (six) hours as needed for wheezing or shortness of breath. 6.7 g Cathlean Marseilles A, NP   predniSONE (DELTASONE) 20 MG tablet Take 1 tablet (20 mg total) by mouth daily with breakfast for 5 days. 5 tablet Valentino Nose, NP      PDMP not reviewed this encounter.   Valentino Nose, NP 07/28/23 [phone]

## 2023-07-28 NOTE — ED Triage Notes (Signed)
Pt c/o cough, yellowish phlegm, chest congestion and sore throat since Thursday.   States his son has RSV

## 2023-07-29 ENCOUNTER — Other Ambulatory Visit: Payer: Self-pay | Admitting: Family Medicine

## 2023-07-29 DIAGNOSIS — E1159 Type 2 diabetes mellitus with other circulatory complications: Secondary | ICD-10-CM

## 2023-08-16 ENCOUNTER — Ambulatory Visit: Payer: 59 | Admitting: Internal Medicine

## 2023-08-19 ENCOUNTER — Other Ambulatory Visit: Payer: Self-pay | Admitting: Nurse Practitioner

## 2023-08-22 ENCOUNTER — Other Ambulatory Visit: Payer: Self-pay | Admitting: Internal Medicine

## 2023-08-22 DIAGNOSIS — I152 Hypertension secondary to endocrine disorders: Secondary | ICD-10-CM

## 2023-08-24 MED ORDER — LISINOPRIL 40 MG PO TABS: 40.0000 mg | ORAL_TABLET | Freq: Every day | ORAL | 0 refills | Status: DC

## 2023-09-23 ENCOUNTER — Telehealth: Payer: 59 | Admitting: Physician Assistant

## 2023-09-23 DIAGNOSIS — J069 Acute upper respiratory infection, unspecified: Secondary | ICD-10-CM | POA: Diagnosis not present

## 2023-09-24 MED ORDER — FLUTICASONE PROPIONATE 50 MCG/ACT NA SUSP: 2.0000 | Freq: Every day | NASAL | 0 refills | Status: DC

## 2023-09-24 MED ORDER — BENZONATATE 100 MG PO CAPS: 200.0000 mg | ORAL_CAPSULE | Freq: Three times a day (TID) | ORAL | 0 refills | Status: DC | PRN

## 2023-09-24 NOTE — Progress Notes (Signed)

## 2023-10-07 ENCOUNTER — Telehealth: Admitting: Nurse Practitioner

## 2023-10-07 DIAGNOSIS — J329 Chronic sinusitis, unspecified: Secondary | ICD-10-CM

## 2023-10-07 DIAGNOSIS — B9689 Other specified bacterial agents as the cause of diseases classified elsewhere: Secondary | ICD-10-CM

## 2023-10-08 MED ORDER — AMOXICILLIN-POT CLAVULANATE 875-125 MG PO TABS: 1.0000 | ORAL_TABLET | Freq: Two times a day (BID) | ORAL | 0 refills | Status: AC

## 2023-10-08 NOTE — Progress Notes (Signed)
 I have spent 5 minutes in review of e-visit questionnaire, review and updating patient chart, medical decision making and response to patient.   Claiborne Rigg, NP

## 2023-10-08 NOTE — Progress Notes (Signed)

## 2023-10-16 ENCOUNTER — Other Ambulatory Visit: Payer: Self-pay | Admitting: Internal Medicine

## 2023-10-16 DIAGNOSIS — I152 Hypertension secondary to endocrine disorders: Secondary | ICD-10-CM

## 2023-10-17 ENCOUNTER — Encounter: Payer: Self-pay | Admitting: Internal Medicine

## 2023-10-17 ENCOUNTER — Encounter: Payer: Self-pay | Admitting: Pharmacist

## 2023-10-17 ENCOUNTER — Ambulatory Visit: Attending: Internal Medicine | Admitting: Pharmacist

## 2023-10-17 ENCOUNTER — Ambulatory Visit: Payer: 59 | Attending: Internal Medicine | Admitting: Internal Medicine

## 2023-10-17 DIAGNOSIS — Z7985 Long-term (current) use of injectable non-insulin antidiabetic drugs: Secondary | ICD-10-CM

## 2023-10-17 DIAGNOSIS — Z23 Encounter for immunization: Secondary | ICD-10-CM | POA: Diagnosis not present

## 2023-10-17 DIAGNOSIS — E119 Type 2 diabetes mellitus without complications: Secondary | ICD-10-CM

## 2023-10-17 DIAGNOSIS — E785 Hyperlipidemia, unspecified: Secondary | ICD-10-CM | POA: Diagnosis not present

## 2023-10-17 DIAGNOSIS — E1159 Type 2 diabetes mellitus with other circulatory complications: Secondary | ICD-10-CM

## 2023-10-17 DIAGNOSIS — Z7984 Long term (current) use of oral hypoglycemic drugs: Secondary | ICD-10-CM

## 2023-10-17 DIAGNOSIS — R000 Tachycardia, unspecified: Secondary | ICD-10-CM

## 2023-10-17 DIAGNOSIS — Z6841 Body Mass Index (BMI) 40.0 and over, adult: Secondary | ICD-10-CM

## 2023-10-17 DIAGNOSIS — I10 Essential (primary) hypertension: Secondary | ICD-10-CM

## 2023-10-17 DIAGNOSIS — E1169 Type 2 diabetes mellitus with other specified complication: Secondary | ICD-10-CM

## 2023-10-17 DIAGNOSIS — E201 Pseudohypoparathyroidism: Secondary | ICD-10-CM

## 2023-10-17 DIAGNOSIS — Z79899 Other long term (current) drug therapy: Secondary | ICD-10-CM

## 2023-10-17 LAB — POCT GLYCOSYLATED HEMOGLOBIN (HGB A1C): HbA1c, POC (controlled diabetic range): 7.6 % — AB (ref 0.0–7.0)

## 2023-10-17 LAB — GLUCOSE, POCT (MANUAL RESULT ENTRY): POC Glucose: 176 mg/dL — AB (ref 70–99)

## 2023-10-17 MED ORDER — LISINOPRIL 40 MG PO TABS: 40.0000 mg | ORAL_TABLET | Freq: Every day | ORAL | 3 refills | Status: AC

## 2023-10-17 MED ORDER — METFORMIN HCL 500 MG PO TABS: ORAL_TABLET | ORAL | 0 refills | Status: DC

## 2023-10-17 MED ORDER — TIRZEPATIDE 2.5 MG/0.5ML ~~LOC~~ SOAJ: 2.5000 mg | SUBCUTANEOUS | 0 refills | Status: AC

## 2023-10-17 MED ORDER — AMLODIPINE BESYLATE 10 MG PO TABS: 10.0000 mg | ORAL_TABLET | Freq: Every day | ORAL | 3 refills | Status: AC

## 2023-10-17 MED ORDER — ATORVASTATIN CALCIUM 10 MG PO TABS: 10.0000 mg | ORAL_TABLET | Freq: Every day | ORAL | 3 refills | Status: AC

## 2023-10-17 NOTE — Progress Notes (Signed)
 Patient was educated on the use of the [facility] pen. Reviewed necessary supplies and operation of the pen. Also reviewed goal blood glucose levels. Patient was able to demonstrate use. All questions and concerns were addressed.  Time spent counseling: 15 minutes  Follow-up: prn   Butch Penny, PharmD, [place], CPP Clinical Pharmacist [facility] [phone]

## 2023-10-17 NOTE — Progress Notes (Signed)
 Patient ID: Peter Hahn, male    DOB: 1980/08/13  MRN: [id number]  CC: Diabetes (DM & HTN f/u. Med refills. /No questions / concerns/Discuss pneumonia vax )   Subjective: Peter Hahn is a 43 y.o. male who presents for chronic ds management. His concerns today include:  Pt with hx of DM type 2, HTN, HL, morbid obesity, gen anxiety, allergic rhinitis, pseudo-hypoparathyroid and eczema.     Patient last saw me May 2023. States he had a lot going on. Had a son since last visit. He is a Social research officer, government at [employer]. Also helps his mom who is legally blind.  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  He was continued on Rocaltrol.  He continues to take calcium citrate 500 mg 3 times a day.  Has follow-up visit next month.  HTN: Should be on amlodipine 10 mg daily and lisinopril 40 mg daily.  Taking consistently and took already this a.m Checks blood pressure once a day.  Gives range 130-135/72-80 Tries to limit salt in the foods. No chest pains or shortness of breath.  Noted to be tachycardic today when pulse was taken by my CMA.  Again he attributes this to nervousness when he comes to physician's office. Was up since 5 a.m thinking about this appt.  No palpitations.TSH May 2024 was nl. BP device also checks pulse but he has not paid attention to it.  DM: Results for orders placed or performed in visit on 10/17/23  POCT glycosylated hemoglobin (Hb A1C)   Collection Time: 10/17/23  8:50 AM  Result Value Ref Range   Hemoglobin A1C     HbA1c POC (<> result, manual entry)     HbA1c, POC (prediabetic range)     HbA1c, POC (controlled diabetic range) 7.6 (A) 0.0 - 7.0 %  POCT glucose (manual entry)   Collection Time: 10/17/23  8:51 AM  Result Value Ref Range   POC Glucose 176 (A) 70 - 99 mg/dl  On last visit we had started him on Trulicity 0.75 mg once a week and plan was to stop Metformin once on Trulicity for 2 wks.  Pharmacy did not have Trulicity at that time and  he did not called back to check on it.  He stopped Metformin despite not getting the Trulicity. -Feels he does not eat a lot but wife tells him he snacks a lot. Drinks mainly diet drinks; needs to increase water intake. Would be interest in seeing a nutritionist.  -not much exercise since winter.  Now that weather changing, plans to do more. At work he tries to park further away from building  HL: Should be on atorvastatin 10 mg daily; reports he is not taking it.  RF last yr for 90 day with 3 additional RF.  Told by pharmacy that we declined RF.  Off x 6 mths  HM:  Due for pneumonia vaccine.  Did not get flu vaccine.  Just got over bronchitis.    Patient Active Problem List   Diagnosis Date Noted   Type 2 diabetes mellitus with morbid obesity (HCC) 12/01/2021   Tachycardia 12/01/2021   Eczema 08/15/2015   Dyshidrotic eczema 06/23/2015   Generalized anxiety disorder 10/04/2014   HTN (hypertension) 08/26/2014   Morbid obesity (HCC) 08/26/2014   Allergic rhinitis 08/26/2014   Hypoparathyroidism (HCC)      Current Outpatient Medications on File Prior to Visit  Medication Sig Dispense Refill   albuterol (VENTOLIN HFA) 108 (90 Base) MCG/ACT inhaler Inhale 1-2  puffs into the lungs every 6 (six) hours as needed for wheezing or shortness of breath. 6.7 g 0   calcitRIOL (ROCALTROL) 0.25 MCG capsule TAKE 1 CAPSULE BY MOUTH EVERY DAY 90 capsule 1   Calcium Citrate (CITRACAL PO) Take 400 mg by mouth 4 (four) times daily.     fluticasone (FLONASE) 50 MCG/ACT nasal spray Place 2 sprays into both nostrils daily. 16 g 0   glucose blood test strip Use as instructed 100 each 12   Lancets MISC UAD 100 each 3   Blood Glucose Monitoring Suppl (CONTOUR NEXT MONITOR) w/Device KIT UAD (Patient not taking: Reported on 10/17/2023) 1 kit 0   triamcinolone cream (KENALOG) 0.1 % APPLY 1 APPLICATION. TOPICALLY 2 (TWO) TIMES DAILY. (Patient not taking: Reported on 10/17/2023) 30 g 0   No current  facility-administered medications on file prior to visit.    Allergies  Allergen Reactions   Diltiazem     Body aches, chills   Pollen Extract     Sneezing, headaches    Social History   Socioeconomic History   Marital status: Married    Spouse name: Not on file   Number of children: 0   Years of education: Not on file   Highest education level: Not on file  Occupational History   Not on file  Tobacco Use   Smoking status: Never   Smokeless tobacco: Never  Substance and Sexual Activity   Alcohol use: No    Alcohol/week: 0.0 standard drinks of alcohol   Drug use: No    Frequency: 1.0 times per week   Sexual activity: Yes    Partners: Female    Birth control/protection: Condom  Other Topics Concern   Not on file  Social History Narrative   Not on file   Social Drivers of Health   Financial Resource Strain: Low Risk  (10/17/2023)   Overall Financial Resource Strain (CARDIA)    Difficulty of Paying Living Expenses: Not hard at all  Food Insecurity: No Food Insecurity (10/17/2023)   Hunger Vital Sign    Worried About Running Out of Food in the Last Year: Never true    Ran Out of Food in the Last Year: Never true  Transportation Needs: No Transportation Needs (10/17/2023)   PRAPARE - Administrator, Civil Service (Medical): No    Lack of Transportation (Non-Medical): No  Physical Activity: Inactive (10/17/2023)   Exercise Vital Sign    Days of Exercise per Week: 0 days    Minutes of Exercise per Session: 0 min  Stress: No Stress Concern Present (10/17/2023)   [employer] of Occupational Health - Occupational Stress Questionnaire    Feeling of Stress : Not at all  Social Connections: Socially Integrated (10/17/2023)   Social Connection and Isolation Panel [NHANES]    Frequency of Communication with Friends and Family: More than three times a week    Frequency of Social Gatherings with Friends and Family: Twice a week    Attends Religious Services: More  than 4 times per year    Active Member of [employer] or Organizations: No    Attends Banker Meetings: 1 to 4 times per year    Marital Status: Married  Catering manager Violence: Not At Risk (10/17/2023)   Humiliation, Afraid, Rape, and Kick questionnaire    Fear of Current or Ex-Partner: No    Emotionally Abused: No    Physically Abused: No    Sexually Abused: No    Family  History  Problem Relation Age of Onset   Diabetes Mother    Diabetes Father    Hypertension Father    Hypertension Paternal Grandmother     No past surgical history on file.  ROS: Review of Systems Negative except as stated above  PHYSICAL EXAM: BP 113/83 (BP Location: Left Arm, Patient Position: Sitting, Cuff Size: Large)   Pulse (!) 113   Temp 98.1 F (36.7 C) (Oral)   Ht 5\' 10"  (1.778 m)   Wt (!) 366 lb (166 kg)   SpO2 98%   BMI 52.52 kg/m   Wt Readings from Last 3 Encounters:  10/17/23 (!) 366 lb (166 kg)  05/05/23 (!) 359 lb 3.2 oz (162.9 kg)  12/09/22 (!) 357 lb 12.8 oz (162.3 kg)    Physical Exam   General appearance - alert, well appearing, morbidly obese middle-age African-American male and in no distress Mental status - alert, oriented to person, place, and time Chest - clear to auscultation, no wheezes, rales or rhonchi, symmetric air entry Heart -mild tachycardia but regular.  No gallops or murmurs heard Extremities - peripheral pulses normal, no pedal edema, no clubbing or cyanosis Diabetic Foot Exam - Simple   Simple Foot Form Diabetic Foot exam was performed with the following findings: Yes 10/17/2023  9:28 AM  Visual Inspection See comments: Yes Sensation Testing Intact to touch and monofilament testing bilaterally: Yes Pulse Check Posterior Tibialis and Dorsalis pulse intact bilaterally: Yes Comments Feet on the dorsal or plantar surface dry.  Toenails slightly overgrown.        Latest Ref Rng & Units 12/09/2022    8:49 AM 07/15/2021    8:21 AM 05/14/2021     2:14 PM  CMP  Glucose 70 - 99 mg/dL 098  119  147   BUN 6 - 23 mg/dL 14  17  14    Creatinine 0.40 - 1.50 mg/dL 8.29  5.62  1.30   Sodium 135 - 145 mEq/L 139  141  142   Potassium 3.5 - 5.1 mEq/L 3.9  4.2  4.1   Chloride 96 - 112 mEq/L 101  101  102   CO2 19 - 32 mEq/L 28  30  24    Calcium 8.4 - 10.5 mg/dL 8.6 - 86.5 mg/dL 8.4    8.2  9.0    9.2  8.8   Total Protein 6.0 - 8.5 g/dL   7.1   Total Bilirubin 0.0 - 1.2 mg/dL   0.2   Alkaline Phos 44 - 121 IU/L   91   AST 0 - 40 IU/L   15   ALT 0 - 44 IU/L   20    Lipid Panel     Component Value Date/Time   CHOL 157 05/14/2021 1414   TRIG 107 05/14/2021 1414   HDL 38 (L) 05/14/2021 1414   CHOLHDL 4.1 05/14/2021 1414   CHOLHDL 5.9 08/26/2014 1459   VLDL 26 08/26/2014 1459   LDLCALC 99 05/14/2021 1414    CBC    Component Value Date/Time   WBC 9.6 05/14/2021 1414   WBC 13.1 (H) 10/15/2016 2016   RBC 4.50 05/14/2021 1414   RBC 4.31 10/15/2016 2016   HGB 13.8 05/14/2021 1414   HCT 40.9 05/14/2021 1414   PLT 309 05/14/2021 1414   MCV 91 05/14/2021 1414   MCH 30.7 05/14/2021 1414   MCH 29.9 10/15/2016 2016   MCHC 33.7 05/14/2021 1414   MCHC 32.7 10/15/2016 2016   RDW 13.8 05/14/2021 1414  LYMPHSABS 1,887 09/30/2016 1411   MONOABS 444 09/30/2016 1411   EOSABS 111 09/30/2016 1411   BASOSABS 0 09/30/2016 1411    ASSESSMENT AND PLAN: 1. Type 2 diabetes mellitus with morbid obesity (HCC) (Primary) A1c not at goal.  Weight has increased.  Encourage healthy eating habits.  Referral submitted to nutritionist. Discussed trying him with one of the GLP 1 agents to help with both DM control and wgh loss.  Looks like his insurance covers [employer] and [employer].  I recommend that we try the former as it brings about even greater weight loss than Trulicity or Ozempic.  We went over how the medication works and potential side effects.  No history of thyroid cancer or pancreatitis.  Advised patient that the medication can cause nausea,  vomiting, pancreatitis, bowel blockage, diarrhea/constipation.  If he develops any vomiting, abdominal pain, severe diarrhea or constipation, he should stop the medicine and come in.  We had the clinical pharmacist meet with him today to teach administration.  We will start him on the 2 mg once a week. Restart Metformin 500 mg daily for now as well. -Will have him follow-up with the clinical pharmacist in 1 month to see how he is doing on the [facility] and for medication titration. - POCT glycosylated hemoglobin (Hb A1C) - POCT glucose (manual entry) - Microalbumin / creatinine urine ratio - CBC - Comprehensive metabolic panel - Lipid panel - Amb ref to Medical Nutrition Therapy-MNT - tirzepatide (MOUNJARO) 2.5 MG/0.5ML Pen; Inject 2.5 mg into the skin once a week.  Dispense: 2 mL; Refill: 0 - metFORMIN (GLUCOPHAGE) 500 MG tablet; TAKE 1 TABLET BY MOUTH EVERY DAY WITH BREAKFAST  Dispense: 180 tablet; Refill: 0  2. Diabetes mellitus treated with oral medication (HCC) 3. Long-term (current) use of injectable non-insulin antidiabetic drugs See #1 above  4. Hypertension associated with diabetes (HCC) Close to goal. Continue lisinopril 40 mg daily and Norvasc 10 mg daily - lisinopril (ZESTRIL) 40 MG tablet; Take 1 tablet (40 mg total) by mouth daily.  Dispense: 90 tablet; Refill: 3 - amLODipine (NORVASC) 10 MG tablet; Take 1 tablet (10 mg total) by mouth daily.  Dispense: 90 tablet; Refill: 3  5. Hyperlipidemia associated with type 2 diabetes mellitus (HCC) Will have him restart atorvastatin 10 mg daily to help decrease cardiovascular risks. - atorvastatin (LIPITOR) 10 MG tablet; Take 1 tablet (10 mg total) by mouth daily. E11.69  Dispense: 90 tablet; Refill: 3  6. Tachycardia Patient attributes this to being very nervous and anxious when he comes in for appointments.  He declines referral to cardiology  7. 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 On Rocalcitrol  8. Need for  influenza vaccination - Flu vaccine trivalent PF, 6mos and older(Flulaval,Afluria,Fluarix,Fluzone)  9. Need for vaccination against Streptococcus pneumoniae - Pneumococcal conjugate vaccine 20-valent     Patient was given the opportunity to ask questions.  Patient verbalized understanding of the plan and was able to repeat key elements of the plan.   This documentation was completed using Paediatric nurse.  Any transcriptional errors are unintentional.  Orders Placed This Encounter  Procedures   Pneumococcal conjugate vaccine 20-valent   Flu vaccine trivalent PF, 6mos and older(Flulaval,Afluria,Fluarix,Fluzone)   Microalbumin / creatinine urine ratio   CBC   Comprehensive metabolic panel   Lipid panel   Amb ref to Medical Nutrition Therapy-MNT   POCT glycosylated hemoglobin (Hb A1C)   POCT glucose (manual entry)     Requested Prescriptions   Signed Prescriptions Disp Refills  tirzepatide (MOUNJARO) 2.5 MG/0.5ML Pen 2 mL 0    Sig: Inject 2.5 mg into the skin once a week.   metFORMIN (GLUCOPHAGE) 500 MG tablet 180 tablet 0    Sig: TAKE 1 TABLET BY MOUTH EVERY DAY WITH BREAKFAST   lisinopril (ZESTRIL) 40 MG tablet 90 tablet 3    Sig: Take 1 tablet (40 mg total) by mouth daily.   amLODipine (NORVASC) 10 MG tablet 90 tablet 3    Sig: Take 1 tablet (10 mg total) by mouth daily.   atorvastatin (LIPITOR) 10 MG tablet 90 tablet 3    Sig: Take 1 tablet (10 mg total) by mouth daily. E11.69    Return in about 4 months (around 02/16/2024) for 4 weeks with clinical pharmacist for DM.  Jonah Blue, MD, FACP

## 2023-10-17 NOTE — Patient Instructions (Signed)
 Start Mounjaro 2.5 mg once a week.  After being on this medication for 1 month, if you are tolerating it, her clinical pharmacist will increase the dose to 5 mg once a week. Please stop the medication and come on in if you develop any abdominal pain, vomiting, severe diarrhea or constipation while on this medication.  You will be called with appointment to see the nutritionist.  Please restart the atorvastatin 10 mg daily for cholesterol.  This helps to decrease risk of heart attack and stroke in patients with diabetes.  Continue amlodipine and lisinopril for blood pressure.

## 2023-10-18 ENCOUNTER — Encounter: Payer: Self-pay | Admitting: Internal Medicine

## 2023-10-18 LAB — CBC
Hematocrit: 42.2 % (ref 37.5–51.0)
Hemoglobin: 14 g/dL (ref 13.0–17.7)
MCH: 30.4 pg (ref 26.6–33.0)
MCHC: 33.2 g/dL (ref 31.5–35.7)
MCV: 92 fL (ref 79–97)
Platelets: 302 10*3/uL (ref 150–450)
RBC: 4.6 x10E6/uL (ref 4.14–5.80)
RDW: 13.1 % (ref 11.6–15.4)
WBC: 9.7 10*3/uL (ref 3.4–10.8)

## 2023-10-18 LAB — LIPID PANEL
Chol/HDL Ratio: 5.2 ratio — ABNORMAL HIGH (ref 0.0–5.0)
Cholesterol, Total: 204 mg/dL — ABNORMAL HIGH (ref 100–199)
HDL: 39 mg/dL — ABNORMAL LOW (ref >39)
LDL Chol Calc (NIH): 140 mg/dL — ABNORMAL HIGH (ref 0–99)
Triglycerides: 137 mg/dL (ref 0–149)
VLDL Cholesterol Cal: 25 mg/dL (ref 5–40)

## 2023-10-18 LAB — COMPREHENSIVE METABOLIC PANEL
ALT: 24 IU/L (ref 0–44)
AST: 19 IU/L (ref 0–40)
Albumin: 3.9 g/dL — ABNORMAL LOW (ref 4.1–5.1)
Alkaline Phosphatase: 83 IU/L (ref 44–121)
BUN/Creatinine Ratio: 11 (ref 9–20)
BUN: 12 mg/dL (ref 6–24)
Bilirubin Total: 0.2 mg/dL (ref 0.0–1.2)
CO2: 25 mmol/L (ref 20–29)
Calcium: 8.1 mg/dL — ABNORMAL LOW (ref 8.7–10.2)
Chloride: 101 mmol/L (ref 96–106)
Creatinine, Ser: 1.07 mg/dL (ref 0.76–1.27)
Globulin, Total: 3.4 g/dL (ref 1.5–4.5)
Glucose: 138 mg/dL — ABNORMAL HIGH (ref 70–99)
Potassium: 4.1 mmol/L (ref 3.5–5.2)
Sodium: 143 mmol/L (ref 134–144)
Total Protein: 7.3 g/dL (ref 6.0–8.5)
eGFR: 88 mL/min/{1.73_m2} (ref >59)

## 2023-10-20 ENCOUNTER — Other Ambulatory Visit: Payer: Self-pay

## 2023-10-20 DIAGNOSIS — E201 Pseudohypoparathyroidism: Secondary | ICD-10-CM

## 2023-10-27 ENCOUNTER — Other Ambulatory Visit: Payer: BC Managed Care – PPO

## 2023-11-03 ENCOUNTER — Ambulatory Visit: Payer: BC Managed Care – PPO | Admitting: "Endocrinology

## 2023-11-21 ENCOUNTER — Ambulatory Visit: Admitting: Pharmacist

## 2023-11-21 NOTE — Progress Notes (Deleted)
 S:     No chief complaint on file.  43 y.o. male who presents for diabetes evaluation, education, and management. Patient arrives in *** good spirits and presents without *** any assistance. ***Patient is accompanied by ***.   PMH is significant for T2DM, HTN, HLD, obesity, eczema  Patient was referred and last seen by Primary Care Provider, Dr. Lincoln Renshaw, on 10/17/23. At this visit, patient was not taking any medications for DM. A1C was 7.6%. He was previously prescribed Trulicity , but did not successfully pick it up from the pharmacy. He was initiated on Mounjaro 2.5 mg weekly, and educated on the pen device by pharmacy. He was also restarted on metformin  500 mg daily.  Today, ***  Patient reports Diabetes was diagnosed in ***.   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 Tobacco: Alcohol:   Current diabetes medications include: Mounjaro 2.5 mg weekly, metformin  500 mg daily Current hypertension medications include: amlodipine  10 mg daily, lisinopril  40 mg daily Current hyperlipidemia medications include: atorvastatin  10 mg daily  Patient reports adherence to taking all medications as prescribed.  *** Patient denies adherence with medications, reports missing *** medications *** times per week, on average.  Do you feel that your medications are working for you? {YES NO:22349} Have you been experiencing any side effects to the medications prescribed? {YES NO:22349} Do you have any problems obtaining medications due to transportation or finances? {YES [id number] Insurance coverage: ***  Patient {Actions; denies-reports:120008} hypoglycemic events.  Reported home fasting blood sugars: ***  Reported 2 hour post-meal/random blood sugars: ***.  Patient {Actions; denies-reports:120008} nocturia (nighttime urination).  Patient {Actions; denies-reports:120008} neuropathy (nerve pain). Patient {Actions; denies-reports:120008} visual changes. Patient {Actions;  denies-reports:120008} self foot exams.   Patient reported dietary habits: Eats *** meals/day Breakfast: *** Lunch: *** Dinner: *** Snacks: *** Drinks: ***  Within the past 12 months, did you worry whether your food would run out before you got money to buy more? {YES NO:22349} Within the past 12 months, did the food you bought run out, and you didn't have money to get more? {YES NO:22349} PHQ-9 Score: ***  Patient-reported exercise habits: ***   O:   ROS  Physical Exam  7 day average blood glucose: ***  Libre3 CGM Download today *** % Time CGM is active: ***% Average Glucose: *** mg/dL Glucose Management Indicator: ***  Glucose Variability: ***% (goal <36%) Time in Goal:  - Time in range 70-180: ***% - Time above range: ***% - Time below range: ***% Observed patterns:   Lab Results  Component Value Date   HGBA1C 7.6 (A) 10/17/2023   There were no vitals filed for this visit.  Lipid Panel     Component Value Date/Time   CHOL 204 (H) 10/17/2023 0941   TRIG 137 10/17/2023 0941   HDL 39 (L) 10/17/2023 0941   CHOLHDL 5.2 (H) 10/17/2023 0941   CHOLHDL 5.9 08/26/2014 1459   VLDL 26 08/26/2014 1459   LDLCALC 140 (H) 10/17/2023 0941    Clinical Atherosclerotic Cardiovascular Disease (ASCVD): {YES/NO:21197} The ASCVD Risk score (Arnett DK, et al., 2019) failed to calculate for the following reasons:   Unable to determine if patient is Non-Hispanic African American   Patient is participating in a Managed Medicaid Plan:  {MM YES/NO:27447::"Yes"}   A/P: Diabetes longstanding *** currently ***. Patient is *** able to verbalize appropriate hypoglycemia management plan. Medication adherence appears ***. Control is suboptimal due to ***. -{Meds adjust:18428} basal insulin *** Lantus/Basaglar/Semglee (insulin glargine) *** Horace Lye (insulin  degludec) from *** units to *** units daily in the morning. Patient will continue to titrate 1 unit every *** days if fasting blood  sugar > 100mg /dl until fasting blood sugars reach goal or next visit.  -{Meds adjust:18428} rapid insulin *** Novolog (insulin aspart) *** Humalog (insulin lispro) from *** to ***.  -{Meds adjust:18428} GLP-1 *** Trulicity  (dulaglutide ) *** Ozempic (semaglutide) *** Mounjaro (tirzepatide) from *** mg to *** mg .  -{Meds adjust:18428} SGLT2-I *** Farxiga (dapagliflozin) *** Jardiance (empagliflozin) 10 mg. Counseled on sick day rules. -{Meds adjust:18428} metformin  ***.  -Patient educated on purpose, proper use, and potential adverse effects of ***.  -Extensively discussed pathophysiology of diabetes, recommended lifestyle interventions, dietary effects on blood sugar control.  -Counseled on s/sx of and management of hypoglycemia.  -Next A1c anticipated ***.   ASCVD risk - primary ***secondary prevention in patient with diabetes. Last LDL is *** not at goal of <62 *** mg/dL. ASCVD risk factors include *** and 10-year ASCVD risk score of ***. {Desc; low/moderate/high:110033} intensity statin indicated.  -{Meds adjust:18428} ***statin *** mg.   Hypertension longstanding *** currently ***. Blood pressure goal of <130/80 *** mmHg. Medication adherence ***. Blood pressure control is suboptimal due to ***. -{Meds adjust:18428} *** mg.  Written patient instructions provided. Patient verbalized understanding of treatment plan.  Total time in face to face counseling *** minutes.    Follow-up:  Pharmacist *** PCP clinic visit in *** Patient seen with ***

## 2023-12-06 ENCOUNTER — Ambulatory Visit: Admitting: Dietician

## 2023-12-27 ENCOUNTER — Ambulatory Visit: Admitting: "Endocrinology

## 2023-12-30 ENCOUNTER — Ambulatory Visit: Admitting: "Endocrinology

## 2023-12-30 ENCOUNTER — Encounter: Payer: Self-pay | Admitting: "Endocrinology

## 2023-12-30 VITALS — BP 120/80 | HR 110 | Ht 70.0 in | Wt 365.0 lb

## 2023-12-30 DIAGNOSIS — E201 Pseudohypoparathyroidism: Secondary | ICD-10-CM | POA: Diagnosis not present

## 2023-12-30 MED ORDER — CALCITRIOL 0.25 MCG PO CAPS: ORAL_CAPSULE | ORAL | 1 refills | Status: DC

## 2023-12-30 NOTE — Progress Notes (Signed)
 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   I have reviewed current medications, nurse's notes, allergies, vital signs, past medical and surgical history, family medical history, and social history for this encounter. Counseled patient on symptoms, examination findings, lab findings, imaging results, treatment decisions and monitoring and prognosis. The patient understood the recommendations and agrees with the treatment plan. All questions regarding treatment plan were fully answered.  Peter Newcomer, MD  01/10/24   History of Present Illness HPI  Peter Hahn is a 43 y.o. year old male who presents for follow up of hypoparathyroidism. Peter Hahn was first diagnosed of pseudohypoparathyroidism at age 10 with seizures.  Feels well with no new complaints  Reports keeping a check of blood pressure at home and reports having "long standing white-coat syndrome", with home readings  <140/90.  Prior history: Pseudohypoparathyroidism diagnosed in 1991; he took rocaltrol  for approx 9 years, then changed to Vit D3/Ca++ combined pill, but he does not know the dosage; he was then  changed to rocaltrol ).   Current regimen: Calcitriol  0.25 mcg qd Calcium  citrate 600 mg tid   Did not tolerate calcium  carbonate due to side effects   Current symptoms: No numbness/tingling/head ache/abdominal pain/nausea/vomiting/seizures/falls fractures  Recovered from constipation   Physical Exam  BP 120/80   Pulse (!) 110   Ht 5\' 10"  (1.778 m)   Wt (!) 365 lb (165.6 kg)   SpO2 98%   BMI 52.37 kg/m    Constitutional: well developed, well nourished Head: normocephalic, atraumatic Eyes: sclera anicteric, no redness Neck: supple Lungs: normal respiratory effort Neurology: alert and oriented Skin: dry, no appreciable rashes Musculoskeletal: no appreciable defects Psychiatric: normal mood and affect   Current Medications Patient's Medications  New Prescriptions   No medications on file  Previous Medications   ALBUTEROL  (VENTOLIN  HFA) 108 (90 BASE) MCG/ACT INHALER    Inhale 1-2 puffs into the lungs every 6 (six) hours as needed for wheezing or shortness of breath.   AMLODIPINE  (NORVASC ) 10 MG TABLET    Take 1 tablet (10 mg total) by mouth daily.   ATORVASTATIN  (LIPITOR) 10 MG TABLET    Take 1 tablet (10 mg total) by mouth daily. E11.69   BLOOD GLUCOSE MONITORING SUPPL (CONTOUR NEXT MONITOR) W/DEVICE KIT    UAD   CALCIUM  CITRATE (CITRACAL PO)    Take 400 mg by mouth 4 (four) times daily.   FLUTICASONE  (FLONASE ) 50 MCG/ACT NASAL SPRAY    Place 2 sprays into both nostrils daily.   GLUCOSE BLOOD TEST STRIP    Use as instructed   LANCETS MISC  UAD   LISINOPRIL  (ZESTRIL ) 40 MG TABLET    Take 1 tablet (40 mg total) by mouth daily.   METFORMIN  (GLUCOPHAGE ) 500 MG TABLET    TAKE 1 TABLET BY MOUTH EVERY DAY WITH BREAKFAST   TIRZEPATIDE (MOUNJARO) 2.5 MG/0.5ML PEN    Inject 2.5 mg into the skin once a week.   TRIAMCINOLONE  CREAM (KENALOG ) 0.1 %    APPLY 1 APPLICATION. TOPICALLY 2 (TWO) TIMES DAILY.  Modified Medications   Modified Medication Previous Medication   CALCITRIOL   (ROCALTROL ) 0.25 MCG CAPSULE calcitRIOL  (ROCALTROL ) 0.25 MCG capsule      TAKE 1 CAPSULE BY MOUTH EVERY DAY    TAKE 1 CAPSULE BY MOUTH EVERY DAY  Discontinued Medications   No medications on file    Allergies Allergies  Allergen Reactions   Diltiazem      Body aches, chills   Pollen Extract     Sneezing, headaches    Past Medical History Past Medical History:  Diagnosis Date   Allergy     since birth    Hypertension 2006   first told his BP was high, never treated    Obesity    Pseudohypoparathyroidism     Past Surgical History History reviewed. No pertinent surgical history.  Family History family history includes Diabetes in his father and mother; Hypertension in his father and paternal grandmother.  Social History Social History   Socioeconomic History   Marital status: Married    Spouse name: Not on file   Number of children: 0   Years of education: Not on file   Highest education level: Not on file  Occupational History   Not on file  Tobacco Use   Smoking status: Never   Smokeless tobacco: Never  Substance and Sexual Activity   Alcohol use: No    Alcohol/week: 0.0 standard drinks of alcohol   Drug use: No    Frequency: 1.0 times per week   Sexual activity: Yes    Partners: Female    Birth control/protection: Condom  Other Topics Concern   Not on file  Social History Narrative   Not on file   Social Drivers of Health   Financial Resource Strain: Low Risk  (10/17/2023)   Overall Financial Resource Strain (CARDIA)    Difficulty of Paying Living Expenses: Not hard at all  Food Insecurity: No Food Insecurity (10/17/2023)   Hunger Vital Sign    Worried About Running Out of Food in the Last Year: Never true    Ran Out of Food in the Last Year: Never true  Transportation Needs: No Transportation Needs (10/17/2023)   PRAPARE - Administrator, Civil Service (Medical): No    Lack of Transportation (Non-Medical): No  Physical Activity: Inactive  (10/17/2023)   Exercise Vital Sign    Days of Exercise per Week: 0 days    Minutes of Exercise per Session: 0 min  Stress: No Stress Concern Present (10/17/2023)   [employer] of Occupational Health - Occupational Stress Questionnaire    Feeling of Stress : Not at all  Social Connections: Socially Integrated (10/17/2023)   Social Connection and Isolation Panel [NHANES]    Frequency of Communication with Friends and Family: More than three times a week    Frequency of Social Gatherings with Friends and Family: Twice a week    Attends Religious Services: More than 4 times per year    Active Member of [employer] or Organizations: No    Attends Banker Meetings: 1 to  4 times per year    Marital Status: Married  Catering manager Violence: Not At Risk (10/17/2023)   Humiliation, Afraid, Rape, and Kick questionnaire    Fear of Current or Ex-Partner: No    Emotionally Abused: No    Physically Abused: No    Sexually Abused: No    Lab Results  Component Value Date   CHOL 204 (H) 10/17/2023   Lab Results  Component Value Date   HDL 39 (L) 10/17/2023   Lab Results  Component Value Date   LDLCALC 140 (H) 10/17/2023   Lab Results  Component Value Date   TRIG 137 10/17/2023   Lab Results  Component Value Date   CHOLHDL 5.2 (H) 10/17/2023   Lab Results  Component Value Date   CREATININE 1.07 10/17/2023   Lab Results  Component Value Date   GFR 79.42 12/09/2022      Component Value Date/Time   NA 143 10/17/2023 0941   K 4.1 10/17/2023 0941   CL 101 10/17/2023 0941   CO2 25 10/17/2023 0941   GLUCOSE 138 (H) 10/17/2023 0941   GLUCOSE 117 (H) 12/09/2022 0849   BUN 12 10/17/2023 0941   CREATININE 1.07 10/17/2023 0941   CREATININE 0.99 09/30/2016 1411   CALCIUM  8.1 (L) 10/17/2023 0941   PROT 7.3 10/17/2023 0941   ALBUMIN 3.9 (L) 10/17/2023 0941   AST 19 10/17/2023 0941   ALT 24 10/17/2023 0941   ALKPHOS 83 10/17/2023 0941   BILITOT 0.2 10/17/2023 0941    GFRNONAA 86 07/09/2019 0846   GFRNONAA >89 08/26/2014 1459   GFRAA 100 07/09/2019 0846   GFRAA >89 08/26/2014 1459      Latest Ref Rng & Units 10/17/2023    9:41 AM 12/09/2022    8:49 AM 07/15/2021    8:21 AM  BMP  Glucose 70 - 99 mg/dL 409  811  914   BUN 6 - 24 mg/dL 12  14  17    Creatinine 0.76 - 1.27 mg/dL 7.82  9.56  2.13   BUN/Creat Ratio 9 - 20 11     Sodium 134 - 144 mmol/L 143  139  141   Potassium 3.5 - 5.2 mmol/L 4.1  3.9  4.2   Chloride 96 - 106 mmol/L 101  101  101   CO2 20 - 29 mmol/L 25  28  30    Calcium  8.7 - 10.2 mg/dL 8.1  8.4    8.2  9.0    9.2        Component Value Date/Time   WBC 9.7 10/17/2023 0941   WBC 13.1 (H) 10/15/2016 2016   RBC 4.60 10/17/2023 0941   RBC 4.31 10/15/2016 2016   HGB 14.0 10/17/2023 0941   HCT 42.2 10/17/2023 0941   PLT 302 10/17/2023 0941   MCV 92 10/17/2023 0941   MCH 30.4 10/17/2023 0941   MCH 29.9 10/15/2016 2016   MCHC 33.2 10/17/2023 0941   MCHC 32.7 10/15/2016 2016   RDW 13.1 10/17/2023 0941   LYMPHSABS 1,887 09/30/2016 1411   MONOABS 444 09/30/2016 1411   EOSABS 111 09/30/2016 1411   BASOSABS 0 09/30/2016 1411   Lab Results  Component Value Date   TSH 3.62 12/07/2022   TSH 3.87 07/15/2021   TSH 2.19 09/30/2016   FREET4 0.82 12/07/2022   FREET4 0.82 07/15/2021     Parts of this note may have been dictated using voice recognition software. There may be variances in spelling and vocabulary which are unintentional. Not all  errors are proofread. Please notify the [facility] if any discrepancies are noted or if the meaning of any statement is not clear.

## 2024-02-16 ENCOUNTER — Ambulatory Visit: Admitting: Internal Medicine

## 2024-03-04 ENCOUNTER — Telehealth: Admitting: Physician Assistant

## 2024-03-04 DIAGNOSIS — J40 Bronchitis, not specified as acute or chronic: Secondary | ICD-10-CM | POA: Diagnosis not present

## 2024-03-04 MED ORDER — BENZONATATE 100 MG PO CAPS: 100.0000 mg | ORAL_CAPSULE | Freq: Three times a day (TID) | ORAL | 0 refills | Status: DC | PRN

## 2024-03-04 MED ORDER — AZITHROMYCIN 250 MG PO TABS: ORAL_TABLET | ORAL | 0 refills | Status: AC

## 2024-03-04 NOTE — Progress Notes (Signed)
 E-Visit for Cough   We are sorry that you are not feeling well.  Here is how we plan to help!  Based on your presentation I believe you most likely have A cough due to bacteria.  When patients have a fever and a productive cough with a change in color or increased sputum production, we are concerned about bacterial bronchitis.  If left untreated it can progress to pneumonia.  If your symptoms do not improve with your treatment plan it is important that you contact your provider.   I have prescribed Azithromyin 250 mg: two tablets now and then one tablet daily for 4 additonal days    In addition you may use A prescription cough medication called Tessalon Perles 100mg . You may take 1-2 capsules every 8 hours as needed for your cough.    From your responses in the eVisit questionnaire you describe inflammation in the upper respiratory tract which is causing a significant cough.  This is commonly called Bronchitis and has four common causes:   Allergies Viral Infections Acid Reflux Bacterial Infection Allergies, viruses and acid reflux are treated by controlling symptoms or eliminating the cause. An example might be a cough caused by taking certain blood pressure medications. You stop the cough by changing the medication. Another example might be a cough caused by acid reflux. Controlling the reflux helps control the cough.  USE OF BRONCHODILATOR ("RESCUE") INHALERS: There is a risk from using your bronchodilator too frequently.  The risk is that over-reliance on a medication which only relaxes the muscles surrounding the breathing tubes can reduce the effectiveness of medications prescribed to reduce swelling and congestion of the tubes themselves.  Although you feel brief relief from the bronchodilator inhaler, your asthma may actually be worsening with the tubes becoming more swollen and filled with mucus.  This can delay other crucial treatments, such as oral steroid medications. If you need to  use a bronchodilator inhaler daily, several times per day, you should discuss this with your provider.  There are probably better treatments that could be used to keep your asthma under control.     HOME CARE Only take medications as instructed by your medical team. Complete the entire course of an antibiotic. Drink plenty of fluids and get plenty of rest. Avoid close contacts especially the very young and the elderly Cover your mouth if you cough or cough into your sleeve. Always remember to wash your hands A steam or ultrasonic humidifier can help congestion.   GET HELP RIGHT AWAY IF: You develop worsening fever. You become short of breath You cough up blood. Your symptoms persist after you have completed your treatment plan MAKE SURE YOU  Understand these instructions. Will watch your condition. Will get help right away if you are not doing well or get worse.    Thank you for choosing an e-visit.  Your e-visit answers were reviewed by a board certified advanced clinical practitioner to complete your personal care plan. Depending upon the condition, your plan could have included both over the counter or prescription medications.  Please review your pharmacy choice. Make sure the pharmacy is open so you can pick up prescription now. If there is a problem, you may contact your provider through [employer] and have the prescription routed to another pharmacy.  Your safety is important to [place]. If you have drug allergies check your prescription carefully.   For the next 24 hours you can use MyChart to ask questions about today's visit, request  a non-urgent call back, or ask for a work or school excuse. You will get an email in the next two days asking about your experience. I hope that your e-visit has been valuable and will speed your recovery.  I have spent 5 minutes in review of e-visit questionnaire, review and updating patient chart, medical decision making and response to  patient.   Tylene Fantasia Ward, PA-C

## 2024-03-27 ENCOUNTER — Other Ambulatory Visit

## 2024-03-30 ENCOUNTER — Other Ambulatory Visit

## 2024-03-30 ENCOUNTER — Telehealth: Payer: Self-pay | Admitting: Internal Medicine

## 2024-03-30 NOTE — Telephone Encounter (Signed)
 Called patient to confirm upcoming appointment 04/03/2024. Patient appointment has been successfully confirmed

## 2024-04-03 ENCOUNTER — Ambulatory Visit: Admitting: Internal Medicine

## 2024-04-03 ENCOUNTER — Ambulatory Visit: Admitting: "Endocrinology

## 2024-04-24 ENCOUNTER — Other Ambulatory Visit: Payer: Self-pay | Admitting: Internal Medicine

## 2024-04-27 ENCOUNTER — Telehealth: Admitting: Physician Assistant

## 2024-04-27 DIAGNOSIS — J069 Acute upper respiratory infection, unspecified: Secondary | ICD-10-CM | POA: Diagnosis not present

## 2024-04-27 MED ORDER — BENZONATATE 100 MG PO CAPS: 100.0000 mg | ORAL_CAPSULE | Freq: Three times a day (TID) | ORAL | 0 refills | Status: AC | PRN

## 2024-04-27 MED ORDER — FLUTICASONE PROPIONATE 50 MCG/ACT NA SUSP: 2.0000 | Freq: Every day | NASAL | 0 refills | Status: AC

## 2024-04-27 NOTE — Progress Notes (Signed)

## 2024-05-02 ENCOUNTER — Telehealth: Admitting: Physician Assistant

## 2024-05-02 DIAGNOSIS — B9689 Other specified bacterial agents as the cause of diseases classified elsewhere: Secondary | ICD-10-CM

## 2024-05-02 MED ORDER — AMOXICILLIN-POT CLAVULANATE 875-125 MG PO TABS: 1.0000 | ORAL_TABLET | Freq: Two times a day (BID) | ORAL | 0 refills | Status: AC

## 2024-05-02 NOTE — Progress Notes (Signed)
 E-Visit for Sinus Problems  We are sorry that you are not feeling well.  Here is how we plan to help!  Based on what you have shared with me it looks like you have sinusitis.  Sinusitis is inflammation and infection in the sinus cavities of the head.  Based on your presentation I believe you most likely have Acute Bacterial Sinusitis.  This is an infection caused by bacteria and is treated with antibiotics. I have prescribed Augmentin 875mg /125mg  one tablet twice daily with food, for 7 days. You may use an oral decongestant such as Mucinex D or if you have glaucoma or high blood pressure use plain Mucinex. Saline nasal spray help and can safely be used as often as needed for congestion.  If you develop worsening sinus pain, fever or notice severe headache and vision changes, or if symptoms are not better after completion of antibiotic, please schedule an appointment with a health care provider.    Sinus infections are not as easily transmitted as other respiratory infection, however we still recommend that you avoid close contact with loved ones, especially the very young and elderly.  Remember to wash your hands thoroughly throughout the day as this is the number one way to prevent the spread of infection!  Home Care: Only take medications as instructed by your medical team. Complete the entire course of an antibiotic. Do not take these medications with alcohol. A steam or ultrasonic humidifier can help congestion.  You can place a towel over your head and breathe in the steam from hot water coming from a faucet. Avoid close contacts especially the very young and the elderly. Cover your mouth when you cough or sneeze. Always remember to wash your hands.  Get Help Right Away If: You develop worsening fever or sinus pain. You develop a severe head ache or visual changes. Your symptoms persist after you have completed your treatment plan.  Make sure you Understand these instructions. Will  watch your condition. Will get help right away if you are not doing well or get worse.  Your e-visit answers were reviewed by a board certified advanced clinical practitioner to complete your personal care plan.  Depending on the condition, your plan could have included both over the counter or prescription medications.  If there is a problem please reply  once you have received a response from your provider.  Your safety is important to us .  If you have drug allergies check your prescription carefully.    You can use MyChart to ask questions about today's visit, request a non-urgent call back, or ask for a work or school excuse for 24 hours related to this e-Visit. If it has been greater than 24 hours you will need to follow up with your provider, or enter a new e-Visit to address those concerns.  You will get an e-mail in the next two days asking about your experience.  I hope that your e-visit has been valuable and will speed your recovery. Thank you for using e-visits.  I have spent 5 minutes in review of e-visit questionnaire, review and updating patient chart, medical decision making and response to patient.   Peter Velma Lunger, PA-C

## 2024-05-15 ENCOUNTER — Ambulatory Visit: Admitting: Internal Medicine

## 2024-05-25 ENCOUNTER — Other Ambulatory Visit: Payer: Self-pay | Admitting: Internal Medicine

## 2024-05-26 NOTE — Telephone Encounter (Signed)
 Requested medication (s) are due for refill today: Yes  Requested medication (s) are on the active medication list: Yes  Last refill:  04/24/24  Future visit scheduled: No  Notes to clinic:  Unable to refill per protocol, appointment needed.      Requested Prescriptions  Pending Prescriptions Disp Refills   metFORMIN  (GLUCOPHAGE ) 500 MG tablet [Pharmacy Med Name: METFORMIN  HCL 500 MG TABLET] 90 tablet 1    Sig: TAKE 1 TABLET BY MOUTH EVERY DAY WITH BREAKFAST     Endocrinology:  Diabetes - Biguanides Failed - 05/26/2024  9:37 PM      Failed - HBA1C is between 0 and 7.9 and within 180 days    HbA1c, POC (prediabetic range)  Date Value Ref Range Status  12/01/2021 6.3 5.7 - 6.4 % Final   HbA1c, POC (controlled diabetic range)  Date Value Ref Range Status  10/17/2023 7.6 (A) 0.0 - 7.0 % Final         Failed - B12 Level in normal range and within 720 days    No results found for: VITAMINB12       Failed - Valid encounter within last 6 months    Recent Outpatient Visits           7 months ago Type 2 diabetes mellitus with morbid obesity (HCC)   [facility] [place]. [facility] Fleeta Morris, [place] L, RPH-CPP   7 months ago Type 2 diabetes mellitus with morbid obesity [facility])   [facility] Of [place]. [facility] Vicci Barnie NOVAK, MD   2 years ago Encounter for medication review   [facility] [place]. [facility] Fleeta Morris, [place] L, RPH-CPP   2 years ago Type 2 diabetes mellitus with morbid obesity (HCC)   [facility] Of [place]. [facility] Vicci Barnie NOVAK, MD   3 years ago Essential hypertension    [facility] [place]. [facility] Vicci Barnie B, MD              Failed - CBC within normal limits and completed in the last 12 months    WBC  Date Value  Ref Range Status  10/17/2023 9.7 3.4 - 10.8 x10E3/uL Final  10/15/2016 13.1 (H) 4.0 - 10.5 K/uL Final   RBC  Date Value Ref Range Status  10/17/2023 4.60 4.14 - 5.80 x10E6/uL Final  10/15/2016 4.31 4.22 - 5.81 MIL/uL Final   Hemoglobin  Date Value Ref Range Status  10/17/2023 14.0 13.0 - 17.7 g/dL Final   Hematocrit  Date Value Ref Range Status  10/17/2023 42.2 37.5 - 51.0 % Final   MCHC  Date Value Ref Range Status  10/17/2023 33.2 31.5 - 35.7 g/dL Final  96/83/7981 67.2 30.0 - 36.0 g/dL Final   [facility]  Date Value Ref Range Status  10/17/2023 30.4 26.6 - 33.0 pg Final  10/15/2016 29.9 26.0 - 34.0 pg Final   MCV  Date Value Ref Range Status  10/17/2023 92 79 - 97 fL Final   No results found for: PLTCOUNTKUC, LABPLAT, POCPLA RDW  Date Value Ref Range Status  10/17/2023 13.1 11.6 - 15.4 % Final         Passed - Cr in normal range and within 360 days    Creat  Date  Value Ref Range Status  09/30/2016 0.99 0.60 - 1.35 mg/dL Final   Creatinine, Ser  Date Value Ref Range Status  10/17/2023 1.07 0.76 - 1.27 mg/dL Final   Creatinine, Urine  Date Value Ref Range Status  08/26/2014 143.5 mg/dL Final    Comment:    No reference range established.         Passed - eGFR in normal range and within 360 days    GFR, Est African American  Date Value Ref Range Status  08/26/2014 >89 mL/min Final   GFR calc Af Amer  Date Value Ref Range Status  07/09/2019 100 >59 mL/min/1.73 Final   GFR, Est Non African American  Date Value Ref Range Status  08/26/2014 >89 mL/min Final    Comment:      The estimated GFR is a calculation valid for adults (>=6 years old) that uses the CKD-EPI algorithm to adjust for age and sex. It is   not to be used for children, pregnant women, hospitalized patients,    patients on dialysis, or with rapidly changing kidney function. According to the NKDEP, eGFR >89 is normal, 60-89 shows mild impairment, 30-59 shows moderate impairment, 15-29  shows severe impairment and <15 is ESRD.      GFR calc non Af Amer  Date Value Ref Range Status  07/09/2019 86 >59 mL/min/1.73 Final   GFR  Date Value Ref Range Status  12/09/2022 79.42 >60.00 mL/min Final    Comment:    Calculated using the CKD-EPI Creatinine Equation (2021)   eGFR  Date Value Ref Range Status  10/17/2023 88 >59 mL/min/1.73 Final

## 2024-05-28 ENCOUNTER — Other Ambulatory Visit

## 2024-05-29 ENCOUNTER — Other Ambulatory Visit: Payer: Self-pay | Admitting: Internal Medicine

## 2024-05-29 ENCOUNTER — Other Ambulatory Visit

## 2024-05-30 ENCOUNTER — Other Ambulatory Visit

## 2024-05-31 ENCOUNTER — Ambulatory Visit: Admitting: "Endocrinology

## 2024-05-31 ENCOUNTER — Encounter: Payer: Self-pay | Admitting: "Endocrinology

## 2024-05-31 LAB — RENAL FUNCTION PANEL
Albumin: 3.8 g/dL (ref 3.6–5.1)
BUN: 11 mg/dL (ref 7–25)
CO2: 31 mmol/L (ref 20–32)
Calcium: 7 mg/dL — ABNORMAL LOW (ref 8.6–10.3)
Chloride: 99 mmol/L (ref 98–110)
Creat: 0.92 mg/dL (ref 0.60–1.29)
Glucose, Bld: 92 mg/dL (ref 65–99)
Phosphorus: 3.8 mg/dL (ref 2.5–4.5)
Potassium: 3.5 mmol/L (ref 3.5–5.3)
Sodium: 140 mmol/L (ref 135–146)

## 2024-05-31 LAB — MAGNESIUM: Magnesium: 1.9 mg/dL (ref 1.5–2.5)

## 2024-05-31 LAB — TSH+FREE T4: TSH W/REFLEX TO FT4: 2.9 m[IU]/L (ref 0.40–4.50)

## 2024-05-31 LAB — VITAMIN D 25 HYDROXY (VIT D DEFICIENCY, FRACTURES): Vit D, 25-Hydroxy: 35 ng/mL (ref 30–100)

## 2024-05-31 LAB — PTH, INTACT AND CALCIUM
Calcium: 7 mg/dL — ABNORMAL LOW (ref 8.6–10.3)
PTH: 139 pg/mL — ABNORMAL LOW (ref 8.6–77)

## 2024-07-06 ENCOUNTER — Telehealth (INDEPENDENT_AMBULATORY_CARE_PROVIDER_SITE_OTHER): Admitting: "Endocrinology

## 2024-07-06 ENCOUNTER — Encounter: Payer: Self-pay | Admitting: "Endocrinology

## 2024-07-06 VITALS — Wt 365.0 lb

## 2024-07-06 DIAGNOSIS — E201 Pseudohypoparathyroidism: Secondary | ICD-10-CM

## 2024-07-06 DIAGNOSIS — E211 Secondary hyperparathyroidism, not elsewhere classified: Secondary | ICD-10-CM | POA: Diagnosis not present

## 2024-07-06 NOTE — Progress Notes (Signed)
 The patient reports they are currently: Peter Hahn. I spent 5 minutes on the video with the patient on the date of service. I spent an additional 10 minutes on pre- and post-visit activities on the date of service.   The patient was physically located in [place]  or a state in which I am permitted to provide care. The patient and/or parent/guardian understood that s/he may incur co-pays and cost sharing, and agreed to the telemedicine visit. The visit was reasonable and appropriate under the circumstances given the patient's presentation at the time.  The patient and/or parent/guardian has been advised of the potential risks and limitations of this mode of treatment (including, but not limited to, the absence of in-person examination) and has agreed to be treated using telemedicine. The patient's/patient's family's questions regarding telemedicine have been answered.   The patient and/or parent/guardian has also been advised to contact their provider's office for worsening conditions, and seek emergency medical treatment and/or call 911 if the patient deems either necessary.    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  Pseudohypoparathyroidism (PHP) is due to end-organ resistance to PTH CTX is elevated in PHP reflecting skeletal response to elevated PTH Currently on Calcitriol  0.25 mcg qd and Calcium  citrate 600 mg tid OTC, goal is not normalize PTH and lower its secondary elevation impacting bones, to be monitored by 24 hr urine calcium , serum CTX and DXA  Currently asymptomatic Ca low - patient was non-compliant around 05/2024 due to  family issues; complaint now Continue current regimen Labs now and before next visit   Return in about 5 months (around 12/04/2024) for visit + labs before next visit, labs today.   I have reviewed current medications, nurse's notes, allergies, vital signs, past medical and surgical history, family medical history, and social history for this encounter. Counseled patient on symptoms, examination findings, lab findings, imaging results, treatment decisions and monitoring and prognosis. The patient understood the recommendations and agrees with the treatment plan. All questions regarding treatment plan were fully answered.  Peter Birmingham, MD  07/06/24   History of Present Illness HPI  Peter Hahn is a 43 y.o. year old male who presents for follow up of hypoparathyroidism. Peter Hahn was first diagnosed of pseudohypoparathyroidism at age 72 with seizures.  Feels well with no new complaints  Was not able to take medications due to his son being sick around 05/2024  Reports keeping a check of blood pressure at home and reports having long standing white-coat syndrome, with home readings  <140/90.  Prior history: Pseudohypoparathyroidism diagnosed in 1991; he took rocaltrol  for approx 9 years, then changed to Vit D3/Ca++ combined pill, but he does not know the dosage; he was then changed to rocaltrol ).   Current regimen: Calcitriol  0.25 mcg qd Calcium  citrate 600 mg tid   Did not tolerate calcium  carbonate due to side effects   Current symptoms: No numbness/tingling/head ache/abdominal pain/nausea/vomiting/seizures/falls/fractures/bone pains  Recovered from constipation   Physical Exam  Wt (!) 365 lb (165.6 kg)   BMI 52.37 kg/m    Constitutional: well developed, well nourished Head: normocephalic, atraumatic Eyes: sclera anicteric, no redness Neck: supple Lungs: normal respiratory effort Neurology: alert  and oriented Skin: dry, no appreciable rashes Musculoskeletal: no  appreciable defects Psychiatric: normal mood and affect   Current Medications Patient's Medications  New Prescriptions   No medications on file  Previous Medications   ALBUTEROL  (VENTOLIN  HFA) 108 (90 BASE) MCG/ACT INHALER    Inhale 1-2 puffs into the lungs every 6 (six) hours as needed for wheezing or shortness of breath.   AMLODIPINE  (NORVASC ) 10 MG TABLET    Take 1 tablet (10 mg total) by mouth daily.   AMOXICILLIN -CLAVULANATE (AUGMENTIN ) 875-125 MG TABLET    Take 1 tablet by mouth 2 (two) times daily.   ATORVASTATIN  (LIPITOR) 10 MG TABLET    Take 1 tablet (10 mg total) by mouth daily. E11.69   BENZONATATE  (TESSALON ) 100 MG CAPSULE    Take 1-2 capsules (100-200 mg total) by mouth 3 (three) times daily as needed.   BLOOD GLUCOSE MONITORING SUPPL (CONTOUR NEXT MONITOR) W/DEVICE KIT    UAD   CALCITRIOL  (ROCALTROL ) 0.25 MCG CAPSULE    TAKE 1 CAPSULE BY MOUTH EVERY DAY   CALCIUM  CITRATE (CITRACAL PO)    Take 400 mg by mouth 4 (four) times daily.   FLUTICASONE  (FLONASE ) 50 MCG/ACT NASAL SPRAY    Place 2 sprays into both nostrils daily.   GLUCOSE BLOOD TEST STRIP    Use as instructed   LANCETS MISC    UAD   LISINOPRIL  (ZESTRIL ) 40 MG TABLET    Take 1 tablet (40 mg total) by mouth daily.   METFORMIN  (GLUCOPHAGE ) 500 MG TABLET    TAKE 1 TABLET BY MOUTH EVERY DAY WITH BREAKFAST   TIRZEPATIDE  (MOUNJARO ) 2.5 MG/0.5ML PEN    Inject 2.5 mg into the skin once a week.   TRIAMCINOLONE  CREAM (KENALOG ) 0.1 %    APPLY 1 APPLICATION. TOPICALLY 2 (TWO) TIMES DAILY.  Modified Medications   No medications on file  Discontinued Medications   No medications on file    Allergies Allergies  Allergen Reactions   Diltiazem      Body aches, chills   Pollen Extract     Sneezing, headaches    Past Medical History Past Medical History:  Diagnosis Date   Allergy     since birth    Hypertension 2006   first told his BP was high, never treated    Obesity    Pseudohypoparathyroidism     Past Surgical  History History reviewed. No pertinent surgical history.  Family History family history includes Diabetes in his father and mother; Hypertension in his father and paternal grandmother.  Social History Social History   Socioeconomic History   Marital status: Married    Spouse name: Not on file   Number of children: 0   Years of education: Not on file   Highest education level: Not on file  Occupational History   Not on file  Tobacco Use   Smoking status: Never   Smokeless tobacco: Never  Substance and Sexual Activity   Alcohol use: No    Alcohol/week: 0.0 standard drinks of alcohol   Drug use: No    Frequency: 1.0 times per week   Sexual activity: Yes    Partners: Female    Birth control/protection: Condom  Other Topics Concern   Not on file  Social History Narrative   Not on file   Social Drivers of Health   Financial Resource Strain: Low Risk  (10/17/2023)   Overall Financial Resource Strain (CARDIA)    Difficulty of Paying Living Expenses: Not hard at all  Food  Insecurity: No Food Insecurity (10/17/2023)   Hunger Vital Sign    Worried About Running Out of Food in the Last Year: Never true    Ran Out of Food in the Last Year: Never true  Transportation Needs: No Transportation Needs (10/17/2023)   PRAPARE - Administrator, Civil Service (Medical): No    Lack of Transportation (Non-Medical): No  Physical Activity: Inactive (10/17/2023)   Exercise Vital Sign    Days of Exercise per Week: 0 days    Minutes of Exercise per Session: 0 min  Stress: No Stress Concern Present (10/17/2023)   [employer] of Occupational Health - Occupational Stress Questionnaire    Feeling of Stress : Not at all  Social Connections: Socially Integrated (10/17/2023)   Social Connection and Isolation Panel    Frequency of Communication with Friends and Family: More than three times a week    Frequency of Social Gatherings with Friends and Family: Twice a week    Attends  Religious Services: More than 4 times per year    Active Member of Clubs or Organizations: No    Attends Banker Meetings: 1 to 4 times per year    Marital Status: Married  Catering Manager Violence: Not At Risk (10/17/2023)   Humiliation, Afraid, Rape, and Kick questionnaire    Fear of Current or Ex-Partner: No    Emotionally Abused: No    Physically Abused: No    Sexually Abused: No    Lab Results  Component Value Date   CHOL 204 (H) 10/17/2023   Lab Results  Component Value Date   HDL 39 (L) 10/17/2023   Lab Results  Component Value Date   LDLCALC 140 (H) 10/17/2023   Lab Results  Component Value Date   TRIG 137 10/17/2023   Lab Results  Component Value Date   CHOLHDL 5.2 (H) 10/17/2023   Lab Results  Component Value Date   CREATININE 0.92 05/30/2024   Lab Results  Component Value Date   GFR 79.42 12/09/2022      Component Value Date/Time   NA 140 05/30/2024 1211   NA 143 10/17/2023 0941   K 3.5 05/30/2024 1211   CL 99 05/30/2024 1211   CO2 31 05/30/2024 1211   GLUCOSE 92 05/30/2024 1211   BUN 11 05/30/2024 1211   BUN 12 10/17/2023 0941   CREATININE 0.92 05/30/2024 1211   CALCIUM  7.0 (L) 05/30/2024 1211   CALCIUM  7.0 (L) 05/30/2024 1211   PROT 7.3 10/17/2023 0941   ALBUMIN 3.9 (L) 10/17/2023 0941   AST 19 10/17/2023 0941   ALT 24 10/17/2023 0941   ALKPHOS 83 10/17/2023 0941   BILITOT 0.2 10/17/2023 0941   GFRNONAA 86 07/09/2019 0846   GFRNONAA >89 08/26/2014 1459   GFRAA 100 07/09/2019 0846   GFRAA >89 08/26/2014 1459      Latest Ref Rng & Units 05/30/2024   12:11 PM 10/17/2023    9:41 AM 12/09/2022    8:49 AM  BMP  Glucose 65 - 99 mg/dL 92  861  882   BUN 7 - 25 mg/dL 11  12  14    Creatinine 0.60 - 1.29 mg/dL 9.07  8.92  8.86   BUN/Creat Ratio 6 - 22 (calc) SEE NOTE:  11    Sodium 135 - 146 mmol/L 140  143  139   Potassium 3.5 - 5.3 mmol/L 3.5  4.1  3.9   Chloride 98 - 110 mmol/L 99  101  101  CO2 20 - 32 mmol/L 31  25  28     Calcium  8.6 - 10.3 mg/dL 8.6 - 89.6 mg/dL 7.0    7.0  8.1  8.4    8.2        Component Value Date/Time   WBC 9.7 10/17/2023 0941   WBC 13.1 (H) 10/15/2016 2016   RBC 4.60 10/17/2023 0941   RBC 4.31 10/15/2016 2016   HGB 14.0 10/17/2023 0941   HCT 42.2 10/17/2023 0941   PLT 302 10/17/2023 0941   MCV 92 10/17/2023 0941   MCH 30.4 10/17/2023 0941   MCH 29.9 10/15/2016 2016   MCHC 33.2 10/17/2023 0941   MCHC 32.7 10/15/2016 2016   RDW 13.1 10/17/2023 0941   LYMPHSABS 1,887 09/30/2016 1411   MONOABS 444 09/30/2016 1411   EOSABS 111 09/30/2016 1411   BASOSABS 0 09/30/2016 1411   Lab Results  Component Value Date   TSH 3.62 12/07/2022   TSH 3.87 07/15/2021   TSH 2.19 09/30/2016   FREET4 0.82 12/07/2022   FREET4 0.82 07/15/2021     Parts of this note may have been dictated using voice recognition software. There may be variances in spelling and vocabulary which are unintentional. Not all errors are proofread. Please notify the dino if any discrepancies are noted or if the meaning of any statement is not clear.

## 2024-07-20 ENCOUNTER — Telehealth: Admitting: Physician Assistant

## 2024-07-20 DIAGNOSIS — J019 Acute sinusitis, unspecified: Secondary | ICD-10-CM

## 2024-07-20 DIAGNOSIS — B9689 Other specified bacterial agents as the cause of diseases classified elsewhere: Secondary | ICD-10-CM | POA: Diagnosis not present

## 2024-07-20 MED ORDER — DOXYCYCLINE HYCLATE 100 MG PO TABS: 100.0000 mg | ORAL_TABLET | Freq: Two times a day (BID) | ORAL | 0 refills | Status: AC

## 2024-07-20 NOTE — Progress Notes (Signed)
 E-Visit for Sinus Problems  We are sorry that you are not feeling well.  Here is how we plan to help!  Based on what you have shared with me it looks like you have sinusitis.  Sinusitis is inflammation and infection in the sinus cavities of the head.  Based on your presentation I believe you most likely have Acute Bacterial Sinusitis.  This is an infection caused by bacteria and is treated with antibiotics. I have prescribed Doxycycline 100mg  by mouth twice a day for 7 days. You may use an oral decongestant such as Mucinex D or if you have glaucoma or high blood pressure use plain Mucinex. Saline nasal spray help and can safely be used as often as needed for congestion.  If you develop worsening sinus pain, fever or notice severe headache and vision changes, or if symptoms are not better after completion of antibiotic, please schedule an appointment with a health care provider.    Sinus infections are not as easily transmitted as other respiratory infection, however we still recommend that you avoid close contact with loved ones, especially the very young and elderly.  Remember to wash your hands thoroughly throughout the day as this is the number one way to prevent the spread of infection!  Home Care: Only take medications as instructed by your medical team. Complete the entire course of an antibiotic. Do not take these medications with alcohol. A steam or ultrasonic humidifier can help congestion.  You can place a towel over your head and breathe in the steam from hot water coming from a faucet. Avoid close contacts especially the very young and the elderly. Cover your mouth when you cough or sneeze. Always remember to wash your hands.  Get Help Right Away If: You develop worsening fever or sinus pain. You develop a severe head ache or visual changes. Your symptoms persist after you have completed your treatment plan.  Make sure you Understand these instructions. Will watch your  condition. Will get help right away if you are not doing well or get worse.  Your e-visit answers were reviewed by a board certified advanced clinical practitioner to complete your personal care plan.  Depending on the condition, your plan could have included both over the counter or prescription medications.  If there is a problem please reply  once you have received a response from your provider.  Your safety is important to us .  If you have drug allergies check your prescription carefully.    You can use MyChart to ask questions about today's visit, request a non-urgent call back, or ask for a work or school excuse for 24 hours related to this e-Visit. If it has been greater than 24 hours you will need to follow up with your provider, or enter a new e-Visit to address those concerns.  You will get an e-mail in the next two days asking about your experience.  I hope that your e-visit has been valuable and will speed your recovery. Thank you for using e-visits.  I have spent 5 minutes in review of e-visit questionnaire, review and updating patient chart, medical decision making and response to patient.   Peter CHRISTELLA Dickinson, PA-C

## 2024-07-28 ENCOUNTER — Other Ambulatory Visit: Payer: Self-pay | Admitting: "Endocrinology

## 2024-07-28 DIAGNOSIS — E201 Pseudohypoparathyroidism: Secondary | ICD-10-CM

## 2024-07-31 ENCOUNTER — Telehealth: Payer: Self-pay

## 2024-07-31 ENCOUNTER — Other Ambulatory Visit (HOSPITAL_COMMUNITY): Payer: Self-pay

## 2024-07-31 NOTE — Telephone Encounter (Signed)
 Pharmacy Patient Advocate Encounter   Received notification from RX Request Messages that prior authorization for Calcitriol  0.25mcg caps is required/requested.   Insurance verification completed.   The patient is insured through [facility].   Per test claim: The current 90 day co-pay is, $15.  No PA needed at this time. This test claim was processed through [facility]- copay amounts may vary at other pharmacies due to pharmacy/plan contracts, or as the patient moves through the different stages of their insurance plan.

## 2024-08-08 ENCOUNTER — Telehealth: Admitting: Emergency Medicine

## 2024-08-08 DIAGNOSIS — J069 Acute upper respiratory infection, unspecified: Secondary | ICD-10-CM

## 2024-08-08 NOTE — Progress Notes (Signed)
" °  Because you were treated for a sinus infection less than 3 weeks ago, I am not able to help you by evisit and I feel your condition warrants further evaluation and I recommend that you be seen in a face-to-face visit. I am sorry I cannot help you today.    NOTE: There will be NO CHARGE for this E-Visit   If you are having a true medical emergency, please call 911.     For an urgent face to face visit, [facility] has multiple urgent care centers for your convenience.  Click the link below for the full list of locations and hours, walk-in wait times, appointment scheduling options and driving directions:  Urgent Care - [place], [place], [place], [place], [place], [place]  [place]     Your MyChart E-visit questionnaire answers were reviewed by a board certified advanced clinical practitioner to complete your personal care plan based on your specific symptoms.    Thank you for using e-Visits.    "

## 2024-09-14 ENCOUNTER — Ambulatory Visit: Admitting: Internal Medicine
